# Patient Record
Sex: Male | Born: 1960 | Race: White | Hispanic: No | Marital: Married | Smoking: Former smoker
Health system: Southern US, Community
[De-identification: ages and names within clinical notes are randomized; demographics above are authoritative.]

## PROBLEM LIST (undated history)

## (undated) DIAGNOSIS — G4733 Obstructive sleep apnea (adult) (pediatric): Secondary | ICD-10-CM

## (undated) DIAGNOSIS — Z8679 Personal history of other diseases of the circulatory system: Secondary | ICD-10-CM

## (undated) DIAGNOSIS — I471 Supraventricular tachycardia, unspecified: Secondary | ICD-10-CM

## (undated) DIAGNOSIS — I639 Cerebral infarction, unspecified: Secondary | ICD-10-CM

## (undated) DIAGNOSIS — K219 Gastro-esophageal reflux disease without esophagitis: Secondary | ICD-10-CM

## (undated) DIAGNOSIS — I2510 Atherosclerotic heart disease of native coronary artery without angina pectoris: Secondary | ICD-10-CM

## (undated) DIAGNOSIS — I10 Essential (primary) hypertension: Secondary | ICD-10-CM

## (undated) HISTORY — DX: Obstructive sleep apnea (adult) (pediatric): G47.33

## (undated) HISTORY — PX: UMBILICAL HERNIA REPAIR: SHX196

## (undated) HISTORY — DX: Personal history of other diseases of the circulatory system: Z86.79

## (undated) HISTORY — DX: Supraventricular tachycardia, unspecified: I47.10

## (undated) HISTORY — DX: Atherosclerotic heart disease of native coronary artery without angina pectoris: I25.10

## (undated) HISTORY — DX: Essential (primary) hypertension: I10

## (undated) HISTORY — DX: Supraventricular tachycardia: I47.1

---

## 2010-02-23 NOTE — H&P (Signed)
  NAME:  Dean Wilson, Dean Wilson           ACCOUNT NO.:  [id number]  MEDICAL RECORD NO.:  [id number]        PATIENT TYPE:  PAMB  LOCATION:  DAY                           FACILITY:  [facility]  PHYSICIAN:  Dalia Heading, M.D.  DATE OF BIRTH:  Sep 12, 1960  DATE OF ADMISSION: DATE OF DISCHARGE:  LH                             HISTORY & PHYSICAL   CHIEF COMPLAINT:  Umbilical hernia.  HISTORY OF PRESENT ILLNESS:  The patient is a 50 year old white male who is referred for evaluation and treatment of umbilical hernia.  It has been present for several months, but is increasing in size and is causing him discomfort.  It is made worse with straining.  PAST MEDICAL HISTORY:  Includes hypertension and arthritis.  PAST SURGICAL HISTORY:  Unremarkable.  CURRENT MEDICATIONS: 1. Bystolic 10 mg p.o. daily. 2. Cardizem LA 360 mg p.o. daily. 3. Viagra p.r.n. 4. Relafen p.r.n. 5. Methylprednisolone injections p.r.n.  ALLERGIES:  No known drug allergies.  REVIEW OF SYSTEMS:  The patient denies drinking or smoking.  Denies any other cardiopulmonary difficulties or bleeding disorders.  PHYSICAL EXAMINATION:  GENERAL:  The patient is an overweight white male in no acute distress. HEENT:  Unremarkable. LUNGS:  Clear to auscultation with equal breath sounds bilaterally. HEART:  Reveals regular rate and rhythm without S3, S4, or murmurs. ABDOMEN:  Soft, nontender, nondistended.  A reducible umbilical hernia is seen.  No hepatosplenomegaly or masses are noted.  IMPRESSION:  Umbilical hernia.  PLAN:  The patient is scheduled for an umbilical herniorrhaphy with mesh on March 03, 2010.  The risks and benefits of the procedure including bleeding, infection, the possibility of recurrence of the hernia were fully explained to the patient, gave informed consent.     Dalia Heading, M.D.     MAJ/MEDQ  D:  02/21/2010  T:  02/22/2010  Job:  [id number]  cc:   Short Stay, [facility]  Doreen Beam, MD Fax:  [phone]  Electronically Signed by Franky Macho M.D. on 02/23/2010 12:14:28 PM

## 2010-02-28 LAB — BASIC METABOLIC PANEL
CO2: 24 mEq/L (ref 19–32)
Calcium: 9.1 mg/dL (ref 8.4–10.5)
Chloride: 96 mEq/L (ref 96–112)
Creatinine, Ser: 1.27 mg/dL (ref 0.4–1.5)
GFR calc Af Amer: >60 mL/min (ref >60)
Sodium: 132 mEq/L — ABNORMAL LOW (ref 135–145)

## 2010-02-28 LAB — SURGICAL PCR SCREEN
MRSA, PCR: NEGATIVE
Staphylococcus aureus: NEGATIVE

## 2010-02-28 LAB — CBC
Hemoglobin: 15.1 g/dL (ref 13.0–17.0)
MCH: 28.5 pg (ref 26.0–34.0)
Platelets: 266 10*3/uL (ref 150–400)
RBC: 5.3 MIL/uL (ref 4.22–5.81)
WBC: 8.3 10*3/uL (ref 4.0–10.5)

## 2010-03-03 ENCOUNTER — Ambulatory Visit (HOSPITAL_COMMUNITY)
Admission: RE | Admit: 2010-03-03 | Discharge: 2010-03-03 | Payer: Self-pay | Source: Home / Self Care | Attending: General Surgery | Admitting: General Surgery

## 2010-03-07 NOTE — Op Note (Signed)
  NAME:  Dean Wilson, Dean Wilson           ACCOUNT NO.:  [id number]  MEDICAL RECORD NO.:  [id number]          PATIENT TYPE:  AMB  LOCATION:  DAY                           FACILITY:  [facility]  PHYSICIAN:  Dalia Heading, M.D.  DATE OF BIRTH:  Feb 06, 1961  DATE OF PROCEDURE:  03/03/2010 DATE OF DISCHARGE:                              OPERATIVE REPORT   PREOPERATIVE DIAGNOSIS:  Umbilical hernia.  POSTOPERATIVE DIAGNOSIS:  Umbilical hernia.  PROCEDURE:  Umbilical herniorrhaphy with mesh.  SURGEON:  Dalia Heading, MD  ANESTHESIA:  General endotracheal.  INDICATIONS:  The patient is a 50 year old white male presents with symptomatic umbilical hernia.  The risks and benefits of the procedure including bleeding, infection, and the possibly recurrence of the hernia were fully explained to the patient, gave informed consent.  PROCEDURE NOTE:  The patient was placed in the supine position.  After induction of general endotracheal anesthesia, the abdomen was prepped and draped using the usual sterile technique with ChloraPrep.  Surgical site confirmation was performed.  An infraumbilical incision was made down to the fascia.  The umbilicus was freed away from the underlying hernia.  Omentum was noted within the defect.  A portion of the omentum was excised in order to facilitate reduction.  The remaining omentum was reduced.  A Proceed ventral patch was then inserted and secured to the fascia using 0 Ethibond interrupted sutures.  Some loose overlying fascia was also reapproximated over the repair using 0 Ethibond interrupted sutures.  The base of the umbilicus was secured back to the fascia using a 2-0 Vicryl interrupted suture. The subcutaneous layer was reapproximated using a 3-0 Vicryl interrupted suture.  The skin was closed using staples.  A 0.5% Sensorcaine was instilled in the surrounding wound.  Bacitracin ointment was then applied.  All tape and needle counts were correct at the end  of the procedure. The patient was extubated in the operating room and went back to recovery room awake in stable condition.  Complications none.  SPECIMEN:  None.  BLOOD LOSS:  Minimal.     Dalia Heading, M.D.     MAJ/MEDQ  D:  03/03/2010  T:  03/03/2010  Job:  [id number]  cc:   Dr. Barnett Abu, [facility]  Electronically Signed by Franky Macho M.D. on 03/07/2010 09:16:44 AM

## 2011-01-03 ENCOUNTER — Other Ambulatory Visit (INDEPENDENT_AMBULATORY_CARE_PROVIDER_SITE_OTHER): Payer: Self-pay | Admitting: *Deleted

## 2011-01-03 ENCOUNTER — Encounter (INDEPENDENT_AMBULATORY_CARE_PROVIDER_SITE_OTHER): Payer: Self-pay | Admitting: *Deleted

## 2011-01-03 DIAGNOSIS — Z800 Family history of malignant neoplasm of digestive organs: Secondary | ICD-10-CM

## 2011-01-03 DIAGNOSIS — K219 Gastro-esophageal reflux disease without esophagitis: Secondary | ICD-10-CM

## 2011-01-03 DIAGNOSIS — Z1211 Encounter for screening for malignant neoplasm of colon: Secondary | ICD-10-CM

## 2011-01-08 ENCOUNTER — Telehealth (INDEPENDENT_AMBULATORY_CARE_PROVIDER_SITE_OTHER): Payer: Self-pay | Admitting: *Deleted

## 2011-01-08 NOTE — Telephone Encounter (Signed)
PCP/Requesting MD:  VYAS  Name: CLIMMIE BUELOW  DOB: 12/19/2060  Home Phone: [phone]      Procedure: TCS/EGD/ED  Reason/Indication:  SCREENING, + FH CRC, DYSPHAGIA, GERD  Has patient had this procedure before?  NO  If so, when, by whom and where?    Is there a family history of colon cancer?  YES  Who?  What age when diagnosed?  UNCLE  Is patient diabetic?   NO      Does patient have prosthetic heart valve?  NO  Do you have a pacemaker?  NO  Has patient had joint replacement within last 12 months?  NO  Is patient on Coumadin, Plavix and/or Aspirin? NO  Medications: OMEPRAZOLE 40 MG DAILY, CARDIZEM 360 MG DAILY, BYSTOLIC 10 MG DAILY, VIAGRA 50 MG  Allergies: NKDA  Pharmacy:   Medication Adjustment:   Procedure date & time: 01/26/11 @ 9:30

## 2011-01-08 NOTE — Telephone Encounter (Signed)
Agree 

## 2011-01-15 ENCOUNTER — Encounter (HOSPITAL_COMMUNITY): Payer: Self-pay | Admitting: Pharmacy Technician

## 2011-01-25 MED ORDER — SODIUM CHLORIDE 0.45 % IV SOLN
Freq: Once | INTRAVENOUS | Status: AC
  Administered 2011-01-26: 09:00:00 via INTRAVENOUS

## 2011-01-26 ENCOUNTER — Other Ambulatory Visit (INDEPENDENT_AMBULATORY_CARE_PROVIDER_SITE_OTHER): Payer: Self-pay | Admitting: Internal Medicine

## 2011-01-26 ENCOUNTER — Ambulatory Visit (HOSPITAL_COMMUNITY)
Admission: RE | Admit: 2011-01-26 | Discharge: 2011-01-26 | Disposition: A | Discharge status: Home / Self Care | Payer: BC Managed Care – PPO | Source: Ambulatory Visit | Attending: Internal Medicine | Admitting: Internal Medicine

## 2011-01-26 ENCOUNTER — Encounter (HOSPITAL_COMMUNITY): Payer: Self-pay

## 2011-01-26 ENCOUNTER — Encounter (HOSPITAL_COMMUNITY)
Admission: RE | Disposition: A | Discharge status: Home / Self Care | Payer: Self-pay | Source: Ambulatory Visit | Attending: Internal Medicine

## 2011-01-26 DIAGNOSIS — Z800 Family history of malignant neoplasm of digestive organs: Secondary | ICD-10-CM

## 2011-01-26 DIAGNOSIS — R1310 Dysphagia, unspecified: Secondary | ICD-10-CM | POA: Insufficient documentation

## 2011-01-26 DIAGNOSIS — K222 Esophageal obstruction: Secondary | ICD-10-CM

## 2011-01-26 DIAGNOSIS — D126 Benign neoplasm of colon, unspecified: Secondary | ICD-10-CM | POA: Insufficient documentation

## 2011-01-26 DIAGNOSIS — K2970 Gastritis, unspecified, without bleeding: Secondary | ICD-10-CM

## 2011-01-26 DIAGNOSIS — D131 Benign neoplasm of stomach: Secondary | ICD-10-CM

## 2011-01-26 DIAGNOSIS — K2990 Gastroduodenitis, unspecified, without bleeding: Secondary | ICD-10-CM

## 2011-01-26 DIAGNOSIS — Z1211 Encounter for screening for malignant neoplasm of colon: Secondary | ICD-10-CM

## 2011-01-26 DIAGNOSIS — I10 Essential (primary) hypertension: Secondary | ICD-10-CM | POA: Insufficient documentation

## 2011-01-26 DIAGNOSIS — K2270 Barrett's esophagus without dysplasia: Secondary | ICD-10-CM

## 2011-01-26 DIAGNOSIS — K219 Gastro-esophageal reflux disease without esophagitis: Secondary | ICD-10-CM

## 2011-01-26 DIAGNOSIS — Z79899 Other long term (current) drug therapy: Secondary | ICD-10-CM | POA: Insufficient documentation

## 2011-01-26 DIAGNOSIS — K5730 Diverticulosis of large intestine without perforation or abscess without bleeding: Secondary | ICD-10-CM | POA: Insufficient documentation

## 2011-01-26 HISTORY — DX: Gastro-esophageal reflux disease without esophagitis: K21.9

## 2011-01-26 SURGERY — COLONOSCOPY WITH ESOPHAGOGASTRODUODENOSCOPY (EGD)
Anesthesia: Moderate Sedation

## 2011-01-26 MED ORDER — PANTOPRAZOLE SODIUM 40 MG PO TBEC: 40.0000 mg | DELAYED_RELEASE_TABLET | Freq: Every day | ORAL | Status: AC

## 2011-01-26 MED ORDER — MEPERIDINE HCL 50 MG/ML IJ SOLN
INTRAMUSCULAR | Status: AC
  Filled 2011-01-26: qty 1

## 2011-01-26 MED ORDER — MIDAZOLAM HCL 5 MG/5ML IJ SOLN
INTRAMUSCULAR | Status: DC | PRN
  Administered 2011-01-26 (×4): 2 mg via INTRAVENOUS

## 2011-01-26 MED ORDER — MIDAZOLAM HCL 5 MG/5ML IJ SOLN
INTRAMUSCULAR | Status: AC
  Filled 2011-01-26: qty 10

## 2011-01-26 MED ORDER — BUTAMBEN-TETRACAINE-BENZOCAINE 2-2-14 % EX AERO
INHALATION_SPRAY | CUTANEOUS | Status: DC | PRN
  Administered 2011-01-26: 2 via TOPICAL

## 2011-01-26 MED ORDER — STERILE WATER FOR IRRIGATION IR SOLN
Status: DC | PRN
  Administered 2011-01-26: 10:00:00

## 2011-01-26 MED ORDER — MEPERIDINE HCL 50 MG/ML IJ SOLN
INTRAMUSCULAR | Status: DC | PRN
  Administered 2011-01-26 (×2): 25 mg via INTRAVENOUS

## 2011-01-26 NOTE — Op Note (Addendum)
EGD PROCEDURE REPORT  PATIENT:  Dean Wilson  MR#:  [id number] Birthdate:  02-14-1960, 50 y.o., male Endoscopist:  Dr. Malissa Hippo, MD Referred By:  Dr. Ignatius Specking, MD Procedure Date: 01/26/2011  Procedure:   EGD,ED & Colonoscopy  Indications:  Patient is 50 year old Caucasian male with chronic GERD who presents for solid food dysphagia 7-8 months. He is also undergoing screening colonoscopy. Family history is positive for colon carcinoma in a second-degree relative.            Informed Consent:  Procedures and risks were reviewed with informed consent. Medications:  Demerol 50 mg IV Versed 8 mg IV Cetacaine spray topically for oropharyngeal anesthesia  EGD  Description of procedure:  The endoscope was introduced through the mouth and advanced to the second portion of the duodenum without difficulty or limitations. The mucosal surfaces were surveyed very carefully during advancement of the scope and upon withdrawal.  Findings:  Esophagus:  Mucosa of the proximal segment was normal. Scarring noted 2 mucosa at body. 3 cm long tubular Barrett's mucosa with proximal margin at 37 cm from the incisors. Tubular stricture involving distal 2 cm of the esophagus. GE junction at 40 cm and hiatus at 42. Small polyp noted involving herniated part of the stomach. GEJ:  40 cm Hiatus:  42 cm Stomach:  Stomach was empty and distended very well with insufflation. Folds in the proximal stomach were normal. Examination of mucosa revealed patchy erythema at antrum in edema to prepyloric folds. Angularis fundus and cardia were examined by retroflexing the scope and were normal. Pyloric channel was patent. Duodenum:  Bulbar and post bulbar mucosa was normal.  Therapeutic/Diagnostic Maneuvers Performed:  Distal esophageal stricture was dilated with a balloon from 15-16.5 mm resulting in mucosal disruption.Polyp was biopsied for histology. Biopsy was also taken from Barrett's esophagus.   COLONOSCOPY Description of procedure:  After a digital rectal exam was performed, that colonoscope was advanced from the anus through the rectum and colon to the area of the cecum, ileocecal valve and appendiceal orifice. The cecum was deeply intubated. These structures were well-seen and photographed for the record. From the level of the cecum and ileocecal valve, the scope was slowly and cautiously withdrawn. The mucosal surfaces were carefully surveyed utilizing scope tip to flexion to facilitate fold flattening as needed. The scope was pulled down into the rectum where a thorough exam including retroflexion was performed.  Findings:   Prep excellent. Few small diverticula at sigmoid colon. 3 mm polyp ablated via cold biopsy from proximal sigmoid colon. Unremarkable anorectal junction.  Therapeutic/Diagnostic Maneuvers Performed:  see above  Complications:  None  Cecal Withdrawal Time:  8 minutes  Impression:  3 cm long tubular Barrett's. Biopsy  taken for histology. 2 cm long distal esophageal stricture dilated with a balloon to 16.5 mm. Small polyp involving herniated part of the stomach biopsy taken for histology. Antral gastritis. Few small diverticula at sigmoid colon. 3 mm polyp ablated via cold biopsy from sigmoid colon. Recommendations:  Anti-reflux measures reinforced. Pantoprazole 40 mg by mouth every morning Patient must lose weight. Audi contacting patient with results of biopsy and further recommendations to  REHMAN,NAJEEB U  01/26/2011 10:12 AM  CC: Dr. No primary provider on file. & Dr. No ref. provider found

## 2011-01-26 NOTE — H&P (Signed)
Dean Wilson is an 50 y.o. male.   Chief Complaint: Patient is here for EGD, ED and colonoscopy. HPI: Patient is 50 year old Caucasian male was in experienced dysphagia to solids for the last 7-8 months. He's been on therapy for GERD for about 3 years and  heartburn has been well controlled. He denies melena or rectal bleeding or change in his bowel habits. History is positive for colon carcinoma in paternal father in his 98s he does not have any other family member CRC   Past Medical History  Diagnosis Date  . Dysrhythmia   . Hypertension     Past Surgical History  Procedure Date  . Hernia repair     umbilical hernia repair    Family History  Problem Relation Age of Onset  . Anesthesia problems Neg Hx   . Hypotension Neg Hx   . Malignant hyperthermia Neg Hx   . Pseudochol deficiency Neg Hx    Social History:  reports that he quit smoking about 3 years ago. His smoking use included Cigarettes. He has a 50 pack-year smoking history. He does not have any smokeless tobacco history on file. He reports that he does not drink alcohol or use illicit drugs.  Allergies: No Known Allergies  Medications Prior to Admission  Medication Dose Route Frequency Provider Last Rate Last Dose  . 0.45 % sodium chloride infusion   Intravenous Once Malissa Hippo, MD 20 mL/hr at 01/26/11 0855    . meperidine (DEMEROL) 50 MG/ML injection           . midazolam (VERSED) 5 MG/5ML injection            Medications Prior to Admission  Medication Sig Dispense Refill  . diltiazem (TIAZAC) 360 MG 24 hr capsule Take 360 mg by mouth daily.        . piroxicam (FELDENE) 20 MG capsule Take 20 mg by mouth daily.          No results found for this or any previous visit (from the past 48 hour(s)). No results found.  Review of Systems  Constitutional: Negative for weight loss.  Gastrointestinal: Negative for nausea, vomiting, abdominal pain, diarrhea, constipation, blood in stool and melena.    Blood  pressure 148/94, pulse 64, temperature 98.2 F (36.8 C), temperature source Oral, resp. rate 20, height 6' (1.829 m), weight 240 lb (108.863 kg), SpO2 95.00%. Physical Exam  Constitutional: He appears well-developed and well-nourished.  Eyes: Conjunctivae are normal. No scleral icterus.  Neck: No thyromegaly present.  Cardiovascular: Normal rate, regular rhythm and normal heart sounds.   No murmur heard. Respiratory: Effort normal and breath sounds normal.  GI: Soft. He exhibits no distension and no mass. There is no tenderness.  Musculoskeletal: He exhibits no edema.  Lymphadenopathy:    He has no cervical adenopathy.  Neurological: He is alert.  Skin: Skin is warm and dry.     Assessment/Plan Solid food dysphagia in a patient with history of chronic GERD. EGD, ED and Screening colonoscopy  Jemarion Roycroft U 01/26/2011, 9:30 AM

## 2011-02-08 ENCOUNTER — Telehealth (INDEPENDENT_AMBULATORY_CARE_PROVIDER_SITE_OTHER): Payer: Self-pay | Admitting: *Deleted

## 2011-02-08 NOTE — Telephone Encounter (Signed)
Per Dr. Karilyn Cota the patient should be on a bland diet, no fried foods, use anti reflux measures. Take the Pantoprazole 40 mg twice a day. Dr. Karilyn Cota plans to repeat EGD/ED  in February 2013. The patient was called and made aware.Dr. Karilyn Cota did say that if the patient need Pantoprazole we could call in the following: Pantoprazole 40 mg Take 1 by mouth twice a day #60  - 6 refills.

## 2011-02-08 NOTE — Telephone Encounter (Signed)
Patient called me this morning stating that last evening he had a episode worse than he ever has had.  He ate a whopper and fries with some ketchup last evening.  Went to bed and woke up with terrible reflux and a feeling he like he  could not breath.  Had a terrible taste from reflux in his mouth.  He said he felt his swallowing was somewhat difficult but no like before where he would feel he would need to throw up.  He said he needed relief so he did disolve baking soda in some water and did get some relief and was then able to go back to sleep afterwards.    He did not have any abdominal pain or vomiting.  nkda and uses [facility]

## 2011-02-08 NOTE — Telephone Encounter (Signed)
Per the patient he is  only been taking the PPI once a day. I called a new prescription into [facility]/ [place] as noted in previous telephone encounter.

## 2011-02-08 NOTE — Telephone Encounter (Signed)
Noted, I will address with Dr. Karilyn Cota and then call the patient back.

## 2011-02-09 ENCOUNTER — Encounter (INDEPENDENT_AMBULATORY_CARE_PROVIDER_SITE_OTHER): Payer: Self-pay | Admitting: *Deleted

## 2011-04-03 ENCOUNTER — Encounter (INDEPENDENT_AMBULATORY_CARE_PROVIDER_SITE_OTHER): Payer: Self-pay | Admitting: *Deleted

## 2016-02-06 ENCOUNTER — Encounter (HOSPITAL_COMMUNITY): Payer: Self-pay | Admitting: Emergency Medicine

## 2016-02-06 ENCOUNTER — Emergency Department (HOSPITAL_COMMUNITY): Payer: BLUE CROSS/BLUE SHIELD

## 2016-02-06 ENCOUNTER — Emergency Department (HOSPITAL_COMMUNITY)
Admission: EM | Admit: 2016-02-06 | Discharge: 2016-02-06 | Disposition: A | Discharge status: Home / Self Care | Payer: BLUE CROSS/BLUE SHIELD | Attending: Emergency Medicine | Admitting: Emergency Medicine

## 2016-02-06 DIAGNOSIS — Z79899 Other long term (current) drug therapy: Secondary | ICD-10-CM | POA: Insufficient documentation

## 2016-02-06 DIAGNOSIS — I10 Essential (primary) hypertension: Secondary | ICD-10-CM | POA: Insufficient documentation

## 2016-02-06 DIAGNOSIS — Z87891 Personal history of nicotine dependence: Secondary | ICD-10-CM | POA: Insufficient documentation

## 2016-02-06 DIAGNOSIS — I471 Supraventricular tachycardia: Secondary | ICD-10-CM

## 2016-02-06 DIAGNOSIS — R079 Chest pain, unspecified: Secondary | ICD-10-CM | POA: Diagnosis present

## 2016-02-06 LAB — CBC WITH DIFFERENTIAL/PLATELET
Basophils Absolute: 0.1 10*3/uL (ref 0.0–0.1)
Basophils Relative: 1 %
Eosinophils Absolute: 0.3 10*3/uL (ref 0.0–0.7)
Eosinophils Relative: 2 %
HCT: 49.9 % (ref 39.0–52.0)
Hemoglobin: 16.4 g/dL (ref 13.0–17.0)
Lymphocytes Relative: 33 %
Lymphs Abs: 4 10*3/uL (ref 0.7–4.0)
MCH: 28.4 pg (ref 26.0–34.0)
MCHC: 32.9 g/dL (ref 30.0–36.0)
MCV: 86.5 fL (ref 78.0–100.0)
Monocytes Absolute: 1.1 10*3/uL — ABNORMAL HIGH (ref 0.1–1.0)
Monocytes Relative: 10 %
Neutro Abs: 6.4 10*3/uL (ref 1.7–7.7)
Neutrophils Relative %: 54 %
Platelets: 275 10*3/uL (ref 150–400)
RBC: 5.77 MIL/uL (ref 4.22–5.81)
RDW: 15.3 % (ref 11.5–15.5)
WBC: 11.8 10*3/uL — ABNORMAL HIGH (ref 4.0–10.5)

## 2016-02-06 LAB — BASIC METABOLIC PANEL
Anion gap: 13 (ref 5–15)
BUN: 25 mg/dL — ABNORMAL HIGH (ref 6–20)
CO2: 30 mmol/L (ref 22–32)
Calcium: 9.3 mg/dL (ref 8.9–10.3)
Chloride: 94 mmol/L — ABNORMAL LOW (ref 101–111)
Creatinine, Ser: 1.48 mg/dL — ABNORMAL HIGH (ref 0.61–1.24)
GFR calc Af Amer: 59 mL/min — ABNORMAL LOW (ref >60)
GFR calc non Af Amer: 51 mL/min — ABNORMAL LOW (ref >60)
Glucose, Bld: 153 mg/dL — ABNORMAL HIGH (ref 65–99)
Potassium: 2.8 mmol/L — ABNORMAL LOW (ref 3.5–5.1)
Sodium: 137 mmol/L (ref 135–145)

## 2016-02-06 LAB — TROPONIN I: Troponin I: <0.03 ng/mL (ref <0.03)

## 2016-02-06 IMAGING — CR DG CHEST 1V PORT
1 series · 1 of 1 positions shown · non-contrast
Comparison: [DATE]

CLINICAL DATA: hx of a-fib and HTN,---sudden onset, chest pain
which began around 1 hour ago. Pt states he was about to make
breakfast when he suddenly felt a sharp pain to his chest. Pt also
reports diaphoresis and SOB.

EXAM:
PORTABLE CHEST 1 VIEW

[portable]
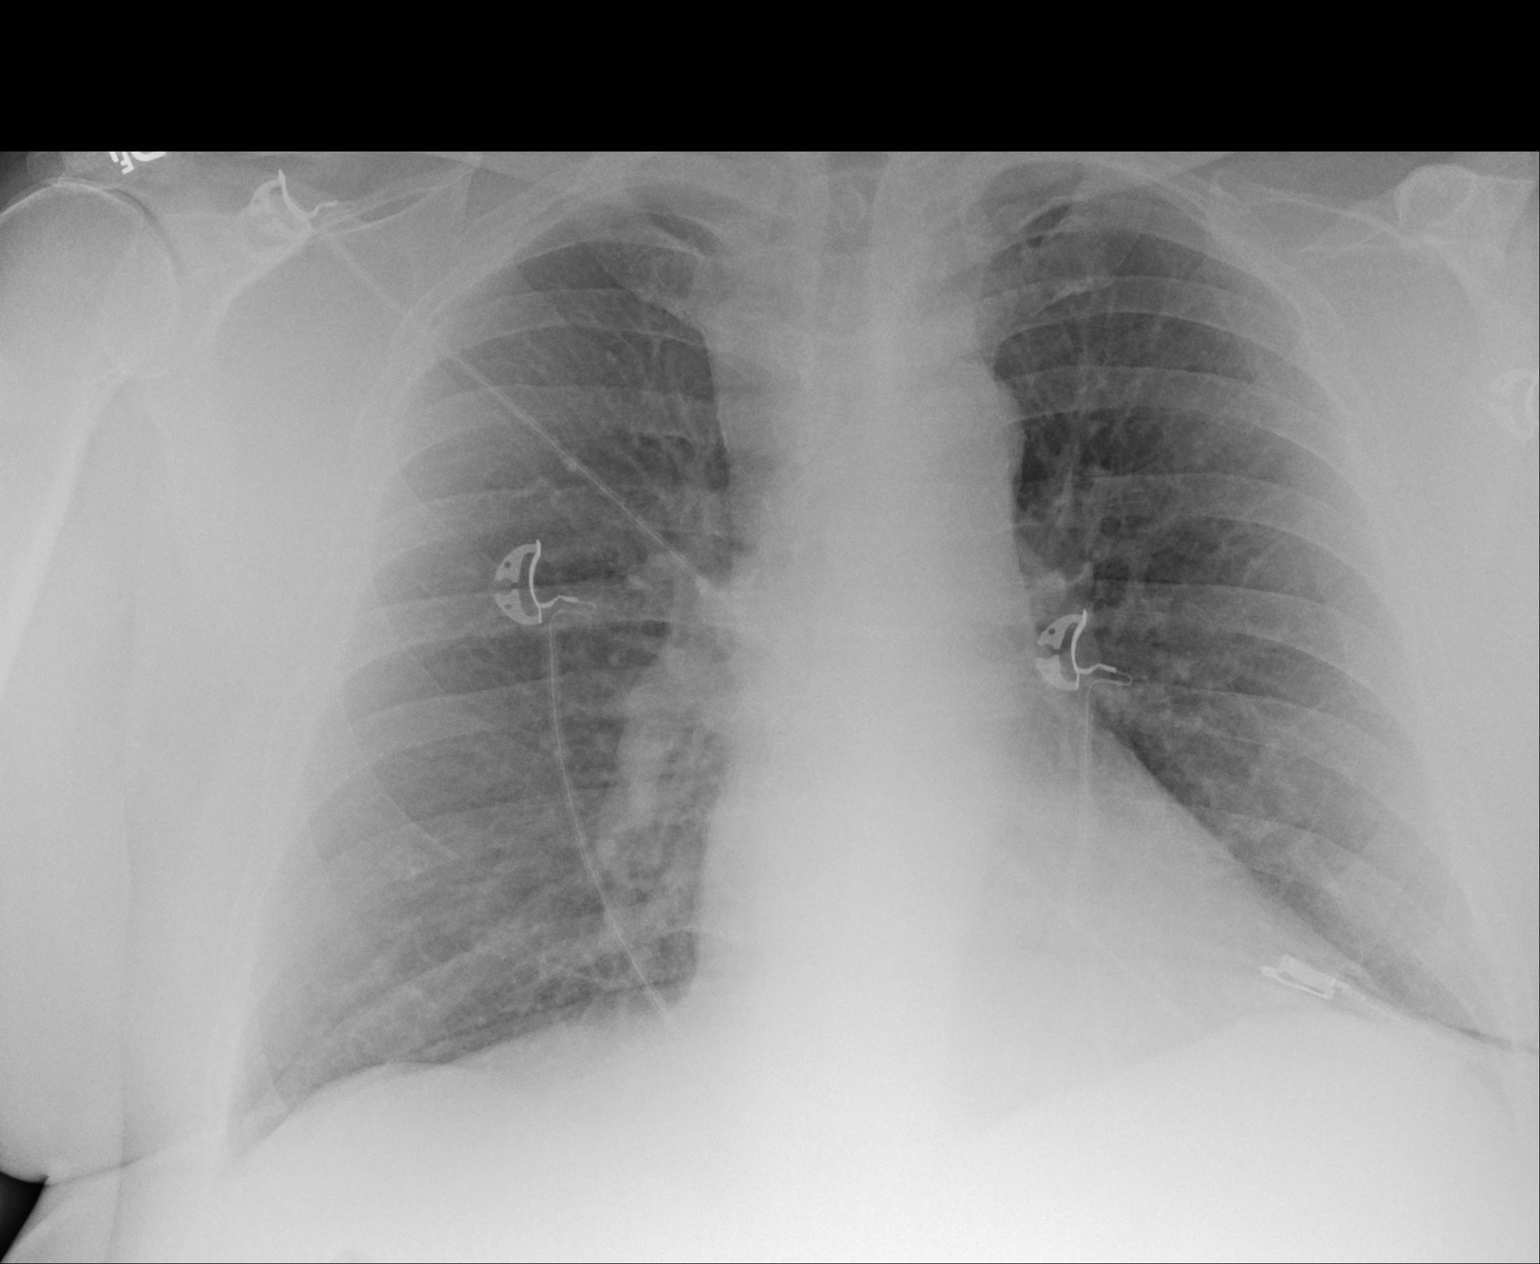

[1 of 1 positions shown; findings below may reference images not displayed]

FINDINGS: Heart size is normal and accentuated by the portable AP technique.
Lungs are free of focal consolidations and pleural effusions. No
pulmonary edema.
IMPRESSION: No active disease.

## 2016-02-06 MED ORDER — POTASSIUM CHLORIDE CRYS ER 20 MEQ PO TBCR
60.0000 meq | EXTENDED_RELEASE_TABLET | Freq: Once | ORAL | Status: AC
  Administered 2016-02-06: 60 meq via ORAL
  Filled 2016-02-06: qty 3

## 2016-02-06 MED ORDER — MAGNESIUM OXIDE 400 (241.3 MG) MG PO TABS
800.0000 mg | ORAL_TABLET | Freq: Once | ORAL | Status: AC
  Administered 2016-02-06: 800 mg via ORAL
  Filled 2016-02-06: qty 2

## 2016-02-06 MED ORDER — ADENOSINE 6 MG/2ML IV SOLN
INTRAVENOUS | Status: AC
  Administered 2016-02-06: 6 mg
  Filled 2016-02-06: qty 6

## 2016-02-06 NOTE — ED Notes (Signed)
Pt denies any sx a tthis time. States he feels a lot better.

## 2016-02-06 NOTE — ED Triage Notes (Signed)
Was fixing breakfast and had sudden sharp pain to L chest radiating down L arm. Has been constant with sob. Sob noted. Pt states he was sweating. nondiaphoretc at this time. A/o

## 2016-02-06 NOTE — ED Provider Notes (Signed)
[facility] DEPT Provider Note   CSN: [id number] Arrival date & time: 02/06/16  0825   By signing my name below, I, Delton Prairie, attest that this documentation has been prepared under the direction and in the presence of Virgel Manifold, MD  Electronically Signed: Delton Prairie, ED Scribe. 02/06/16. 8:56 AM.  History   Chief Complaint Chief Complaint  Patient presents with  . Chest Pain   The history is provided by the patient. No language interpreter was used.    HPI Comments:  Dean Wilson is a 56 y.o. male, with a hx of a-fib and HTN, who presents to the Emergency Department complaining of sudden onset, chest pain which began around 1 hour ago. Pt states he was about to make breakfast when he suddenly felt a sharp pain to his chest. Pt also reports diaphoresis and SOB. He has been to the ED about 2 times with similar pain. Pt states he took his medication in the AM today. No alleviating factors noted. Pt denies a hx of cardiac catheterizations, any other associated symptoms and any other modifying factors at this time.    Past Medical History:  Diagnosis Date  . Dysrhythmia   . GERD (gastroesophageal reflux disease)   . Hypertension     Patient Active Problem List   Diagnosis Date Noted  . Gastritis 01/26/2011    Past Surgical History:  Procedure Laterality Date  . HERNIA REPAIR     umbilical hernia repair     Home Medications    Prior to Admission medications   Medication Sig Start Date End Date Taking? Authorizing Provider  diltiazem (TIAZAC) 360 MG 24 hr capsule Take 360 mg by mouth daily.      Historical Provider, MD  pantoprazole (PROTONIX) 40 MG tablet Take 1 tablet (40 mg total) by mouth daily. 01/26/11 01/26/12  Rogene Houston, MD  piroxicam (FELDENE) 20 MG capsule Take 20 mg by mouth daily.      Historical Provider, MD    Family History Family History  Problem Relation Age of Onset  . Anesthesia problems Neg Hx   . Hypotension Neg Hx   .  Malignant hyperthermia Neg Hx   . Pseudochol deficiency Neg Hx     Social History Social History  Substance Use Topics  . Smoking status: Former Smoker    Packs/day: 2.00    Years: 25.00    Types: Cigarettes    Quit date: 01/26/2008  . Smokeless tobacco: Never Used  . Alcohol use No     Allergies   Patient has no known allergies.   Review of Systems Review of Systems  Constitutional: Positive for diaphoresis.  Respiratory: Positive for shortness of breath.   Cardiovascular: Positive for chest pain.  All other systems reviewed and are negative.  Physical Exam Updated Vital Signs BP 104/85 (BP Location: Right Arm)   Pulse 86   Temp 98 F (36.7 C) (Oral)   Resp 14   Ht 6' (1.829 m)   Wt 270 lb (122.5 kg)   SpO2 100%   BMI 36.62 kg/m   Physical Exam  Constitutional: He is oriented to person, place, and time. He appears well-developed and well-nourished. No distress.  Appears mildly uncomfortable   HENT:  Head: Normocephalic and atraumatic.  Eyes: Conjunctivae are normal.  Cardiovascular: Tachycardia present.   Pulmonary/Chest: Effort normal.  Abdominal: He exhibits no distension.  Neurological: He is alert and oriented to person, place, and time.  Skin: Skin is warm and dry.  Psychiatric: He has a normal mood and affect.  Nursing note and vitals reviewed.   ED Treatments / Results  DIAGNOSTIC STUDIES:  Oxygen Saturation is 99% on RA, normal by my interpretation.    COORDINATION OF CARE:  8:50 AM Discussed treatment plan with pt at bedside and pt agreed to plan.  Labs (all labs ordered are listed, but only abnormal results are displayed) Labs Reviewed  CBC WITH DIFFERENTIAL/PLATELET - Abnormal; Notable for the following:       Result Value   WBC 11.8 (*)    Monocytes Absolute 1.1 (*)    All other components within normal limits  BASIC METABOLIC PANEL - Abnormal; Notable for the following:    Potassium 2.8 (*)    Chloride 94 (*)    Glucose, Bld  153 (*)    BUN 25 (*)    Creatinine, Ser 1.48 (*)    GFR calc non Af Amer 51 (*)    GFR calc Af Amer 59 (*)    All other components within normal limits  TROPONIN I    EKG  EKG Interpretation  Date/Time:  Monday February 06 2016 08:47:07 EST Ventricular Rate:  86 PR Interval:    QRS Duration: 102 QT Interval:  364 QTC Calculation: 436 R Axis:   89 Text Interpretation:  Sinus rhythm Atrial premature complex Sinus pause ST depr, consider ischemia, inferior leads Confirmed by Wilson Singer  MD, Krisa Blattner [id number]) on 02/06/2016 9:00:47 AM       Radiology No results found.  Procedures Procedures (including critical care time)  Medications Ordered in ED Medications  adenosine (ADENOCARD) 6 MG/2ML injection (6 mg  Given 02/06/16 0845)  potassium chloride SA (K-DUR,KLOR-CON) CR tablet 60 mEq (60 mEq Oral Given 02/06/16 0935)  magnesium oxide (MAG-OX) tablet 800 mg (800 mg Oral Given 02/06/16 0955)     Initial Impression / Assessment and Plan / ED Course  I have reviewed the triage vital signs and the nursing notes.  Pertinent labs & imaging results that were available during my care of the patient were reviewed by me and considered in my medical decision making (see chart for details).  Clinical Course     55yM with SVT. Converted with adenosine. Symptoms resolved. Potassium supplemented. Outpt cardiology FU. It has been determined that no acute conditions requiring further emergency intervention are present at this time. The patient has been advised of the diagnosis and plan. I reviewed any labs and imaging including any potential incidental findings. We have discussed signs and symptoms that warrant return to the ED and they are listed in the discharge instructions.    Final Clinical Impressions(s) / ED Diagnoses   Final diagnoses:  SVT (supraventricular tachycardia) (HCC)    New Prescriptions New Prescriptions   No medications on file    I personally preformed the services scribed  in my presence. The recorded information has been reviewed is accurate. Virgel Manifold, MD.     Virgel Manifold, MD 02/14/16 [phone]

## 2016-02-06 NOTE — ED Notes (Signed)
edp in and advised pt to bare down

## 2016-02-06 NOTE — ED Notes (Signed)
Pharmacy aware needing magnesium

## 2016-03-09 ENCOUNTER — Encounter: Payer: Self-pay | Admitting: *Deleted

## 2016-03-12 ENCOUNTER — Encounter: Payer: Self-pay | Admitting: Cardiology

## 2016-03-12 ENCOUNTER — Ambulatory Visit: Payer: Self-pay | Admitting: Cardiology

## 2016-03-12 NOTE — Progress Notes (Deleted)
Cardiology Office Note  Date: 03/12/2016   ID: Dean Wilson, DOB 11/04/1960, MRN [id number]  PCP: Glenda Chroman, MD  Consulting Cardiologist: Rozann Lesches, MD   No chief complaint on file.   History of Present Illness: Dean Wilson is a 56 y.o. male referred for cardiology consultation by Dr. Woody Seller. Records indicate ER evaluation in January secondary to symptomatic SVT that converted to sinus rhythm with adenosine per ER staff. He was incidentally noted to be hypokalemic at the time on a diuretic. Medications have been subsequently adjusted.  Past Medical History:  Diagnosis Date  . Essential hypertension   . GERD (gastroesophageal reflux disease)   . PSVT (paroxysmal supraventricular tachycardia) ([place])     Past Surgical History:  Procedure Laterality Date  . UMBILICAL HERNIA REPAIR      Current Outpatient Prescriptions  Medication Sig Dispense Refill  . chlorthalidone (HYGROTON) 25 MG tablet Take 25 mg by mouth daily.    Marland Kitchen diltiazem (TIAZAC) 360 MG 24 hr capsule Take 360 mg by mouth daily.      Marland Kitchen lisinopril (PRINIVIL,ZESTRIL) 20 MG tablet Take 20 mg by mouth daily.    . Multiple Vitamin (MULTIVITAMIN) tablet Take 1 tablet by mouth daily.    . pantoprazole (PROTONIX) 40 MG tablet Take 1 tablet (40 mg total) by mouth daily. (Patient not taking: Reported on 02/06/2016) 30 tablet 11   No current facility-administered medications for this visit.    Allergies:  Banana; Sunflower oil; and Tomato   Social History: The patient  reports that he quit smoking about 8 years ago. His smoking use included Cigarettes. He has a 50.00 pack-year smoking history. He has never used smokeless tobacco. He reports that he drinks alcohol. He reports that he does not use drugs.   Family History: The patient's family history is not on file.   ROS:  Please see the history of present illness. Otherwise, complete review of systems is positive for {NONE DEFAULTED:18576::"none"}.  All  other systems are reviewed and negative.   Physical Exam: VS:  There were no vitals taken for this visit., BMI There is no height or weight on file to calculate BMI.  Wt Readings from Last 3 Encounters:  02/06/16 270 lb (122.5 kg)  01/26/11 240 lb (108.9 kg)    General: Patient appears comfortable at rest. HEENT: Conjunctiva and lids normal, oropharynx clear with moist mucosa. Neck: Supple, no elevated JVP or carotid bruits, no thyromegaly. Lungs: Clear to auscultation, nonlabored breathing at rest. Cardiac: Regular rate and rhythm, no S3 or significant systolic murmur, no pericardial rub. Abdomen: Soft, nontender, no hepatomegaly, bowel sounds present, no guarding or rebound. Extremities: No pitting edema, distal pulses 2+. Skin: Warm and dry. Musculoskeletal: No kyphosis. Neuropsychiatric: Alert and oriented x3, affect grossly appropriate.  ECG: I personally reviewed the tracing from 02/06/2016 which showed SVT at 158 bpm with diffuse ST segment abnormalities, repolarization changes versus ischemic change.  Recent Labwork: 02/06/2016: BUN 25; Creatinine, Ser 1.48; Hemoglobin 16.4; Platelets 275; Potassium 2.8; Sodium 137   Other Studies Reviewed Today:  Chest x-ray 02/06/2016: FINDINGS: Heart size is normal and accentuated by the portable AP technique. Lungs are free of focal consolidations and pleural effusions. No pulmonary edema.  IMPRESSION: No active disease.  Assessment and Plan:     Current medicines were reviewed with the patient today.  No orders of the defined types were placed in this encounter.   Disposition:  Signed, Satira Sark, MD, [facility] 03/12/2016 8:56 AM  [facility] [place], [place], [place] [address] Phone: [phone]; Fax: [phone]

## 2016-03-13 ENCOUNTER — Encounter: Payer: Self-pay | Admitting: Cardiology

## 2019-10-06 ENCOUNTER — Ambulatory Visit: Payer: BLUE CROSS/BLUE SHIELD | Admitting: Urology

## 2019-10-06 NOTE — Progress Notes (Incomplete)
H&P  Chief Complaint: Renal Cyst (L)  History of Present Illness:  8.31.2021:    Past Medical History:  Diagnosis Date  . Essential hypertension   . GERD (gastroesophageal reflux disease)   . PSVT (paroxysmal supraventricular tachycardia) ([place] [place])     Past Surgical History:  Procedure Laterality Date  . UMBILICAL HERNIA REPAIR      Home Medications:  Allergies as of 10/06/2019      Reactions   Banana Itching   Sunflower Oil Itching   Seeds not oil   Tomato Itching      Medication List       Accurate as of October 06, 2019 10:29 AM. If you have any questions, ask your nurse or doctor.        chlorthalidone 25 MG tablet Commonly known as: HYGROTON Take 25 mg by mouth daily.   diltiazem 360 MG 24 hr capsule Commonly known as: TIAZAC Take 360 mg by mouth daily.   lisinopril 20 MG tablet Commonly known as: ZESTRIL Take 20 mg by mouth daily.   multivitamin tablet Take 1 tablet by mouth daily.   pantoprazole 40 MG tablet Commonly known as: Protonix Take 1 tablet (40 mg total) by mouth daily.       Allergies:  Allergies  Allergen Reactions  . Banana Itching  . Sunflower Oil Itching    Seeds not oil  . Tomato Itching    Family History  Problem Relation Age of Onset  . Anesthesia problems Neg Hx   . Hypotension Neg Hx   . Malignant hyperthermia Neg Hx   . Pseudochol deficiency Neg Hx     Social History:  reports that he quit smoking about 11 years ago. His smoking use included cigarettes. He has a 50.00 pack-year smoking history. He has never used smokeless tobacco. He reports current alcohol use. He reports that he does not use drugs.  ROS: A complete review of systems was performed.  All systems are negative except for pertinent findings as noted.  Physical Exam:  Vital signs in last 24 hours: There were no vitals taken for this visit. Constitutional:  Alert and oriented, No acute distress Cardiovascular: Regular rate  Respiratory: Normal  respiratory effort GI: Abdomen is soft, nontender, nondistended, no abdominal masses. No CVAT.  Genitourinary: Normal male phallus, testes are descended bilaterally and non-tender and without masses, scrotum is normal in appearance without lesions or masses, perineum is normal on inspection. Lymphatic: No lymphadenopathy Neurologic: Grossly intact, no focal deficits Psychiatric: Normal mood and affect  Laboratory Data:  No results for input(s): WBC, HGB, HCT, PLT in the last 72 hours.  No results for input(s): NA, K, CL, GLUCOSE, BUN, CALCIUM, CREATININE in the last 72 hours.  Invalid input(s): CO3   No results found for this or any previous visit (from the past 24 hour(s)). No results found for this or any previous visit (from the past 240 hour(s)).  Renal Function: No results for input(s): CREATININE in the last 168 hours. CrCl cannot be calculated (Patient's most recent lab result is older than the maximum 21 days allowed.).  Radiologic Imaging: No results found.  Impression/Assessment:  ***  Plan:  ***

## 2020-05-04 ENCOUNTER — Encounter (HOSPITAL_COMMUNITY): Payer: Self-pay

## 2020-05-04 ENCOUNTER — Emergency Department (HOSPITAL_COMMUNITY): Payer: BC Managed Care – PPO

## 2020-05-04 ENCOUNTER — Inpatient Hospital Stay (HOSPITAL_COMMUNITY)
Admission: EM | Admit: 2020-05-04 | Discharge: 2020-05-07 | DRG: 291 | Disposition: A | Discharge status: Home / Self Care | Payer: BC Managed Care – PPO | Attending: Internal Medicine | Admitting: Internal Medicine

## 2020-05-04 ENCOUNTER — Other Ambulatory Visit: Payer: Self-pay

## 2020-05-04 DIAGNOSIS — Z7982 Long term (current) use of aspirin: Secondary | ICD-10-CM

## 2020-05-04 DIAGNOSIS — Z6835 Body mass index (BMI) 35.0-35.9, adult: Secondary | ICD-10-CM

## 2020-05-04 DIAGNOSIS — I130 Hypertensive heart and chronic kidney disease with heart failure and stage 1 through stage 4 chronic kidney disease, or unspecified chronic kidney disease: Secondary | ICD-10-CM | POA: Diagnosis not present

## 2020-05-04 DIAGNOSIS — E669 Obesity, unspecified: Secondary | ICD-10-CM | POA: Diagnosis present

## 2020-05-04 DIAGNOSIS — R0902 Hypoxemia: Secondary | ICD-10-CM | POA: Diagnosis present

## 2020-05-04 DIAGNOSIS — E876 Hypokalemia: Secondary | ICD-10-CM | POA: Diagnosis present

## 2020-05-04 DIAGNOSIS — Z79899 Other long term (current) drug therapy: Secondary | ICD-10-CM

## 2020-05-04 DIAGNOSIS — E785 Hyperlipidemia, unspecified: Secondary | ICD-10-CM | POA: Diagnosis present

## 2020-05-04 DIAGNOSIS — Z8249 Family history of ischemic heart disease and other diseases of the circulatory system: Secondary | ICD-10-CM

## 2020-05-04 DIAGNOSIS — I252 Old myocardial infarction: Secondary | ICD-10-CM

## 2020-05-04 DIAGNOSIS — Z20822 Contact with and (suspected) exposure to covid-19: Secondary | ICD-10-CM | POA: Diagnosis present

## 2020-05-04 DIAGNOSIS — E875 Hyperkalemia: Secondary | ICD-10-CM | POA: Diagnosis present

## 2020-05-04 DIAGNOSIS — Z7902 Long term (current) use of antithrombotics/antiplatelets: Secondary | ICD-10-CM

## 2020-05-04 DIAGNOSIS — I161 Hypertensive emergency: Secondary | ICD-10-CM | POA: Diagnosis present

## 2020-05-04 DIAGNOSIS — I5021 Acute systolic (congestive) heart failure: Secondary | ICD-10-CM | POA: Diagnosis present

## 2020-05-04 DIAGNOSIS — Z800 Family history of malignant neoplasm of digestive organs: Secondary | ICD-10-CM

## 2020-05-04 DIAGNOSIS — I69322 Dysarthria following cerebral infarction: Secondary | ICD-10-CM

## 2020-05-04 DIAGNOSIS — I509 Heart failure, unspecified: Secondary | ICD-10-CM | POA: Diagnosis not present

## 2020-05-04 DIAGNOSIS — G4734 Idiopathic sleep related nonobstructive alveolar hypoventilation: Secondary | ICD-10-CM | POA: Diagnosis present

## 2020-05-04 DIAGNOSIS — R0600 Dyspnea, unspecified: Secondary | ICD-10-CM

## 2020-05-04 DIAGNOSIS — Z91018 Allergy to other foods: Secondary | ICD-10-CM

## 2020-05-04 DIAGNOSIS — N182 Chronic kidney disease, stage 2 (mild): Secondary | ICD-10-CM | POA: Diagnosis present

## 2020-05-04 DIAGNOSIS — Z825 Family history of asthma and other chronic lower respiratory diseases: Secondary | ICD-10-CM

## 2020-05-04 DIAGNOSIS — G4733 Obstructive sleep apnea (adult) (pediatric): Secondary | ICD-10-CM | POA: Diagnosis present

## 2020-05-04 DIAGNOSIS — K219 Gastro-esophageal reflux disease without esophagitis: Secondary | ICD-10-CM | POA: Diagnosis present

## 2020-05-04 DIAGNOSIS — I340 Nonrheumatic mitral (valve) insufficiency: Secondary | ICD-10-CM | POA: Diagnosis present

## 2020-05-04 HISTORY — DX: Cerebral infarction, unspecified: I63.9

## 2020-05-04 LAB — CBC WITH DIFFERENTIAL/PLATELET
Abs Immature Granulocytes: 0.03 10*3/uL (ref 0.00–0.07)
Basophils Absolute: 0.1 10*3/uL (ref 0.0–0.1)
Basophils Relative: 1 %
Eosinophils Absolute: 0.4 10*3/uL (ref 0.0–0.5)
Eosinophils Relative: 5 %
HCT: 48.3 % (ref 39.0–52.0)
Hemoglobin: 16.2 g/dL (ref 13.0–17.0)
Immature Granulocytes: 0 %
Lymphocytes Relative: 30 %
Lymphs Abs: 2.3 10*3/uL (ref 0.7–4.0)
MCH: 28.8 pg (ref 26.0–34.0)
MCHC: 33.5 g/dL (ref 30.0–36.0)
MCV: 85.8 fL (ref 80.0–100.0)
Monocytes Absolute: 0.9 10*3/uL (ref 0.1–1.0)
Monocytes Relative: 11 %
Neutro Abs: 4.1 10*3/uL (ref 1.7–7.7)
Neutrophils Relative %: 53 %
Platelets: 238 10*3/uL (ref 150–400)
RBC: 5.63 MIL/uL (ref 4.22–5.81)
RDW: 14.9 % (ref 11.5–15.5)
WBC: 7.7 10*3/uL (ref 4.0–10.5)
nRBC: 0 % (ref 0.0–0.2)

## 2020-05-04 LAB — CBC
HCT: 47.8 % (ref 39.0–52.0)
Hemoglobin: 15.9 g/dL (ref 13.0–17.0)
MCH: 28.2 pg (ref 26.0–34.0)
MCHC: 33.3 g/dL (ref 30.0–36.0)
MCV: 84.9 fL (ref 80.0–100.0)
Platelets: 251 10*3/uL (ref 150–400)
RBC: 5.63 MIL/uL (ref 4.22–5.81)
RDW: 15.1 % (ref 11.5–15.5)
WBC: 8.9 10*3/uL (ref 4.0–10.5)
nRBC: 0 % (ref 0.0–0.2)

## 2020-05-04 LAB — HEPATIC FUNCTION PANEL
ALT: 21 U/L (ref 0–44)
AST: 22 U/L (ref 15–41)
Albumin: 3.8 g/dL (ref 3.5–5.0)
Alkaline Phosphatase: 55 U/L (ref 38–126)
Bilirubin, Direct: 0.1 mg/dL (ref 0.0–0.2)
Indirect Bilirubin: 0.7 mg/dL (ref 0.3–0.9)
Total Bilirubin: 0.8 mg/dL (ref 0.3–1.2)
Total Protein: 6.7 g/dL (ref 6.5–8.1)

## 2020-05-04 LAB — BASIC METABOLIC PANEL
Anion gap: 9 (ref 5–15)
BUN: 24 mg/dL — ABNORMAL HIGH (ref 8–23)
CO2: 31 mmol/L (ref 22–32)
Calcium: 9.3 mg/dL (ref 8.9–10.3)
Chloride: 101 mmol/L (ref 98–111)
Creatinine, Ser: 1.37 mg/dL — ABNORMAL HIGH (ref 0.61–1.24)
GFR, Estimated: 59 mL/min — ABNORMAL LOW (ref >60)
Glucose, Bld: 117 mg/dL — ABNORMAL HIGH (ref 70–99)
Potassium: 3.1 mmol/L — ABNORMAL LOW (ref 3.5–5.1)
Sodium: 141 mmol/L (ref 135–145)

## 2020-05-04 LAB — PHOSPHORUS: Phosphorus: 3.1 mg/dL (ref 2.5–4.6)

## 2020-05-04 LAB — TROPONIN I (HIGH SENSITIVITY)
Troponin I (High Sensitivity): 24 ng/L — ABNORMAL HIGH (ref <18)
Troponin I (High Sensitivity): 21 ng/L — ABNORMAL HIGH (ref <18)

## 2020-05-04 LAB — D-DIMER, QUANTITATIVE: D-Dimer, Quant: 0.46 ug/mL-FEU (ref 0.00–0.50)

## 2020-05-04 LAB — HIV ANTIBODY (ROUTINE TESTING W REFLEX): HIV Screen 4th Generation wRfx: NONREACTIVE

## 2020-05-04 LAB — RESP PANEL BY RT-PCR (FLU A&B, COVID) ARPGX2
Influenza A by PCR: NEGATIVE
Influenza B by PCR: NEGATIVE
SARS Coronavirus 2 by RT PCR: NEGATIVE

## 2020-05-04 LAB — CREATININE, SERUM
Creatinine, Ser: 1.35 mg/dL — ABNORMAL HIGH (ref 0.61–1.24)
GFR, Estimated: 60 mL/min — ABNORMAL LOW (ref >60)

## 2020-05-04 LAB — CREATININE, URINE, RANDOM: Creatinine, Urine: 119.78 mg/dL

## 2020-05-04 LAB — SODIUM, URINE, RANDOM: Sodium, Ur: 131 mmol/L

## 2020-05-04 LAB — MAGNESIUM: Magnesium: 2 mg/dL (ref 1.7–2.4)

## 2020-05-04 LAB — BRAIN NATRIURETIC PEPTIDE: B Natriuretic Peptide: 329.1 pg/mL — ABNORMAL HIGH (ref 0.0–100.0)

## 2020-05-04 IMAGING — DX DG CHEST 2V
2 series · 2 of 2 positions shown · non-contrast
Comparison: [DATE]

CLINICAL DATA: Shortness of breath

EXAM:
CHEST - 2 VIEW

[chest lat]
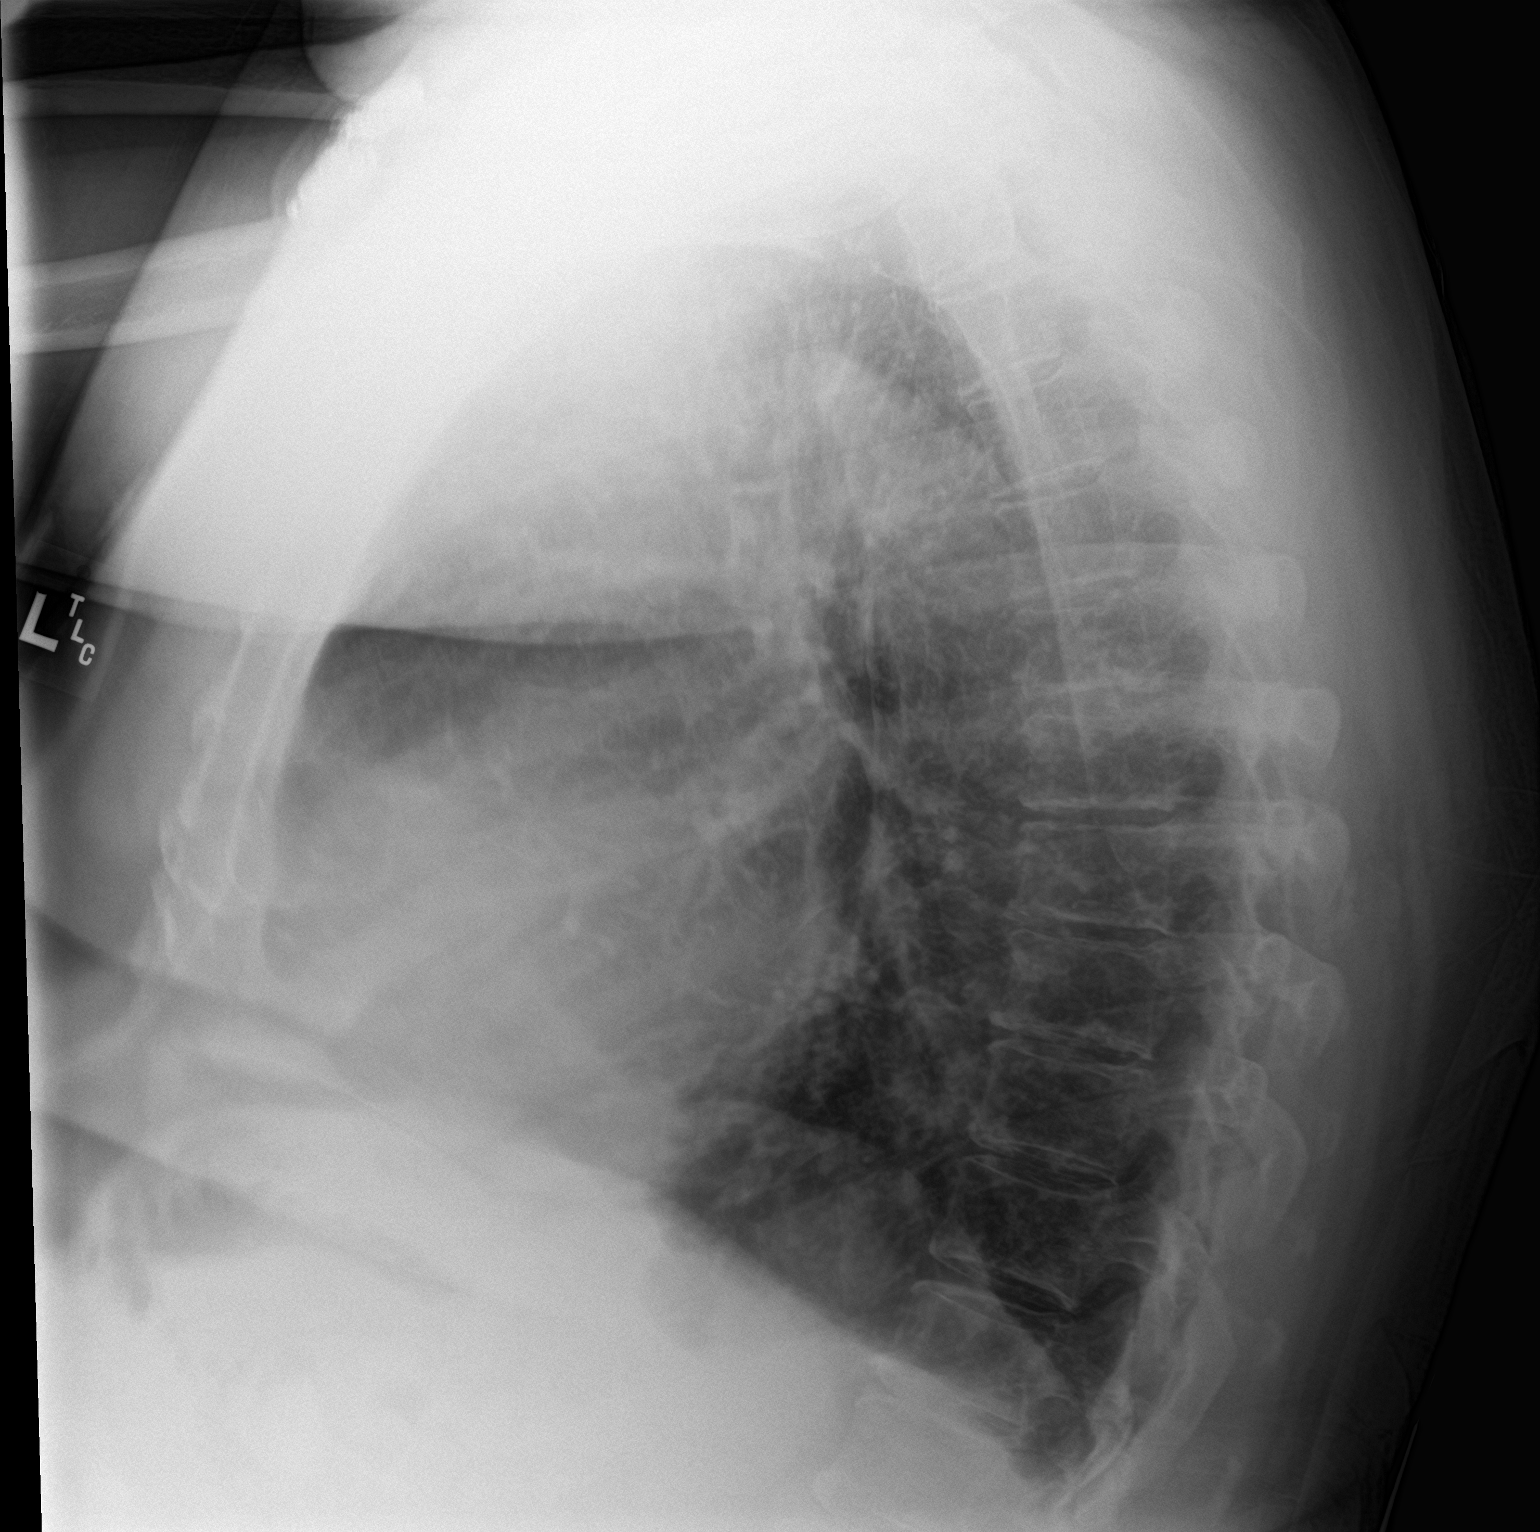

[chest ap]
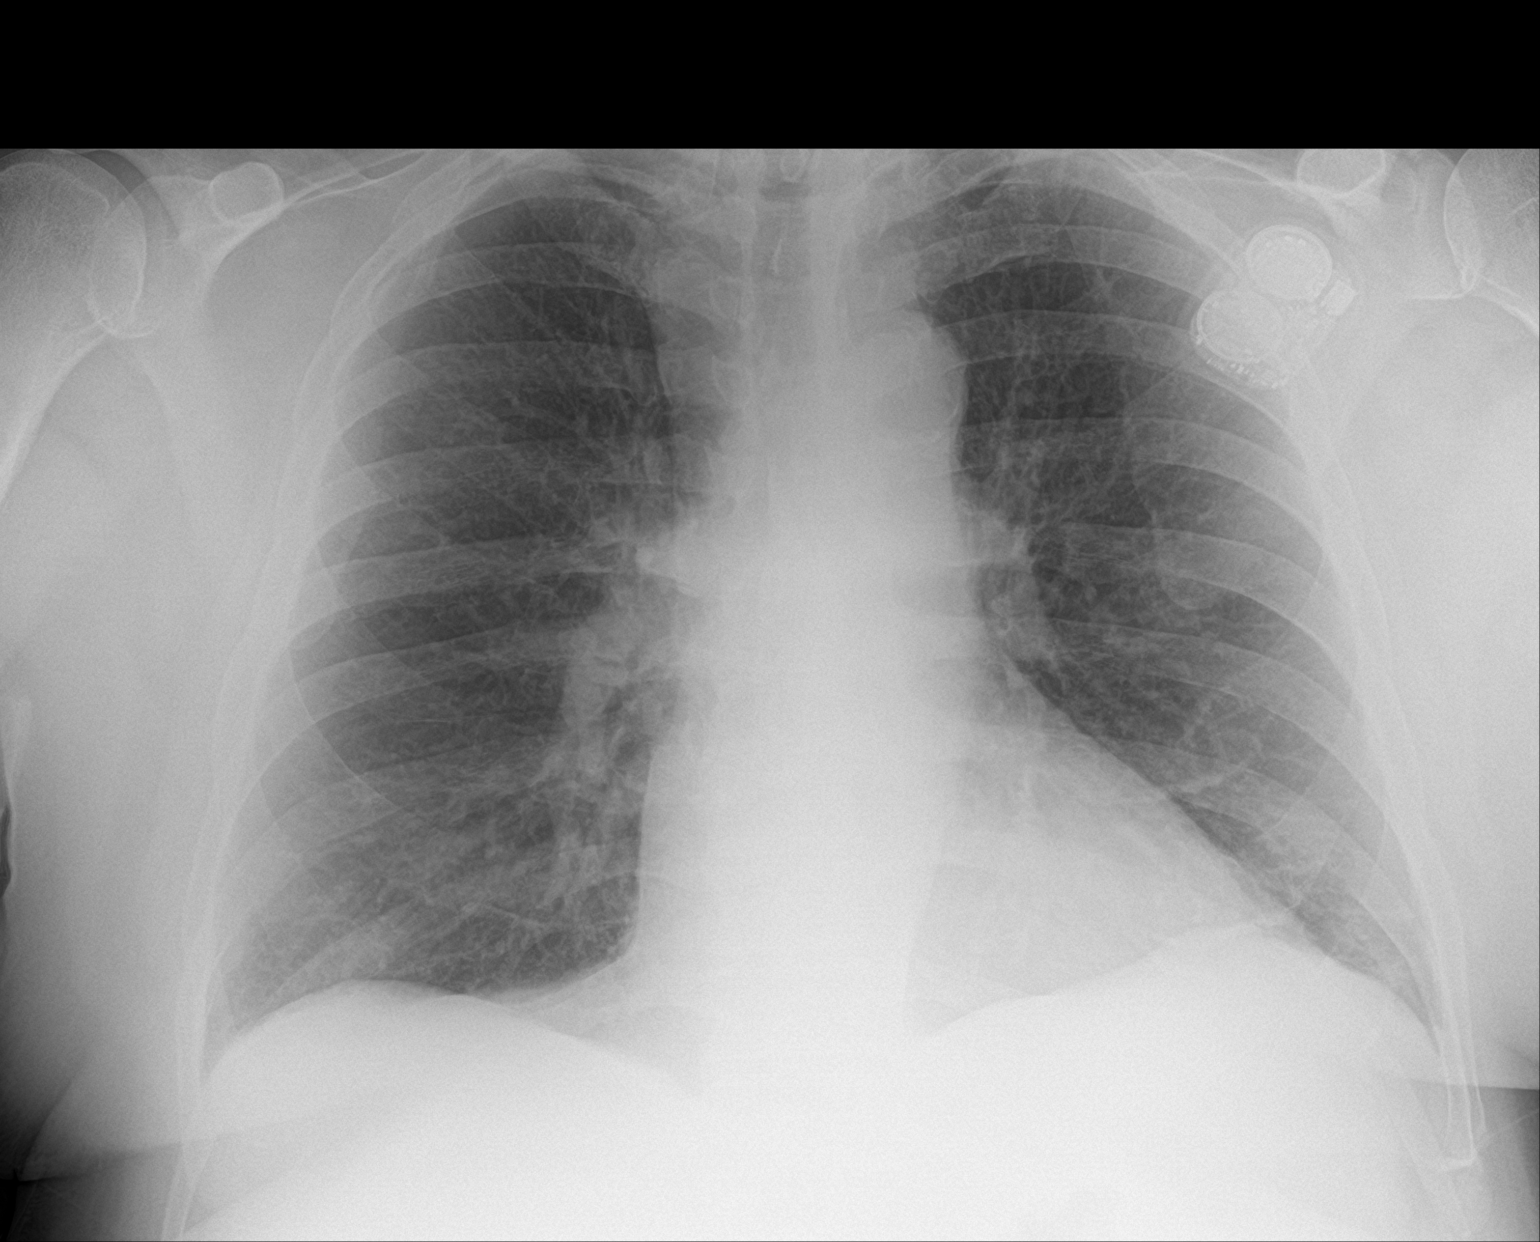

[2 of 2 positions shown; findings below may reference images not displayed]

FINDINGS: The heart size and mediastinal contours are within normal limits.
Both lungs are clear. The visualized skeletal structures are
unremarkable.
IMPRESSION: No active cardiopulmonary disease.

## 2020-05-04 MED ORDER — SODIUM CHLORIDE 0.9% FLUSH
3.0000 mL | Freq: Two times a day (BID) | INTRAVENOUS | Status: DC
  Administered 2020-05-04 – 2020-05-06 (×4): 3 mL via INTRAVENOUS

## 2020-05-04 MED ORDER — ASPIRIN EC 81 MG PO TBEC
81.0000 mg | DELAYED_RELEASE_TABLET | Freq: Every day | ORAL | Status: DC
  Administered 2020-05-05 – 2020-05-07 (×3): 81 mg via ORAL
  Filled 2020-05-04 (×3): qty 1

## 2020-05-04 MED ORDER — HYDRALAZINE HCL 20 MG/ML IJ SOLN
10.0000 mg | Freq: Once | INTRAMUSCULAR | Status: AC
  Administered 2020-05-04: 10 mg via INTRAVENOUS
  Filled 2020-05-04: qty 1

## 2020-05-04 MED ORDER — ASPIRIN 81 MG PO CHEW
324.0000 mg | CHEWABLE_TABLET | Freq: Once | ORAL | Status: AC
  Administered 2020-05-04: 324 mg via ORAL
  Filled 2020-05-04: qty 4

## 2020-05-04 MED ORDER — POTASSIUM CHLORIDE CRYS ER 20 MEQ PO TBCR
40.0000 meq | EXTENDED_RELEASE_TABLET | Freq: Once | ORAL | Status: AC
  Administered 2020-05-04: 40 meq via ORAL
  Filled 2020-05-04: qty 2

## 2020-05-04 MED ORDER — ENOXAPARIN SODIUM 40 MG/0.4ML ~~LOC~~ SOLN
40.0000 mg | SUBCUTANEOUS | Status: DC
  Administered 2020-05-04 – 2020-05-06 (×3): 40 mg via SUBCUTANEOUS
  Filled 2020-05-04 (×3): qty 0.4

## 2020-05-04 MED ORDER — CLOPIDOGREL BISULFATE 75 MG PO TABS
75.0000 mg | ORAL_TABLET | Freq: Every day | ORAL | Status: DC
  Administered 2020-05-05 – 2020-05-07 (×3): 75 mg via ORAL
  Filled 2020-05-04 (×3): qty 1

## 2020-05-04 MED ORDER — ROSUVASTATIN CALCIUM 5 MG PO TABS
10.0000 mg | ORAL_TABLET | Freq: Every day | ORAL | Status: DC
  Administered 2020-05-04 – 2020-05-06 (×3): 10 mg via ORAL
  Filled 2020-05-04 (×3): qty 2

## 2020-05-04 MED ORDER — PANTOPRAZOLE SODIUM 40 MG PO TBEC
40.0000 mg | DELAYED_RELEASE_TABLET | Freq: Every day | ORAL | Status: DC
  Administered 2020-05-05 – 2020-05-07 (×3): 40 mg via ORAL
  Filled 2020-05-04 (×3): qty 1

## 2020-05-04 MED ORDER — LABETALOL HCL 5 MG/ML IV SOLN
5.0000 mg | INTRAVENOUS | Status: DC | PRN
  Administered 2020-05-05 (×2): 5 mg via INTRAVENOUS
  Filled 2020-05-04 (×2): qty 4

## 2020-05-04 MED ORDER — POLYETHYLENE GLYCOL 3350 17 G PO PACK: 17.0000 g | PACK | Freq: Every day | ORAL | Status: DC | PRN

## 2020-05-04 NOTE — H&P (Addendum)
Date: 05/04/2020               Patient Name:  Dean Wilson MRN: [id number]  DOB: 1960-05-07 Age / Sex: 60 y.o., male   PCP: Glenda Chroman, MD         Medical Service: Internal Medicine Teaching Service         Attending Physician: Dr. Aldine Contes, MD    First Contact: Jeralyn Bennett, MD Pager: [place] [phone]  Second Contact: Marianna Payment, DO Pager: [facility] [phone]       After Hours (After 5p/  First Contact Pager: [phone]  weekends / holidays): Second Contact Pager: [phone]   SUBJECTIVE   Chief Complaint: shortness of breath  History of Present Illness: Dean Wilson is a 60 y.o. male with a pertinent PMH of CVA (04/27/20), HTN, HLD?, SVT?, GERD, who presents to [facility] with acute onset shortness of breath.  Patient states that he was recently discharged from the hospital in [place] for CVA. He states he been feeling fine and even went to work Monday through Wednesday.  He has noticed that his blood pressures have been running high in the 758 systolic despite antihypertensive medications.  Otherwise he has been feeling well, he states that he went to work Monday through Wednesday.  When getting to work this morning, he noticed that he was developing shortness of breath with exertion, which is new for him.  He denied any associated chest pain, nausea, emesis, weakness, palpitations.  He denies any shortness of breath at rest or orthopnea, but has noticed lower extremity edema.  Patient states that he does have a history of presumed SVT, but never truly diagnosed with SVT and is currently wearing a Holter monitor status post CVA.  His residual deficits from his stroke include some mild dysarthria but otherwise no weakness or numbness.  He admits to taking all of his medications as prescribed including his aspirin and Plavix.  In the ED, patient was found to have elevated blood pressures in the 832P systolic that have since dropped to the low 100s with hydralazine.   Medications:   Current Meds  Medication Sig  . ASPIRIN LOW DOSE 81 MG EC tablet Take 81 mg by mouth daily.  . carvedilol (COREG) 25 MG tablet Take 25 mg by mouth 2 (two) times daily.  . clopidogrel (PLAVIX) 75 MG tablet Take 75 mg by mouth daily.  . DENTA 5000 PLUS 1.1 % CREA dental cream Take 1 application by mouth at bedtime.  . diclofenac (VOLTAREN) 75 MG EC tablet Take 75 mg by mouth at bedtime.  Marland Kitchen diltiazem (TIAZAC) 360 MG 24 hr capsule Take 360 mg by mouth daily.  . hydrALAZINE (APRESOLINE) 25 MG tablet Take 25 mg by mouth 3 (three) times daily.  Marland Kitchen lisinopril (PRINIVIL,ZESTRIL) 20 MG tablet Take 20 mg by mouth in the morning and at bedtime.  . Multiple Vitamin (MULTIVITAMIN) tablet Take 1 tablet by mouth daily.  . pantoprazole (PROTONIX) 40 MG tablet Take 40 mg by mouth daily.  . potassium chloride SA (KLOR-CON) 20 MEQ tablet Take 20 mEq by mouth daily.  . rosuvastatin (CRESTOR) 10 MG tablet Take 10 mg by mouth daily.    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 Louis Matte Substance use - no illicit drug, chew nicorette (smoked for 40 years and quit 12 years ago) and wife smokes in the  house, No ETOH   Family History: Mom - COPD  Dad - MI, liver cancer, HTN, HLD,  Children - no medical problems  Allergies: Allergies as of 05/04/2020 - Review Complete 05/04/2020  Allergen Reaction Noted  . Banana Itching 02/06/2016  . Sunflower oil Itching 02/06/2016  . Tomato Itching 02/06/2016   Past Medical History:  Diagnosis Date  . Essential hypertension   . GERD (gastroesophageal reflux disease)   . PSVT (paroxysmal supraventricular tachycardia) ([facility])   . Stroke [facility])     Review of Systems: A complete ROS was negative except as per HPI.   OBJECTIVE:   Physical Exam: Blood pressure 108/86, pulse (!) 57, temperature 98 F (36.7 C), resp. rate 15, SpO2 99 %. Physical Exam Constitutional:       Appearance: He is obese. He is diaphoretic.  HENT:     Head: Normocephalic and atraumatic.  Eyes:     Extraocular Movements: Extraocular movements intact.     Pupils: Pupils are equal, round, and reactive to light.  Neck:     Vascular: JVD present.  Cardiovascular:     Rate and Rhythm: Regular rhythm. Bradycardia present.     Pulses: Normal pulses.     Comments: Distant heart sounds Pulmonary:     Effort: No respiratory distress.     Breath sounds: No wheezing or rales.  Abdominal:     General: There is no distension.     Tenderness: There is no abdominal tenderness.  Musculoskeletal:        General: Swelling (2-3+ pitting edema to his knees) present. Normal range of motion.     Cervical back: Normal range of motion.  Skin:    General: Skin is warm.  Neurological:     General: No focal deficit present.     Mental Status: He is alert and oriented to person, place, and time.     Cranial Nerves: Dysarthria present.     Labs: CBC    Component Value Date/Time   WBC 7.7 05/04/2020 1210   RBC 5.63 05/04/2020 1210   HGB 16.2 05/04/2020 1210   HCT 48.3 05/04/2020 1210   PLT 238 05/04/2020 1210   MCV 85.8 05/04/2020 1210   MCH 28.8 05/04/2020 1210   MCHC 33.5 05/04/2020 1210   RDW 14.9 05/04/2020 1210   LYMPHSABS 2.3 05/04/2020 1210   MONOABS 0.9 05/04/2020 1210   EOSABS 0.4 05/04/2020 1210   BASOSABS 0.1 05/04/2020 1210     CMP     Component Value Date/Time   NA 141 05/04/2020 1210   K 3.1 (L) 05/04/2020 1210   CL 101 05/04/2020 1210   CO2 31 05/04/2020 1210   GLUCOSE 117 (H) 05/04/2020 1210   BUN 24 (H) 05/04/2020 1210   CREATININE 1.37 (H) 05/04/2020 1210   CALCIUM 9.3 05/04/2020 1210   PROT 6.7 05/04/2020 1210   ALBUMIN 3.8 05/04/2020 1210   AST 22 05/04/2020 1210   ALT 21 05/04/2020 1210   ALKPHOS 55 05/04/2020 1210   BILITOT 0.8 05/04/2020 1210   GFRNONAA 59 (L) 05/04/2020 1210   GFRAA 59 (L) 02/06/2016 0835    Imaging: DG Chest 2 View  Result  Date: 05/04/2020 CLINICAL DATA:  Shortness of breath EXAM: CHEST - 2 VIEW COMPARISON:  12/15/2019 FINDINGS: The heart size and mediastinal contours are within normal limits. Both lungs are clear. The visualized skeletal structures are unremarkable. IMPRESSION: No active cardiopulmonary disease. Electronically Signed   By: Kathreen Devoid   On: 05/04/2020 12:25  EKG: personally reviewed my interpretation is normal sinus rhythm with nonspecific T wave changes  ASSESSMENT & PLAN:   Active Problems:   Heart failure (HCC)   TUCK DULWORTH is a 60 y.o. with pertinent PMH of CVA (04/27/20), HTN, HLD?, SVT?, GERD, who presented with acute onset shortness of breath and admit for severe hypertension and concern for new onset heart failure on hospital day 0  #Severe Symptomatic HTN #Acute onset heart failure #Troponemia, demand Patient presents with acute onset shortness of breath with severe symptomatic hypotension.  This concerned the patient's blood pressure may have precipitated acute onset heart failure.  Patient presented with systolic blood pressures greater than 200 and currently his pressures are down in the low 267T systolic after being started on hydralazine in the ED.  He does have signs and symptoms concerning for heart failure including lower extremity edema, elevated JVD, and exercise intolerance secondary to shortness of breath.  In the ED he was found to have a elevated BNP of 329.  His D-dimer was negative.  His troponin was mildly elevated at 4 and repeat was flat at 21, likely secondary to demand.   -Goal blood pressure 160/90. -As needed labetalol for pressures greater than 180/100 -Echocardiogram pending -We will check electrolytes and replete as needed -We will give furosemide 20 mg IV -Strict ins and outs, daily weights -Patient last took carvedilol today.  Therefore I will restart his carvedilol tomorrow at 3.125 mg twice daily.  Consider increasing dose as blood pressure  goals allow -Hold diltiazem, hydralazine, and lisinopril.  Will consider restarting these as blood pressure will allow  #Acute on chronic kidney disease: Patient presents with an elevated creatinine of 1.37.  Unknown baseline.  Considering his severe symptomatic hypertension, we will lower his blood pressure slowly.  Therefore we will hold off on antihypertensive medications including ACE/ARB's.  We will trend his kidney function to see if it improves.   -Strict ins and outs monitor urine output daily -Replete electrolytes as needed -Avoid nephrotoxic medications -Hold ACE/ARB for now consider restarting the near future once kidney function stabilizes.  #CVA: #HLD: Presents after recent CVA.  We will keep patient on telemetry and restart aspirin and Plavix. - ASA and Plavix  - Continue Tele - continue rosuvastatin 10 mg daily  #Hx of SVT  #Bradycardia Has a history of SVT.  Therefore we will keep heon telemetry while he is here.  Patient does have signs of sinus bradycardia.  He is on diltiazem 360 mg every 24 hours.  This could be contributing to his bradycardia.  Therefore we will hold this medication particularly in setting of acute heart failure. -Hold diltiazem 360 mg -Check electrolytes and replete as needed -Continue telemetry  Diet: Heart Healthy VTE: Enoxaparin IVF: None,None Code: Full  Prior to Admission Living Arrangement: Home, living wife Anticipated Discharge Location: Home Barriers to Discharge: work up  Dispo: Admit patient to Observation with expected length of stay less than 2 midnights.  Signed: Lawerance Cruel, D.O.  Internal Medicine Resident, PGY-2 [facility]  Pager: [phone] 6:49 PM, 05/04/2020   Please contact the on call pager after 5 pm and on weekends at [phone].

## 2020-05-04 NOTE — ED Notes (Signed)
Have not received pt to room 29, charge nurse notified, was told pt in xray and then to come to 95

## 2020-05-04 NOTE — ED Notes (Signed)
ED Provider at bedside, dr Reather Converse

## 2020-05-04 NOTE — ED Triage Notes (Signed)
Patient complains of exertional SOB sinced early am. Patient just DC from hospital post stroke this past Friday. Patient denies pain. Alert and oriented

## 2020-05-04 NOTE — ED Notes (Signed)
Attempted calling report numerous times but phone keeps getting placed on hold. Agricultural consultant notified.

## 2020-05-04 NOTE — ED Notes (Signed)
Received pt to ED 29, no distress

## 2020-05-04 NOTE — ED Provider Notes (Signed)
[place] EMERGENCY DEPARTMENT Provider Note   CSN: [id number] Arrival date & time: 05/04/20  1127     History No chief complaint on file.   Dean Wilson is a 60 y.o. male.  Patient with history of SVT, reflux, high blood pressure, obesity, recent admission to [facility] for acute stroke presents with exertional shortness of breath.  No history of similar.  No history of lung disease, heart failure or blood clots.  Minimal swelling in both ankles bilateral.  Mild exertional component noted since this morning.  No fevers chills or significant cough.        Past Medical History:  Diagnosis Date  . Essential hypertension   . GERD (gastroesophageal reflux disease)   . PSVT (paroxysmal supraventricular tachycardia) ([place])   . Stroke [facility])     Patient Active Problem List   Diagnosis Date Noted  . Gastritis 01/26/2011    Past Surgical History:  Procedure Laterality Date  . UMBILICAL HERNIA REPAIR         Family History  Problem Relation Age of Onset  . Anesthesia problems Neg Hx   . Hypotension Neg Hx   . Malignant hyperthermia Neg Hx   . Pseudochol deficiency Neg Hx     Social History   Tobacco Use  . Smoking status: Former Smoker    Packs/day: 2.00    Years: 25.00    Pack years: 50.00    Types: Cigarettes    Quit date: 01/26/2008    Years since quitting: 12.2  . Smokeless tobacco: Never Used  Substance Use Topics  . Alcohol use: Yes    Comment: Rare  . Drug use: No    Home Medications Prior to Admission medications   Medication Sig Start Date End Date Taking? Authorizing Provider  ASPIRIN LOW DOSE 81 MG EC tablet Take 81 mg by mouth daily. 04/29/20  Yes [provider]  carvedilol (COREG) 25 MG tablet Take 25 mg by mouth 2 (two) times daily. 04/20/20  Yes [provider]  clopidogrel (PLAVIX) 75 MG tablet Take 75 mg by mouth daily. 04/29/20  Yes [provider]  DENTA 5000 PLUS 1.1 % CREA dental  cream Take 1 application by mouth at bedtime. 04/21/20  Yes [provider]  diclofenac (VOLTAREN) 75 MG EC tablet Take 75 mg by mouth at bedtime. 03/11/20  Yes [provider]  diltiazem (TIAZAC) 360 MG 24 hr capsule Take 360 mg by mouth daily.   Yes [provider]  hydrALAZINE (APRESOLINE) 25 MG tablet Take 25 mg by mouth 3 (three) times daily.   Yes [provider]  lisinopril (PRINIVIL,ZESTRIL) 20 MG tablet Take 20 mg by mouth in the morning and at bedtime.   Yes [provider]  Multiple Vitamin (MULTIVITAMIN) tablet Take 1 tablet by mouth daily.   Yes [provider]  pantoprazole (PROTONIX) 40 MG tablet Take 40 mg by mouth daily.   Yes [provider]  potassium chloride SA (KLOR-CON) 20 MEQ tablet Take 20 mEq by mouth daily.   Yes [provider]  rosuvastatin (CRESTOR) 10 MG tablet Take 10 mg by mouth daily. 04/06/20  Yes [provider]  pantoprazole (PROTONIX) 40 MG tablet Take 1 tablet (40 mg total) by mouth daily. 01/26/11 01/26/12  Rogene Houston, MD    Allergies    Banana, Sunflower oil, and Tomato  Review of Systems   Review of Systems  Constitutional: Negative for chills and fever.  HENT: Negative  for congestion.   Eyes: Negative for visual disturbance.  Respiratory: Positive for shortness of breath.   Cardiovascular: Negative for chest pain.  Gastrointestinal: Negative for abdominal pain and vomiting.  Genitourinary: Negative for dysuria and flank pain.  Musculoskeletal: Negative for back pain, neck pain and neck stiffness.  Skin: Negative for rash.  Neurological: Negative for light-headedness and headaches.    Physical Exam Updated Vital Signs BP (!) 150/76   Pulse (!) 52   Temp 98 F (36.7 C)   Resp 16   SpO2 98%   Physical Exam Vitals and nursing note reviewed.  Constitutional:      Appearance: He is well-developed.  HENT:     Head: Normocephalic and atraumatic.  Eyes:      General:        Right eye: No discharge.        Left eye: No discharge.     Conjunctiva/sclera: Conjunctivae normal.  Neck:     Trachea: No tracheal deviation.  Cardiovascular:     Rate and Rhythm: Normal rate and regular rhythm.     Heart sounds: No murmur heard.   Pulmonary:     Effort: Pulmonary effort is normal.     Breath sounds: Normal breath sounds.  Abdominal:     General: There is no distension.     Palpations: Abdomen is soft.     Tenderness: There is no abdominal tenderness. There is no guarding.  Musculoskeletal:        General: Swelling (minimal ankles bilateral) present.     Cervical back: Normal range of motion and neck supple.  Skin:    General: Skin is warm.     Findings: No rash.  Neurological:     General: No focal deficit present.     Mental Status: He is alert and oriented to person, place, and time.  Psychiatric:        Mood and Affect: Mood normal.     ED Results / Procedures / Treatments   Labs (all labs ordered are listed, but only abnormal results are displayed) Labs Reviewed  BASIC METABOLIC PANEL - Abnormal; Notable for the following components:      Result Value   Potassium 3.1 (*)    Glucose, Bld 117 (*)    BUN 24 (*)    Creatinine, Ser 1.37 (*)    GFR, Estimated 59 (*)    All other components within normal limits  BRAIN NATRIURETIC PEPTIDE - Abnormal; Notable for the following components:   B Natriuretic Peptide 329.1 (*)    All other components within normal limits  TROPONIN I (HIGH SENSITIVITY) - Abnormal; Notable for the following components:   Troponin I (High Sensitivity) 24 (*)    All other components within normal limits  RESP PANEL BY RT-PCR (FLU A&B, COVID) ARPGX2  CBC WITH DIFFERENTIAL/PLATELET  D-DIMER, QUANTITATIVE  HEPATIC FUNCTION PANEL  TROPONIN I (HIGH SENSITIVITY)    EKG EKG Interpretation  Date/Time:  Wednesday May 04 2020 11:39:37 EDT Ventricular Rate:  66 PR Interval:  180 QRS Duration: 98 QT  Interval:  428 QTC Calculation: 448 R Axis:   85 Text Interpretation: Normal sinus rhythm Nonspecific T wave abnormality Abnormal ECG Confirmed by Elnora Morrison [phone]) on 05/04/2020 1:05:44 PM   Radiology DG Chest 2 View  Result Date: 05/04/2020 CLINICAL DATA:  Shortness of breath EXAM: CHEST - 2 VIEW COMPARISON:  12/15/2019 FINDINGS: The heart size and mediastinal contours are within normal limits. Both lungs are clear. The visualized skeletal  structures are unremarkable. IMPRESSION: No active cardiopulmonary disease. Electronically Signed   By: Kathreen Devoid   On: 05/04/2020 12:25    Procedures Procedures   Medications Ordered in ED Medications  aspirin chewable tablet 324 mg (324 mg Oral Given 05/04/20 1432)    ED Course  I have reviewed the triage vital signs and the nursing notes.  Pertinent labs & imaging results that were available during my care of the patient were reviewed by me and considered in my medical decision making (see chart for details).    MDM Rules/Calculators/A&P                           Patient presents with exertional shortness of breath since this morning.  Lungs are clear on exam, no fever, no respiratory distress.  Differential diagnosis includes cardiac/heart failure, Covid, anemia, blood clots with recent admission although patient is overall low risk, undiagnosed lung disease, other.  Plan for general blood work, chest x-ray and D-dimer.  EKG reviewed no acute findings.  Patient having no chest pain. Reviewed medical records and patient had 45% ejection fraction, recent stroke work-up. On reassessment patient well-appearing as long as he is at rest and not exerting himself.  D-dimer negative, 3 potassium 3.1 will oral supplement, creatinine 1.3.  BNP 300s, troponin XX 4.  Cardiology consulted for concern for CHF/ACS.  Patient care signed out to follow-up and reassess.  Final Clinical Impression(s) / ED Diagnoses Final diagnoses:  Acute dyspnea   Troponin elevated  Rx / DC Orders ED Discharge Orders    None       Elnora Morrison, MD 05/04/20 [phone]

## 2020-05-04 NOTE — ED Provider Notes (Signed)
With a past medical history significant for recent stroke, hypertension, GERD, and PSVT who presents with concerns for worsening exertional shortness of breath, bilateral leg edema, and fatigue.  Patient was seen by previous team and started work-up that was concerning for possible new heart failure with edema and elevated BNP.  Troponin also slightly elevated but remained stable on reassessment.  Patient signed out while waiting for cardiology recommendations.  Cardiology called and felt that this was likely more hypertensive emergency and until patient has an echo completed, they felt that he was more appropriate as a medicine admission and to call them if the echo shows concern for abnormality or heart failure.  Will give the patient some IV blood pressure medicine as his blood pressure still remains around 190 on my reassessment.  He also has a heart rate in the 50s so will avoid beta-blockers initially.  We will give hydralazine and call medicine for admission.  Clinical Impression: 1. Acute dyspnea     Disposition: Admit  This note was prepared with assistance of Dragon voice recognition software. Occasional wrong-word or sound-a-like substitutions may have occurred due to the inherent limitations of voice recognition software.       Jakaylah Schlafer, Gwenyth Allegra, MD 05/05/20 [phone]

## 2020-05-04 NOTE — ED Notes (Signed)
ekg note below was completed at 1139

## 2020-05-05 ENCOUNTER — Observation Stay (HOSPITAL_COMMUNITY): Payer: BC Managed Care – PPO

## 2020-05-05 DIAGNOSIS — Z800 Family history of malignant neoplasm of digestive organs: Secondary | ICD-10-CM | POA: Diagnosis not present

## 2020-05-05 DIAGNOSIS — Z20822 Contact with and (suspected) exposure to covid-19: Secondary | ICD-10-CM | POA: Diagnosis present

## 2020-05-05 DIAGNOSIS — E875 Hyperkalemia: Secondary | ICD-10-CM | POA: Diagnosis present

## 2020-05-05 DIAGNOSIS — N182 Chronic kidney disease, stage 2 (mild): Secondary | ICD-10-CM | POA: Diagnosis present

## 2020-05-05 DIAGNOSIS — E785 Hyperlipidemia, unspecified: Secondary | ICD-10-CM | POA: Diagnosis present

## 2020-05-05 DIAGNOSIS — R0602 Shortness of breath: Secondary | ICD-10-CM

## 2020-05-05 DIAGNOSIS — K219 Gastro-esophageal reflux disease without esophagitis: Secondary | ICD-10-CM | POA: Diagnosis present

## 2020-05-05 DIAGNOSIS — Z7982 Long term (current) use of aspirin: Secondary | ICD-10-CM | POA: Diagnosis not present

## 2020-05-05 DIAGNOSIS — I340 Nonrheumatic mitral (valve) insufficiency: Secondary | ICD-10-CM | POA: Diagnosis present

## 2020-05-05 DIAGNOSIS — Z825 Family history of asthma and other chronic lower respiratory diseases: Secondary | ICD-10-CM | POA: Diagnosis not present

## 2020-05-05 DIAGNOSIS — E669 Obesity, unspecified: Secondary | ICD-10-CM | POA: Diagnosis present

## 2020-05-05 DIAGNOSIS — I69322 Dysarthria following cerebral infarction: Secondary | ICD-10-CM | POA: Diagnosis not present

## 2020-05-05 DIAGNOSIS — Z79899 Other long term (current) drug therapy: Secondary | ICD-10-CM | POA: Diagnosis not present

## 2020-05-05 DIAGNOSIS — Z8249 Family history of ischemic heart disease and other diseases of the circulatory system: Secondary | ICD-10-CM | POA: Diagnosis not present

## 2020-05-05 DIAGNOSIS — G4736 Sleep related hypoventilation in conditions classified elsewhere: Secondary | ICD-10-CM | POA: Diagnosis not present

## 2020-05-05 DIAGNOSIS — I130 Hypertensive heart and chronic kidney disease with heart failure and stage 1 through stage 4 chronic kidney disease, or unspecified chronic kidney disease: Secondary | ICD-10-CM | POA: Diagnosis present

## 2020-05-05 DIAGNOSIS — R0600 Dyspnea, unspecified: Secondary | ICD-10-CM

## 2020-05-05 DIAGNOSIS — I160 Hypertensive urgency: Secondary | ICD-10-CM | POA: Diagnosis not present

## 2020-05-05 DIAGNOSIS — I5021 Acute systolic (congestive) heart failure: Secondary | ICD-10-CM | POA: Diagnosis present

## 2020-05-05 DIAGNOSIS — I161 Hypertensive emergency: Secondary | ICD-10-CM

## 2020-05-05 DIAGNOSIS — R0902 Hypoxemia: Secondary | ICD-10-CM | POA: Diagnosis present

## 2020-05-05 DIAGNOSIS — I509 Heart failure, unspecified: Secondary | ICD-10-CM

## 2020-05-05 DIAGNOSIS — Z6835 Body mass index (BMI) 35.0-35.9, adult: Secondary | ICD-10-CM | POA: Diagnosis not present

## 2020-05-05 DIAGNOSIS — E876 Hypokalemia: Secondary | ICD-10-CM | POA: Diagnosis present

## 2020-05-05 DIAGNOSIS — Z7902 Long term (current) use of antithrombotics/antiplatelets: Secondary | ICD-10-CM | POA: Diagnosis not present

## 2020-05-05 DIAGNOSIS — G4733 Obstructive sleep apnea (adult) (pediatric): Secondary | ICD-10-CM | POA: Diagnosis present

## 2020-05-05 DIAGNOSIS — I252 Old myocardial infarction: Secondary | ICD-10-CM | POA: Diagnosis not present

## 2020-05-05 DIAGNOSIS — Z91018 Allergy to other foods: Secondary | ICD-10-CM | POA: Diagnosis not present

## 2020-05-05 LAB — COMPREHENSIVE METABOLIC PANEL
ALT: 21 U/L (ref 0–44)
AST: 18 U/L (ref 15–41)
Albumin: 3.5 g/dL (ref 3.5–5.0)
Alkaline Phosphatase: 50 U/L (ref 38–126)
Anion gap: 7 (ref 5–15)
BUN: 20 mg/dL (ref 8–23)
CO2: 30 mmol/L (ref 22–32)
Calcium: 8.9 mg/dL (ref 8.9–10.3)
Chloride: 102 mmol/L (ref 98–111)
Creatinine, Ser: 1.26 mg/dL — ABNORMAL HIGH (ref 0.61–1.24)
GFR, Estimated: >60 mL/min (ref >60)
Glucose, Bld: 99 mg/dL (ref 70–99)
Potassium: 2.7 mmol/L — CL (ref 3.5–5.1)
Sodium: 139 mmol/L (ref 135–145)
Total Bilirubin: 1 mg/dL (ref 0.3–1.2)
Total Protein: 6.2 g/dL — ABNORMAL LOW (ref 6.5–8.1)

## 2020-05-05 LAB — CBC
HCT: 44.9 % (ref 39.0–52.0)
Hemoglobin: 14.9 g/dL (ref 13.0–17.0)
MCH: 28.2 pg (ref 26.0–34.0)
MCHC: 33.2 g/dL (ref 30.0–36.0)
MCV: 85 fL (ref 80.0–100.0)
Platelets: 224 10*3/uL (ref 150–400)
RBC: 5.28 MIL/uL (ref 4.22–5.81)
RDW: 15.1 % (ref 11.5–15.5)
WBC: 7.3 10*3/uL (ref 4.0–10.5)
nRBC: 0 % (ref 0.0–0.2)

## 2020-05-05 LAB — RENAL FUNCTION PANEL
Albumin: 3.5 g/dL (ref 3.5–5.0)
Anion gap: 7 (ref 5–15)
BUN: 20 mg/dL (ref 8–23)
CO2: 30 mmol/L (ref 22–32)
Calcium: 9 mg/dL (ref 8.9–10.3)
Chloride: 101 mmol/L (ref 98–111)
Creatinine, Ser: 1.35 mg/dL — ABNORMAL HIGH (ref 0.61–1.24)
GFR, Estimated: 60 mL/min — ABNORMAL LOW (ref >60)
Glucose, Bld: 113 mg/dL — ABNORMAL HIGH (ref 70–99)
Phosphorus: 2.6 mg/dL (ref 2.5–4.6)
Potassium: 3.3 mmol/L — ABNORMAL LOW (ref 3.5–5.1)
Sodium: 138 mmol/L (ref 135–145)

## 2020-05-05 LAB — ECHOCARDIOGRAM COMPLETE
AR max vel: 5.81 cm2
AV Area VTI: 5.76 cm2
AV Area mean vel: 5.21 cm2
AV Mean grad: 5 mmHg
AV Peak grad: 8 mmHg
Ao pk vel: 1.41 m/s
Area-P 1/2: 2.55 cm2
Calc EF: 33.1 %
Height: 73 in
MV M vel: 5.49 m/s
MV Peak grad: 120.6 mmHg
MV VTI: 4.47 cm2
Radius: 0.3 cm
S' Lateral: 4 cm
Single Plane A2C EF: 19.8 %
Single Plane A4C EF: 41.8 %
Weight: 4455.06 oz

## 2020-05-05 LAB — MAGNESIUM: Magnesium: 2.1 mg/dL (ref 1.7–2.4)

## 2020-05-05 LAB — TSH: TSH: 4.69 u[IU]/mL — ABNORMAL HIGH (ref 0.350–4.500)

## 2020-05-05 LAB — PHOSPHORUS: Phosphorus: 3.6 mg/dL (ref 2.5–4.6)

## 2020-05-05 MED ORDER — POTASSIUM CHLORIDE CRYS ER 20 MEQ PO TBCR
40.0000 meq | EXTENDED_RELEASE_TABLET | Freq: Two times a day (BID) | ORAL | Status: AC
  Administered 2020-05-05 (×2): 40 meq via ORAL
  Filled 2020-05-05 (×2): qty 2

## 2020-05-05 MED ORDER — DILTIAZEM HCL ER BEADS 240 MG PO CP24: 360.0000 mg | ORAL_CAPSULE | Freq: Every day | ORAL | Status: DC

## 2020-05-05 MED ORDER — LISINOPRIL 20 MG PO TABS
20.0000 mg | ORAL_TABLET | Freq: Every day | ORAL | Status: DC
  Administered 2020-05-05: 20 mg via ORAL
  Filled 2020-05-05: qty 1

## 2020-05-05 MED ORDER — POTASSIUM CHLORIDE 10 MEQ/100ML IV SOLN
10.0000 meq | INTRAVENOUS | Status: AC
  Administered 2020-05-05 (×4): 10 meq via INTRAVENOUS
  Filled 2020-05-05 (×4): qty 100

## 2020-05-05 MED ORDER — CARVEDILOL 25 MG PO TABS
25.0000 mg | ORAL_TABLET | Freq: Two times a day (BID) | ORAL | Status: DC
  Administered 2020-05-05 – 2020-05-07 (×4): 25 mg via ORAL
  Filled 2020-05-05 (×4): qty 1

## 2020-05-05 MED ORDER — LISINOPRIL 20 MG PO TABS
20.0000 mg | ORAL_TABLET | Freq: Every day | ORAL | Status: DC
  Administered 2020-05-05 – 2020-05-06 (×2): 20 mg via ORAL
  Filled 2020-05-05 (×2): qty 1

## 2020-05-05 MED ORDER — POTASSIUM CHLORIDE CRYS ER 20 MEQ PO TBCR: 40.0000 meq | EXTENDED_RELEASE_TABLET | Freq: Two times a day (BID) | ORAL | Status: DC

## 2020-05-05 MED ORDER — PERFLUTREN LIPID MICROSPHERE
1.0000 mL | INTRAVENOUS | Status: AC | PRN
  Administered 2020-05-05: 2 mL via INTRAVENOUS
  Filled 2020-05-05: qty 10

## 2020-05-05 MED ORDER — CARVEDILOL 3.125 MG PO TABS
3.1250 mg | ORAL_TABLET | Freq: Two times a day (BID) | ORAL | Status: DC
  Filled 2020-05-05: qty 1

## 2020-05-05 MED ORDER — FUROSEMIDE 10 MG/ML IJ SOLN
20.0000 mg | Freq: Once | INTRAMUSCULAR | Status: AC
  Administered 2020-05-05: 20 mg via INTRAVENOUS
  Filled 2020-05-05: qty 2

## 2020-05-05 MED ORDER — HYDRALAZINE HCL 25 MG PO TABS
25.0000 mg | ORAL_TABLET | Freq: Three times a day (TID) | ORAL | Status: DC
  Administered 2020-05-05 – 2020-05-07 (×7): 25 mg via ORAL
  Filled 2020-05-05 (×7): qty 1

## 2020-05-05 NOTE — Discharge Summary (Signed)
Name: Dean Wilson MRN: [id number] DOB: 1960/12/19 60 y.o. PCP: Dean Chroman, MD  Date of Admission: 05/04/2020 11:31 AM Date of Discharge: 05/07/2020 Attending Physician: Dr. Angelia Wilson  Discharge Diagnosis: Principal Problem:   Acute systolic congestive heart failure [facility]) Active Problems:   Hypertensive emergency   Nocturnal hypoxia   Mild mitral regurgitation   Hypokalemia    Discharge Medications: Allergies as of 05/07/2020      Reactions   Banana Itching   Sunflower Oil Itching   Seeds not oil   Tomato Itching      Medication List    STOP taking these medications   diltiazem 360 MG 24 hr capsule Commonly known as: TIAZAC   lisinopril 20 MG tablet Commonly known as: ZESTRIL     TAKE these medications   Aspirin Low Dose 81 MG EC tablet Generic drug: aspirin Take 81 mg by mouth daily.   carvedilol 25 MG tablet Commonly known as: COREG Take 25 mg by mouth 2 (two) times daily.   clopidogrel 75 MG tablet Commonly known as: PLAVIX Take 75 mg by mouth daily.   Denta 5000 Plus 1.1 % Crea dental cream Generic drug: sodium fluoride Take 1 application by mouth at bedtime.   diclofenac 75 MG EC tablet Commonly known as: VOLTAREN Take 75 mg by mouth at bedtime.   hydrALAZINE 25 MG tablet Commonly known as: APRESOLINE Take 25 mg by mouth 3 (three) times daily.   losartan 50 MG tablet Commonly known as: COZAAR Take 2 tablets (100 mg total) by mouth daily. Start taking on: May 08, 2020   multivitamin tablet Take 1 tablet by mouth daily.   pantoprazole 40 MG tablet Commonly known as: Protonix Take 1 tablet (40 mg total) by mouth daily. What changed: Another medication with the same name was removed. Continue taking this medication, and follow the directions you see here.   potassium chloride SA 20 MEQ tablet Commonly known as: KLOR-CON Take 2 tablets (40 mEq total) by mouth 2 (two) times daily for 14 days. What changed:   how much to  take  when to take this   rosuvastatin 20 MG tablet Commonly known as: CRESTOR Take 1 tablet (20 mg total) by mouth daily. Start taking on: May 08, 2020 What changed:   medication strength  how much to take   spironolactone 25 MG tablet Commonly known as: ALDACTONE Take 1 tablet (25 mg total) by mouth daily. Start taking on: May 08, 2020   torsemide 20 MG tablet Commonly known as: Demadex Take 1 tablet (20 mg total) by mouth daily.       Disposition and follow-up:   Mr.Dean Wilson was discharged from [facility] in Stable condition.  At the hospital follow up visit please address:  1.  Follow-up:  A. New onset HFrEF: Current regimen includes Coreg, Losartan, Spironolactone, Torsemide. Can optimize his regimen with addition of Entresto and a SGLT2 inhibitor. Follow up with Cardiology for repeat Echo and possible catheterization. His creatine was elevated from base line after addition of Torsemide and Losartan. Repeat BMP     B. CVA: on ASA, Plavix and Crestor. Follow up with Neurology.    C. Hypokalemia: on Potassium 40 mEq daily. Recheck BMP   D. Suspected OSA: need sleep study   2.  Labs / imaging needed at time of follow-up: BMP, sleep study  3.  Pending labs/ test needing follow-up: Aldosterone/renin ratio   4.  Medication Changes  Started:  Losartan, Spironolactone, Torsemide   Stopped: Lisinopril, Cardizem  Changed: Potassium 20 mEq to 40 mEq daily   Abx -   End Date:  Follow-up Appointments:  Follow-up Information    Dean Wilson. Go on 05/12/2020.   Specialty: Cardiology Why: AT 9AM. [facility].  FREE valet parking at [employer], off [address] all medications and daily weight log with you. You will see a HF doctor, pharmacist, and nurse-this appt ~1 hour long. Contact information: [address] [phone] mc [place] [facility] Course by problem list:  Severe symptomatic hypertension Patient presented with elevated blood pressure for 200/100.  States that he will longstanding history of uncontrolled blood pressure despite him being adherent with many medication regimens.  Initial presentation he was given hydralazine by the ED providers, this dropped his blood pressure over map goal and first 24 hours.  He was then just given labetalol as needed.  Pressures improved over the 24 hours and he was restarted back on his home lisinopril and carvedilol.  I do suspect this is the cause of his heart failure as well as acute on chronic kidney disease.  Because of his longstanding hypertension and hypo-kalemia without medications a renin Aldo level were done to assess for primary hyperaldosteronism.  Spironolactone was added for hypertension and hypokalemia.  Lisinopril was changed to losartan and will be transition to [facility].  Continue blood pressure control outpatient.  New onset HFrEF Patient with new onset heart failure EF of 40 to 45%.  Patient was increased shortness of breath upon exertion.  With improvement of blood pressure patient's shortness of breath resolved.  Uncertain if this shortness of breath is secondary to deconditioning from his recent hospitalization from a stroke or if this is a acute exacerbation of heart failure secondary to his hypertension.  Echo was performed with an EF of 40 to 45%.  Also some concern for apical thrombus that was difficult to assess by echo, cardiac MRI was then performed, which came back negative for LV thrombus.  However showed possible prior MI in the basal to apical lateral wall and mid inferior wall.  His heart failure regimen was adjusted to Coreg, losartan, spironolactone, Torsemide, which can be optimized with addition of Entresto and a SGLT2 inhibitor.   CVA Patient recently diagnosed with stroke 3 weeks ago.  He states he  was adherent to his Plavix, rosuvastatin, and aspirin.  He was continued on this during his hospitalization  Suspected OSA First night during hospitalization patient became apneic, this improved with CPAP use.  Suspect that this may also be contributing to his longstanding hypertension.  Patient will need sleep study on outpatient basis.  He is sleep apnea during his hospitalization and progressed well.  Discharge Subjective: Patient is sitting on recliner chair during examination.  He appears comfortable and in no acute respiratory distress.  Patient states a few much better.  He has been walking around the room without short of breath or chest pain.  States that he is back to his baseline.  Discharge Exam:   BP (!) 162/93 (BP Location: Left Arm)   Pulse (!) 59   Temp 97.7 F (36.5 C) (Oral)   Resp 18   Ht 6\' 1"  (1.854 m)   Wt 123.3 kg   SpO2 92%   BMI 35.86 kg/m  Constitutional: well-appearing, in no acute distress HENT: normocephalic atraumatic Eyes: conjunctiva non-erythematous Neck: supple Cardiovascular: regular rate and rhythm.  Trace edema bilateral lower extremity Pulmonary/Chest: normal work of breathing on room air, lungs clear to auscultation bilaterally MSK: normal bulk and tone Neurological: alert & oriented Skin: warm and dry  Pertinent Labs, Studies, and Procedures:  CBC Latest Ref Rng & Units 05/07/2020 05/06/2020 05/05/2020  WBC 4.0 - 10.5 K/uL 8.3 8.0 7.3  Hemoglobin 13.0 - 17.0 g/dL 15.4 15.9 14.9  Hematocrit 39.0 - 52.0 % 46.2 47.2 44.9  Platelets 150 - 400 K/uL 223 218 224    CMP Latest Ref Rng & Units 05/07/2020 05/07/2020 05/06/2020  Glucose 70 - 99 mg/dL 97 108(H) 101(H)  BUN 8 - 23 mg/dL 24(H) 24(H) 20  Creatinine 0.61 - 1.24 mg/dL 1.69(H) 1.64(H) 1.35(H)  Sodium 135 - 145 mmol/L 140 139 139  Potassium 3.5 - 5.1 mmol/L 3.4(L) 3.1(L) 2.6(LL)  Chloride 98 - 111 mmol/L 101 100 100  CO2 22 - 32 mmol/L 27 30 30   Calcium 8.9 - 10.3 mg/dL 9.2 9.0 8.9  Total  Protein 6.5 - 8.1 g/dL - - -  Total Bilirubin 0.3 - 1.2 mg/dL - - -  Alkaline Phos 38 - 126 U/L - - -  AST 15 - 41 U/L - - -  ALT 0 - 44 U/L - - -    DG Chest 2 View  Result Date: 05/04/2020 CLINICAL DATA:  Shortness of breath EXAM: CHEST - 2 VIEW COMPARISON:  12/15/2019 FINDINGS: The heart size and mediastinal contours are within normal limits. Both lungs are clear. The visualized skeletal structures are unremarkable. IMPRESSION: No active cardiopulmonary disease. Electronically Signed   By: Kathreen Devoid   On: 05/04/2020 12:25   ECHOCARDIOGRAM COMPLETE  Result Date: 05/05/2020    ECHOCARDIOGRAM REPORT   Patient Name:   LEEMAN JOHNSEY Ringley Date of Exam: 05/05/2020 Medical Rec #:  [id number]           Height:       73.0 in Accession #:    [id number]          Weight:       278.4 lb Date of Birth:  Jun 08, 1960           BSA:          2.477 m Patient Age:    41 years            BP:           165/87 mmHg Patient Gender: M                   HR:           58 bpm. Exam Location:  Inpatient Procedure: 2D Echo, Cardiac Doppler and Color Doppler                REPORT CONTAINS CRITICAL RESULT                   Reported to: Dr. Dareen Piano Reduced EF and abnormal apical findings reported through Epic. Indications:    CHF  History:        Patient has prior history of Echocardiogram examinations, most                 recent 05/17/2014. Stroke, Arrythmias:PSVT; Risk                 Factors:Hypertension.  Sonographer:    [place] Referring Phys: [id number] [place]  J TEGELER  Sonographer Comments: Suboptimal parasternal window. IMPRESSIONS  1. Apical images concerning for LV thrombus. GIven drop in EF and recent CVA, echo contrast performed. On echo images, there is no definite thrombus. There appears to be trabeculation/musculature at the apex, but there is also slow moving contrast at the true apex. Given drop in EF and abnormal LV features, would consider cardiac MRI.Marland Kitchen Left ventricular ejection fraction, by  estimation, is 40 to 45%. The left ventricle has mildly decreased function. The left ventricle demonstrates global hypokinesis. The left ventricular internal cavity size was mildly dilated. There is moderate concentric left ventricular hypertrophy. Left ventricular diastolic parameters are indeterminate. Elevated left ventricular end-diastolic pressure.  2. Right ventricular systolic function is normal. The right ventricular size is normal.  3. A small pericardial effusion is present.  4. The mitral valve is grossly normal. Mild mitral valve regurgitation.  5. The aortic valve is grossly normal. Aortic valve regurgitation is not visualized.  6. The inferior vena cava is normal in size with greater than 50% respiratory variability, suggesting right atrial pressure of 3 mmHg. Comparison(s): Prior images unable to be directly viewed, comparison made by report only. FINDINGS  Left Ventricle: Apical images concerning for LV thrombus. GIven drop in EF and recent CVA, echo contrast performed. On echo images, there is no definite thrombus. There appears to be trabeculation/musculature at the apex, but there is also slow moving contrast at the true apex. Given drop in EF and abnormal LV features, would consider cardiac MRI. Left ventricular ejection fraction, by estimation, is 40 to 45%. The left ventricle has mildly decreased function. The left ventricle demonstrates global hypokinesis. The left ventricular internal cavity size was mildly dilated. There is moderate concentric left ventricular hypertrophy. Left ventricular diastolic parameters are indeterminate. Elevated left ventricular end-diastolic pressure. Right Ventricle: The right ventricular size is normal. No increase in right ventricular wall thickness. Right ventricular systolic function is normal. Left Atrium: Left atrial size was normal in size. Right Atrium: Right atrial size was normal in size. Pericardium: A small pericardial effusion is present. Presence of  pericardial fat pad. Mitral Valve: The mitral valve is grossly normal. There is mild calcification of the mitral valve leaflet(s). Mild to moderate mitral annular calcification. Mild mitral valve regurgitation. MV peak gradient, 5.4 mmHg. The mean mitral valve gradient is 2.0 mmHg. Tricuspid Valve: The tricuspid valve is grossly normal. Tricuspid valve regurgitation is trivial. Aortic Valve: The aortic valve is grossly normal. Aortic valve regurgitation is not visualized. Aortic valve mean gradient measures 5.0 mmHg. Aortic valve peak gradient measures 8.0 mmHg. Aortic valve area, by VTI measures 5.76 cm. Pulmonic Valve: The pulmonic valve was not well visualized. Pulmonic valve regurgitation is trivial. Aorta: The aortic root is normal in size and structure. Venous: The inferior vena cava is normal in size with greater than 50% respiratory variability, suggesting right atrial pressure of 3 mmHg. IAS/Shunts: The interatrial septum was not well visualized.  LEFT VENTRICLE PLAX 2D LVIDd:         5.40 cm      Diastology LVIDs:         4.00 cm      LV e' medial:    3.83 cm/s LV PW:         1.60 cm      LV E/e' medial:  25.2 LV IVS:        1.60 cm      LV e' lateral:   5.12 cm/s LVOT diam:  2.90 cm      LV E/e' lateral: 18.8 LV SV:         184 LV SV Index:   74 LVOT Area:     6.61 cm  LV Volumes (MOD) LV vol d, MOD A2C: 106.0 ml LV vol d, MOD A4C: 147.0 ml LV vol s, MOD A2C: 85.0 ml LV vol s, MOD A4C: 85.6 ml LV SV MOD A2C:     21.0 ml LV SV MOD A4C:     147.0 ml LV SV MOD BP:      43.4 ml RIGHT VENTRICLE RV S prime:     14.30 cm/s  PULMONARY VEINS TAPSE (M-mode): 2.7 cm      A Reversal Duration: 100.00 msec                             A Reversal Velocity: 28.20 cm/s                             Diastolic Velocity:  07.37 cm/s                             S/D Velocity:        0.30                             Systolic Velocity:   10.62 cm/s LEFT ATRIUM             Index       RIGHT ATRIUM           Index LA diam:         4.00 cm 1.62 cm/m  RA Area:     13.40 cm LA Vol (A2C):   71.5 ml 28.87 ml/m RA Volume:   31.90 ml  12.88 ml/m LA Vol (A4C):   32.4 ml 13.08 ml/m LA Biplane Vol: 49.1 ml 19.83 ml/m  AORTIC VALVE                    PULMONIC VALVE AV Area (Vmax):    5.81 cm     PV Vmax:       0.84 m/s AV Area (Vmean):   5.21 cm     PV Vmean:      58.200 cm/s AV Area (VTI):     5.76 cm     PV VTI:        0.201 m AV Vmax:           141.00 cm/s  PV Peak grad:  2.9 mmHg AV Vmean:          106.000 cm/s PV Mean grad:  2.0 mmHg AV VTI:            0.319 m AV Peak Grad:      8.0 mmHg AV Mean Grad:      5.0 mmHg LVOT Vmax:         124.00 cm/s LVOT Vmean:        83.600 cm/s LVOT VTI:          0.278 m LVOT/AV VTI ratio: 0.87  AORTA Ao Asc diam: 2.60 cm MITRAL VALVE MV Area (PHT): 2.55 cm      SHUNTS MV Area VTI:   4.47 cm      Systemic VTI:  0.28 m MV Peak  grad:  5.4 mmHg      Systemic Diam: 2.90 cm MV Mean grad:  2.0 mmHg MV Vmax:       1.16 m/s MV Vmean:      72.5 cm/s MV Decel Time: 298 msec MR Peak grad:    120.6 mmHg MR Mean grad:    81.0 mmHg MR Vmax:         549.00 cm/s MR Vmean:        423.0 cm/s MR PISA:         0.57 cm MR PISA Eff ROA: 2 mm MR PISA Radius:  0.30 cm MV E velocity: 96.50 cm/s MV A velocity: 94.10 cm/s MV E/A ratio:  1.03 Buford Dresser MD Electronically signed by Buford Dresser MD Signature Date/Time: 05/05/2020/12:43:18 PM    Final      Discharge Instructions: Discharge Instructions    Call MD for:  difficulty breathing, headache or visual disturbances   Complete by: As directed    Call MD for:  persistant dizziness or light-headedness   Complete by: As directed    Diet - low sodium heart healthy   Complete by: As directed    Discharge instructions   Complete by: As directed    Mr. Guggenheim,   It is a pleasure taking care of you during this admission.  You were hospitalized for severe hypertension and new diagnosis of heart failure.  Please start taking Torsemide daily for the  next few days. This medication helps remove fluid off body.  Please continue this medication if your leg swelling persists.    If you notice weight gain of more than two or three pounds in a 24-hour period or more than five pounds in a week, it is a sign of worsening heart failure.  Please contact the heart clinic or go to the ED if that happens.  Please taking other medication as instructed in the discharge summary.  Please follow-up with the heart clinic and your primary care doctor as soon as possible.  Take care  Dr. Alfonse Spruce   Increase activity slowly   Complete by: As directed       Signed: Gaylan Gerold, DO 05/07/2020, 3:27 PM   Pager: [phone]

## 2020-05-05 NOTE — Progress Notes (Signed)
   05/04/20 2123  Vitals  Temp 98.4 F (36.9 C)  Temp Source Oral  BP (!) 161/68  MAP (mmHg) 95  BP Location Right Arm  BP Method Automatic  Patient Position (if appropriate) Lying  Pulse Rate (!) 56  ECG Heart Rate (!) 56  Resp 15  Level of Consciousness  Level of Consciousness Alert  MEWS COLOR  MEWS Score Color Green  Oxygen Therapy  SpO2 97 %  O2 Device Room Air  Height and Weight  Height 6\' 1"  (1.854 m)  Weight 125.1 kg  BSA (Calculated - sq m) 2.54 sq meters  BMI (Calculated) 36.41  Weight in (lb) to have BMI = 25 189.1  MEWS Score  MEWS Temp 0  MEWS Systolic 0  MEWS Pulse 0  MEWS RR 0  MEWS LOC 0  MEWS Score 0  Admitted pt to rm 3E19 from ED, pt is alert and oriented x 4, denies pain at this time, oriented to room, call bell placed within reach. Placed on cardiac monitor, CCMD made aware.

## 2020-05-05 NOTE — Progress Notes (Signed)
HD#0 Subjective:  Overnight Events: Admitted  Dean Wilson states he feels better overall, his breathing is improved significantly.  States he was short of breath when walking certain distances since his CVA.  We discussed with him that we think that his new heart failure is due to his severe blood pressure.  He notes he had this is all his life and has been difficult to control medications.  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  We discussed that we will continue to give him diuretics and to continue to manage his blood pressure.  Objective:  Vital signs in last 24 hours: Vitals:   05/05/20 1000 05/05/20 1100 05/05/20 1146 05/05/20 1200  BP:   (!) 191/105 (!) 191/120  Pulse: 67 66 65   Resp: 18 13  14   Temp:   98.4 F (36.9 C)   TempSrc:   Oral   SpO2: 98% 100% 98%   Weight:      Height:       Supplemental O2: Room Air SpO2: 98 %   Physical Exam:  Constitutional: Well-appearing sitting upright in recliner in no acute distress HENT: normocephalic atraumatic Eyes: conjunctiva non-erythematous Neck: supple Cardiovascular: regular rate and rhythm, distant heart sounds, difficult to assess. Pulmonary/Chest: normal work of breathing on room air, lungs clear to auscultation bilaterally Abdominal: soft, non-tender MSK: normal bulk and tone.  1-2+ lower extremity edema Neurological: alert & oriented x 3 Skin: warm and dry Psych: Normal mood  Filed Weights   05/04/20 1815 05/04/20 2123 05/05/20 0411  Weight: 108.9 kg 125.1 kg 126.3 kg     Intake/Output Summary (Last 24 hours) at 05/05/2020 1348 Last data filed at 05/05/2020 1257 Gross per 24 hour  Intake 714.99 ml  Output 1920 ml  Net -1205.01 ml   Net IO Since Admission: -1,205.01 mL [05/05/20 1348]  Pertinent Labs: CBC Latest Ref Rng & Units 05/05/2020 05/04/2020 05/04/2020  WBC 4.0 - 10.5 K/uL 7.3 8.9 7.7  Hemoglobin 13.0 - 17.0 g/dL 14.9 15.9 16.2  Hematocrit  39.0 - 52.0 % 44.9 47.8 48.3  Platelets 150 - 400 K/uL 224 251 238    CMP Latest Ref Rng & Units 05/05/2020 05/04/2020 05/04/2020  Glucose 70 - 99 mg/dL 99 - 117(H)  BUN 8 - 23 mg/dL 20 - 24(H)  Creatinine 0.61 - 1.24 mg/dL 1.26(H) 1.35(H) 1.37(H)  Sodium 135 - 145 mmol/L 139 - 141  Potassium 3.5 - 5.1 mmol/L 2.7(LL) - 3.1(L)  Chloride 98 - 111 mmol/L 102 - 101  CO2 22 - 32 mmol/L 30 - 31  Calcium 8.9 - 10.3 mg/dL 8.9 - 9.3  Total Protein 6.5 - 8.1 g/dL 6.2(L) - 6.7  Total Bilirubin 0.3 - 1.2 mg/dL 1.0 - 0.8  Alkaline Phos 38 - 126 U/L 50 - 55  AST 15 - 41 U/L 18 - 22  ALT 0 - 44 U/L 21 - 21    Imaging: ECHOCARDIOGRAM COMPLETE  Result Date: 05/05/2020    ECHOCARDIOGRAM REPORT   Patient Name:   Dean Wilson Schappell Date of Exam: 05/05/2020 Medical Rec #:  [id number]           Height:       73.0 in Accession #:    [id number]          Weight:       278.4 lb Date of Birth:  Apr 10, 1960  BSA:          2.477 m Patient Age:    60 years            BP:           165/87 mmHg Patient Gender: M                   HR:           58 bpm. Exam Location:  Inpatient Procedure: 2D Echo, Cardiac Doppler and Color Doppler                REPORT CONTAINS CRITICAL RESULT                   Reported to: Dr. Dareen Piano Reduced EF and abnormal apical findings reported through Epic. Indications:    CHF  History:        Patient has prior history of Echocardiogram examinations, most                 recent 05/17/2014. Stroke, Arrythmias:PSVT; Risk                 Factors:Hypertension.  Sonographer:    Luisa Hart RDCS Referring Phys: [id number] Courtney Paris  Sonographer Comments: Suboptimal parasternal window. IMPRESSIONS  1. Apical images concerning for LV thrombus. GIven drop in EF and recent CVA, echo contrast performed. On echo images, there is no definite thrombus. There appears to be trabeculation/musculature at the apex, but there is also slow moving contrast at the true apex. Given drop in EF and abnormal LV  features, would consider cardiac MRI.Marland Kitchen Left ventricular ejection fraction, by estimation, is 40 to 45%. The left ventricle has mildly decreased function. The left ventricle demonstrates global hypokinesis. The left ventricular internal cavity size was mildly dilated. There is moderate concentric left ventricular hypertrophy. Left ventricular diastolic parameters are indeterminate. Elevated left ventricular end-diastolic pressure.  2. Right ventricular systolic function is normal. The right ventricular size is normal.  3. A small pericardial effusion is present.  4. The mitral valve is grossly normal. Mild mitral valve regurgitation.  5. The aortic valve is grossly normal. Aortic valve regurgitation is not visualized.  6. The inferior vena cava is normal in size with greater than 50% respiratory variability, suggesting right atrial pressure of 3 mmHg. Comparison(s): Prior images unable to be directly viewed, comparison made by report only. FINDINGS  Left Ventricle: Apical images concerning for LV thrombus. GIven drop in EF and recent CVA, echo contrast performed. On echo images, there is no definite thrombus. There appears to be trabeculation/musculature at the apex, but there is also slow moving contrast at the true apex. Given drop in EF and abnormal LV features, would consider cardiac MRI. Left ventricular ejection fraction, by estimation, is 40 to 45%. The left ventricle has mildly decreased function. The left ventricle demonstrates global hypokinesis. The left ventricular internal cavity size was mildly dilated. There is moderate concentric left ventricular hypertrophy. Left ventricular diastolic parameters are indeterminate. Elevated left ventricular end-diastolic pressure. Right Ventricle: The right ventricular size is normal. No increase in right ventricular wall thickness. Right ventricular systolic function is normal. Left Atrium: Left atrial size was normal in size. Right Atrium: Right atrial size was  normal in size. Pericardium: A small pericardial effusion is present. Presence of pericardial fat pad. Mitral Valve: The mitral valve is grossly normal. There is mild calcification of the mitral valve leaflet(s). Mild to moderate mitral annular calcification. Mild mitral valve regurgitation.  MV peak gradient, 5.4 mmHg. The mean mitral valve gradient is 2.0 mmHg. Tricuspid Valve: The tricuspid valve is grossly normal. Tricuspid valve regurgitation is trivial. Aortic Valve: The aortic valve is grossly normal. Aortic valve regurgitation is not visualized. Aortic valve mean gradient measures 5.0 mmHg. Aortic valve peak gradient measures 8.0 mmHg. Aortic valve area, by VTI measures 5.76 cm. Pulmonic Valve: The pulmonic valve was not well visualized. Pulmonic valve regurgitation is trivial. Aorta: The aortic root is normal in size and structure. Venous: The inferior vena cava is normal in size with greater than 50% respiratory variability, suggesting right atrial pressure of 3 mmHg. IAS/Shunts: The interatrial septum was not well visualized.  LEFT VENTRICLE PLAX 2D LVIDd:         5.40 cm      Diastology LVIDs:         4.00 cm      LV e' medial:    3.83 cm/s LV PW:         1.60 cm      LV E/e' medial:  25.2 LV IVS:        1.60 cm      LV e' lateral:   5.12 cm/s LVOT diam:     2.90 cm      LV E/e' lateral: 18.8 LV SV:         184 LV SV Index:   74 LVOT Area:     6.61 cm  LV Volumes (MOD) LV vol d, MOD A2C: 106.0 ml LV vol d, MOD A4C: 147.0 ml LV vol s, MOD A2C: 85.0 ml LV vol s, MOD A4C: 85.6 ml LV SV MOD A2C:     21.0 ml LV SV MOD A4C:     147.0 ml LV SV MOD BP:      43.4 ml RIGHT VENTRICLE RV S prime:     14.30 cm/s  PULMONARY VEINS TAPSE (M-mode): 2.7 cm      A Reversal Duration: 100.00 msec                             A Reversal Velocity: 28.20 cm/s                             Diastolic Velocity:  38.75 cm/s                             S/D Velocity:        0.30                             Systolic Velocity:   64.33  cm/s LEFT ATRIUM             Index       RIGHT ATRIUM           Index LA diam:        4.00 cm 1.62 cm/m  RA Area:     13.40 cm LA Vol (A2C):   71.5 ml 28.87 ml/m RA Volume:   31.90 ml  12.88 ml/m LA Vol (A4C):   32.4 ml 13.08 ml/m LA Biplane Vol: 49.1 ml 19.83 ml/m  AORTIC VALVE                    PULMONIC VALVE AV Area (Vmax):    5.81  cm     PV Vmax:       0.84 m/s AV Area (Vmean):   5.21 cm     PV Vmean:      58.200 cm/s AV Area (VTI):     5.76 cm     PV VTI:        0.201 m AV Vmax:           141.00 cm/s  PV Peak grad:  2.9 mmHg AV Vmean:          106.000 cm/s PV Mean grad:  2.0 mmHg AV VTI:            0.319 m AV Peak Grad:      8.0 mmHg AV Mean Grad:      5.0 mmHg LVOT Vmax:         124.00 cm/s LVOT Vmean:        83.600 cm/s LVOT VTI:          0.278 m LVOT/AV VTI ratio: 0.87  AORTA Ao Asc diam: 2.60 cm MITRAL VALVE MV Area (PHT): 2.55 cm      SHUNTS MV Area VTI:   4.47 cm      Systemic VTI:  0.28 m MV Peak grad:  5.4 mmHg      Systemic Diam: 2.90 cm MV Mean grad:  2.0 mmHg MV Vmax:       1.16 m/s MV Vmean:      72.5 cm/s MV Decel Time: 298 msec MR Peak grad:    120.6 mmHg MR Mean grad:    81.0 mmHg MR Vmax:         549.00 cm/s MR Vmean:        423.0 cm/s MR PISA:         0.57 cm MR PISA Eff ROA: 2 mm MR PISA Radius:  0.30 cm MV E velocity: 96.50 cm/s MV A velocity: 94.10 cm/s MV E/A ratio:  1.03 Buford Dresser MD Electronically signed by Buford Dresser MD Signature Date/Time: 05/05/2020/12:43:18 PM    Final     Assessment/Plan:   Active Problems:   Heart failure [facility])   Hypertensive emergency   Patient Summary: Dean Wilson is a 60 y.o. with pertinent PMH of CVA (04/27/20), HTN, HLD?, SVT?, GERD, who presented with acute onset shortness of breath and admit for severe hypertension and concern for new onset heart failure on hospital day 0  Severe Symptomatic HTN Troponemia, demand We will continue patient on IV labetalol and restart lisinopril 20 mg.  Plan to add  blood pressure medications 1 at a time.  Patient has had persistent hypertension hypokalemia in the past, will perform Aldo renin to assess for primary hyper aldosteronism.  Also suspect patient has OSA that is contributing, please see plan as per below. -Goal blood pressure 160/90. -As needed labetalol for pressures greater than 180/100 -Aldo/renin pending -We will restart patient's carvedilol, lisinopril this afternoon. -Continue to hold hydralazine.  Would recommend continue to hold hydralazine as does have variable effects  HFrEF - EF-40 to 45% Patient with new onset heart failure, EF of 40 to 45%.  No prior for comparison.  Patient does have global hypokinesis of left ventricle.  Suspect that this is secondary to his severe symptomatic hypertension, he will need repeat echo to ensure that this resolves.  Patient does appear to have apical images concern for LV thrombus per Dr. Harrell Gave, cardiologist who read patient's echo.  As such she recommended cardiac MRI.  We will order  this while he is admitted. - We will give furosemide 20 mg IV - Cardiac MRI pending - Strict ins and outs, daily weights  Acute on chronic kidney disease Baseline creatinine of 1.11 -1.48 Suspect patient has CKD secondary to his persistent hypertension.  See work-up as per above.  Creatinine at baseline.  -Avoid nephrotoxic medications -We will restart his lisinopril  CVA: HLD: Continue home medications, if patient does have LV thrombus would be etiology for his recent CVA. - ASA and Plavix  - Continue Tele - continue rosuvastatin 10 mg daily  Hx of SVT  Bradycardia Patient with history of SVT, patient bradycardic on admission and with recent new diagnosis of heart failure well-controlled diltiazem.  Patient does become tachycardic would restart immediately. -Hold diltiazem 360 mg -Check electrolytes and replete as needed -Continue telemetry  Suspected OSA Patient will need outpatient follow-up for  sleep study. -Continue CPAP while here at night  Diet: Heart Healthy IVF: None,None VTE: Enoxaparin Code: Full   Dispo: Anticipated discharge to Home in 3 days pending further workup and evaluation of severe symptomatic hypertension.   Sanjuana Letters DO Internal Medicine Resident PGY-1 Pager [phone] Please contact the on call pager after 5 pm and on weekends at [phone].

## 2020-05-05 NOTE — Progress Notes (Addendum)
Pt BP-180-190's-90-100's, HR 71, MAP-129; Round MD stated OK to give PRN Labetalol and continue to monitor.   1250: Follow up on pt BP; Remains 190/100's; MD notified; No new orders placed.

## 2020-05-05 NOTE — Progress Notes (Signed)
RT set up CPAP with 4L O2 bled into circuit. Patient tolerating settings well at this time. RT will monitor as needed.

## 2020-05-05 NOTE — Progress Notes (Addendum)
  Date: 05/05/2020  Patient name: Dean Wilson  Medical record number: [id number]  Date of birth: Jun 29, 1960   I have seen and evaluated Theressa Stamps and discussed their care with the Residency Team.  In brief, patient is a 60 year old male with a past medical history of recent CVA (April 27, 2020), hypertension, GERD who presented to the ED with worsening shortness of breath x1 day  Patient was recent discharge from the hospital needs and for a CVA.  Patient states that since the discharge he has been feeling well and went to work earlier this week from Monday to Wednesday.  He did note that his blood pressures have been running high with systolics in the 076K despite taking his antihypertensive medications.  Yesterday morning, patient noted that he had new onset shortness of breath which was worse with exertion as well as some bilateral lower extremity edema.  No chest pain, no palpitations, no diaphoresis, no lightheadedness, no syncope, no focal weakness, no tingling or numbness, no headache, no blurry vision, no nausea or vomiting, no diarrhea, no abdominal pain.  He came to the ED for further evaluation.  Today, patient states that his shortness of breath has improved significantly.  He still notes that he has elevated blood pressures with systolics in the 088P today.  PMHx, Fam Hx, and/or Soc Hx : As per resident admit note  Vitals:   05/05/20 1146 05/05/20 1200  BP: (!) 191/105 (!) 191/120  Pulse: 65   Resp:  14  Temp: 98.4 F (36.9 C)   SpO2: 98%    General: Awake, alert, oriented x3, NAD CVS: Regular rhythm, normal heart sounds Lungs: CTA bilaterally Abdomen: Soft, nontender, nondistended, normoactive bowel sounds Extremities: 1+ bilateral lower extremity pitting edema noted, nontender to palpation HEENT: Normocephalic, atraumatic Psych: Normal mood and affect Skin: Warm and dry   Assessment and Plan: I have seen and evaluated the patient as outlined above. I  agree with the formulated Assessment and Plan as detailed in the residents' note, with the following changes:   1.  Hypertensive urgency: -Patient presented to the ED with elevated systolic blood pressures in the 190s to 200s as well as progressively worsening shortness of breath x1 day.  Patient states that his blood pressures have been chronically uncontrolled and that he has been working with his PCP on this problem.  He states that he is adherent to his medications.  Patient was noted to have bilateral lower extremity edema on admission and this combined with his uncontrolled hypertension and mild troponinemia is consistent with hypertensive emergency. -Chest x-ray showed no pulmonary edema -2D echo done today showed reduced EF of 40 to 45% and possible LV thrombus.  Will obtain cardiac MRI for further evaluation.  I suspect that the reduced EF is secondary to hypertensive cardiomyopathy. -Continue with Lasix 20 mg IV daily -Monitor strict I's and O's and daily weights -We will restart patient's carvedilol and lisinopril today and continue labetalol as needed for SBP greater than 180. -Goal SBP for today was approximately 160 -Given patient's chronically uncontrolled hypertension and hypokalemia on presentation I am concerned for possible hyperaldosteronism.  Will check renin/Aldo ratio -Patient's creatinine appears to be at baseline.  We will continue to monitor BMP closely -No further work-up at this time  Aldine Contes, MD 3/31/20222:37 PM

## 2020-05-05 NOTE — Progress Notes (Signed)
Pt's oxygen sat dropped to 70's while asleep, will go back up to the 90's when awaken. pt placed on 2L of oxygen via [place], pt was also observed having apneic episode when asleep. IMTS on call made aware.

## 2020-05-05 NOTE — Progress Notes (Signed)
Pt placed on CPAP by RT per MD order,  tolerating well.

## 2020-05-05 NOTE — Progress Notes (Signed)
   05/05/20 0516  Provider Notification  Provider Name/Title Dr. Iona Beard  Date Provider Notified 05/05/20  Time Provider Notified 0500  Notification Type Page  Notification Reason Critical result  Test performed and critical result potassium 2.7  Date Critical Result Received 05/05/20  Time Critical Result Received 0455  Provider response See new orders  Date of Provider Response 05/05/20  Time of Provider Response [phone]

## 2020-05-06 ENCOUNTER — Encounter (HOSPITAL_COMMUNITY): Payer: Self-pay | Admitting: Internal Medicine

## 2020-05-06 ENCOUNTER — Inpatient Hospital Stay (HOSPITAL_COMMUNITY): Payer: BC Managed Care – PPO

## 2020-05-06 ENCOUNTER — Telehealth (HOSPITAL_COMMUNITY): Payer: Self-pay

## 2020-05-06 DIAGNOSIS — G4734 Idiopathic sleep related nonobstructive alveolar hypoventilation: Secondary | ICD-10-CM | POA: Diagnosis present

## 2020-05-06 DIAGNOSIS — E876 Hypokalemia: Secondary | ICD-10-CM | POA: Diagnosis present

## 2020-05-06 DIAGNOSIS — I5021 Acute systolic (congestive) heart failure: Secondary | ICD-10-CM | POA: Diagnosis present

## 2020-05-06 DIAGNOSIS — I340 Nonrheumatic mitral (valve) insufficiency: Secondary | ICD-10-CM | POA: Diagnosis present

## 2020-05-06 LAB — CBC
HCT: 47.2 % (ref 39.0–52.0)
Hemoglobin: 15.9 g/dL (ref 13.0–17.0)
MCH: 28.8 pg (ref 26.0–34.0)
MCHC: 33.7 g/dL (ref 30.0–36.0)
MCV: 85.4 fL (ref 80.0–100.0)
Platelets: 218 10*3/uL (ref 150–400)
RBC: 5.53 MIL/uL (ref 4.22–5.81)
RDW: 14.9 % (ref 11.5–15.5)
WBC: 8 10*3/uL (ref 4.0–10.5)
nRBC: 0 % (ref 0.0–0.2)

## 2020-05-06 LAB — BASIC METABOLIC PANEL
Anion gap: 9 (ref 5–15)
BUN: 20 mg/dL (ref 8–23)
CO2: 30 mmol/L (ref 22–32)
Calcium: 8.9 mg/dL (ref 8.9–10.3)
Chloride: 100 mmol/L (ref 98–111)
Creatinine, Ser: 1.35 mg/dL — ABNORMAL HIGH (ref 0.61–1.24)
GFR, Estimated: 60 mL/min — ABNORMAL LOW (ref >60)
Glucose, Bld: 101 mg/dL — ABNORMAL HIGH (ref 70–99)
Potassium: 2.6 mmol/L — CL (ref 3.5–5.1)
Sodium: 139 mmol/L (ref 135–145)

## 2020-05-06 LAB — PHOSPHORUS: Phosphorus: 3.8 mg/dL (ref 2.5–4.6)

## 2020-05-06 LAB — MAGNESIUM: Magnesium: 2.1 mg/dL (ref 1.7–2.4)

## 2020-05-06 IMAGING — MR MR CARD MORPHOLOGY WO/W CM
45 of 48 series · 45 of 48 positions shown · IV contrast (Gadavist)
Comparison: none

CLINICAL DATA: 60M with new heart failure. Possible apical thrombus
on echo

EXAM:
CARDIAC MRI
TECHNIQUE: The patient was scanned on a 1.5 Tesla Siemens magnet. A dedicated
cardiac coil was used. Functional imaging was done using Fiesta
sequences. [DATE], and 4 chamber views were done to assess for RWMA's.
Modified PALLADINO rule using a short axis stack was used to
calculate an ejection fraction on a dedicated work station using
Circle software. The patient received 15 cc of Gadavist. After 10
minutes inversion recovery sequences were used to assess for
infiltration and scar tissue.
CONTRAST:  15 cc  of Gadavist

[Series 4: [id number] · axial · 8.0mm · 1.56mm/px · 1 of 16 slices shown]
[im 1/16]
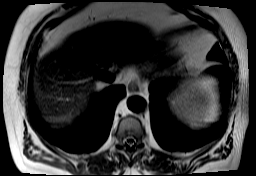

[Series 8: bSSFP · oblique · 8.0mm · 1.83mm/px · 1 of 25 slices shown (1 of 27)]
[im 1/25]
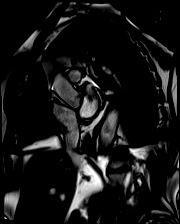

[Series 9: bSSFP · oblique · 8.0mm · 1.83mm/px · 1 of 25 slices shown (2 of 27)]
[im 1/25]
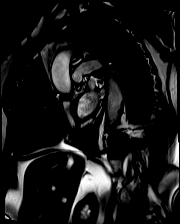

[Series 10: bSSFP · oblique · 8.0mm · 1.83mm/px · 1 of 25 slices shown (3 of 27)]
[im 1/25]
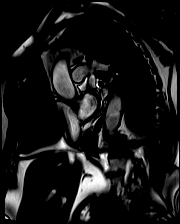

[Series 11: bSSFP · oblique · 8.0mm · 1.83mm/px · 1 of 25 slices shown (4 of 27)]
[im 1/25]
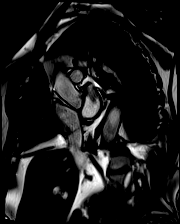

[Series 12: bSSFP · oblique · 8.0mm · 1.83mm/px · 1 of 25 slices shown (5 of 27)]
[im 1/25]
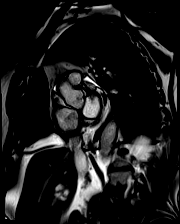

[Series 13: bSSFP · oblique · 8.0mm · 1.83mm/px · 1 of 25 slices shown (6 of 27)]
[im 1/25]
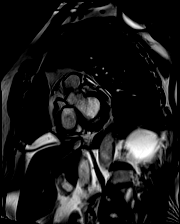

[Series 14: bSSFP · oblique · 8.0mm · 1.83mm/px · 1 of 25 slices shown (7 of 27)]
[im 1/25]
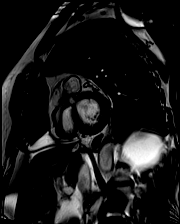

[Series 15: bSSFP · oblique · 8.0mm · 1.83mm/px · 1 of 25 slices shown (8 of 27)]
[im 1/25]
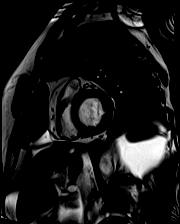

[Series 16: bSSFP · oblique · 8.0mm · 1.83mm/px · 1 of 25 slices shown (9 of 27)]
[im 1/25]
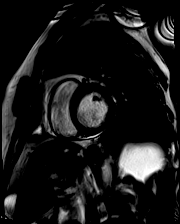

[Series 17: bSSFP · oblique · 8.0mm · 1.83mm/px · 1 of 25 slices shown (10 of 27)]
[im 1/25]
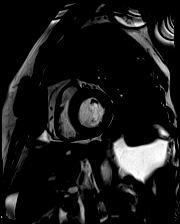

[Series 18: bSSFP · oblique · 8.0mm · 1.83mm/px · 1 of 25 slices shown (11 of 27)]
[im 1/25]
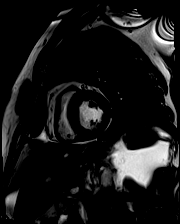

[Series 19: bSSFP · oblique · 8.0mm · 1.83mm/px · 1 of 25 slices shown (12 of 27)]
[im 1/25]
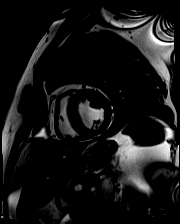

[Series 20: bSSFP · oblique · 8.0mm · 1.83mm/px · 1 of 25 slices shown (13 of 27)]
[im 1/25]
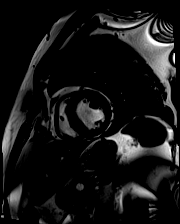

[Series 21: bSSFP · oblique · 8.0mm · 1.83mm/px · 1 of 25 slices shown (14 of 27)]
[im 1/25]
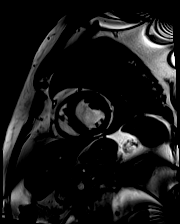

[Series 22: bSSFP · oblique · 8.0mm · 1.83mm/px · 1 of 25 slices shown (15 of 27)]
[im 1/25]
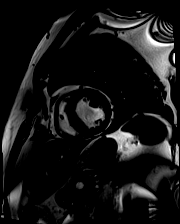

[Series 23: bSSFP · oblique · 8.0mm · 1.83mm/px · 1 of 25 slices shown (16 of 27)]
[im 1/25]
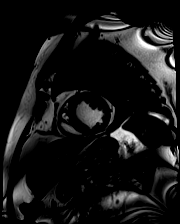

[Series 24: bSSFP · oblique · 8.0mm · 1.83mm/px · 1 of 25 slices shown (17 of 27)]
[im 1/25]
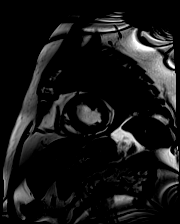

[Series 25: bSSFP · oblique · 8.0mm · 1.83mm/px · 1 of 25 slices shown (18 of 27)]
[im 1/25]
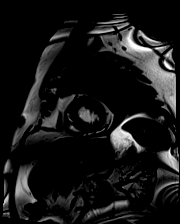

[Series 26: bSSFP · oblique · 8.0mm · 1.83mm/px · 1 of 25 slices shown (19 of 27)]
[im 1/25]
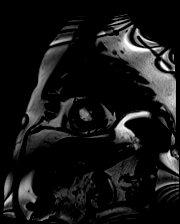

[Series 27: bSSFP · oblique · 8.0mm · 1.83mm/px · 1 of 25 slices shown (20 of 27)]
[im 1/25]
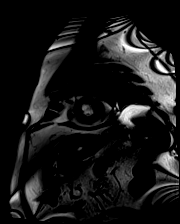

[Series 28: bSSFP · oblique · 8.0mm · 1.83mm/px · 1 of 25 slices shown (21 of 27)]
[im 1/25]
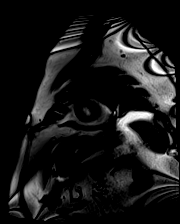

[Series 29: bSSFP · oblique · 8.0mm · 1.83mm/px · 1 of 25 slices shown (22 of 27)]
[im 1/25]
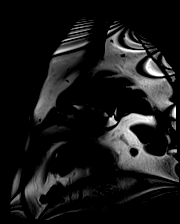

[Series 30: bSSFP · oblique · 8.0mm · 1.83mm/px · 1 of 25 slices shown (23 of 27)]
[im 1/25]
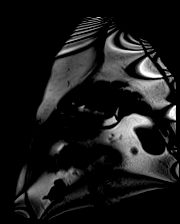

[Series 31: (id)_long_t1 · oblique · 8.0mm · 2.08mm/px · 1 of 24 slices shown]
[im 1/24]
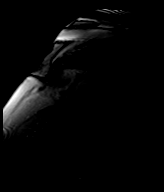

[Series 32: (id)_long_t1_moco · oblique · 8.0mm · 2.08mm/px · 1 of 24 slices shown]
[im 1/24]
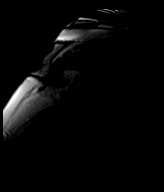

[Series 35: (id)_trufi · oblique · 8.0mm · 2.08mm/px · 1 of 9 slices shown]
[im 1/9]
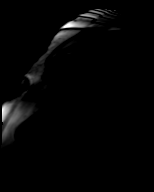

[Series 36: (id)_trufi_moco · oblique · 8.0mm · 2.08mm/px · 1 of 9 slices shown]
[im 1/9]
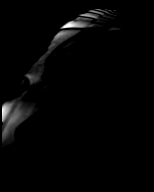

[Series 37: (id)_trufi_moco_t2 · oblique · 8.0mm · 2.08mm/px · 1 of 3 slices shown]
[im 1/3]
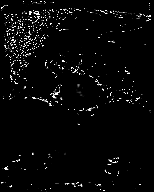

[Series 39: bSSFP · oblique · 6.0mm · 1.41mm/px · 1 of 25 slices shown (24 of 27)]
[im 1/25]
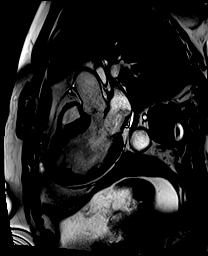

[Series 40: bSSFP · oblique · 6.0mm · 1.41mm/px · 1 of 25 slices shown (25 of 27)]
[im 1/25]
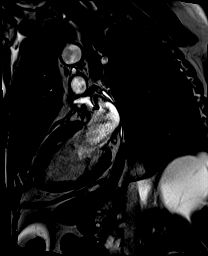

[Series 41: bSSFP · axial · 6.0mm · 1.56mm/px · 1 of 25 slices shown (26 of 27)]
[im 1/25]
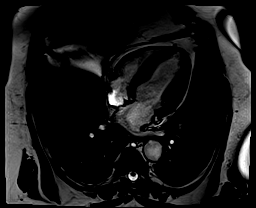

[Series 49: pre short axis · oblique · non-contrast · 8.0mm · 2.38mm/px · 1 of 10 slices shown (1 of 6)]
[im 1/10]
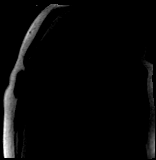

[Series 50: pre short axis · oblique · non-contrast · 8.0mm · 2.38mm/px · 1 of 10 slices shown (2 of 6)]
[im 1/10]
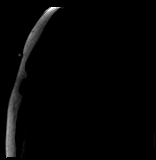

[Series 51: pre short axis · oblique · non-contrast · 8.0mm · 2.38mm/px · 1 of 10 slices shown (3 of 6)]
[im 1/10]
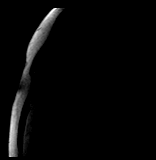

[Series 52: pre short axis · oblique · non-contrast · 8.0mm · 2.38mm/px · 1 of 10 slices shown (4 of 6)]
[im 1/10]
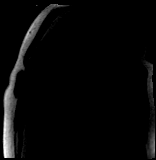

[Series 53: pre short axis · oblique · non-contrast · 8.0mm · 2.38mm/px · 1 of 10 slices shown (5 of 6)]
[im 1/10]
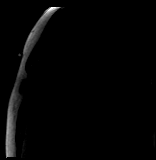

[Series 54: pre short axis · oblique · non-contrast · 8.0mm · 2.38mm/px · 1 of 10 slices shown (6 of 6)]
[im 1/10]
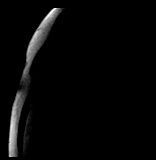

[Series 55: rest short axis · oblique · 8.0mm · 2.38mm/px · 1 of 60 slices shown (1 of 6)]
[im 1/60]
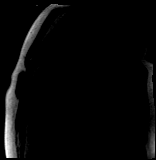

[Series 56: rest short axis · oblique · 8.0mm · 2.38mm/px · 1 of 60 slices shown (2 of 6)]
[im 1/60]
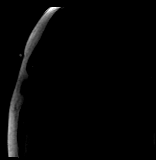

[Series 57: rest short axis · oblique · 8.0mm · 2.38mm/px · 1 of 60 slices shown (3 of 6)]
[im 1/60]
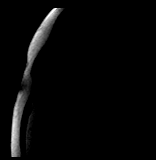

[Series 58: rest short axis · oblique · 8.0mm · 2.38mm/px · 1 of 60 slices shown (4 of 6)]
[im 1/60]
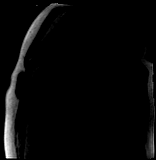

[Series 59: rest short axis · oblique · 8.0mm · 2.38mm/px · 1 of 60 slices shown (5 of 6)]
[im 1/60]
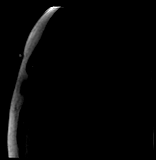

[Series 60: rest short axis · oblique · 8.0mm · 2.38mm/px · 1 of 60 slices shown (6 of 6)]
[im 1/60]
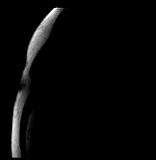

[Series 61: bSSFP · oblique · 6.0mm · 1.92mm/px · 1 of 19 slices shown (27 of 27)]
[im 1/19]
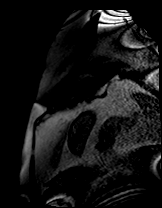

[45 of 48 positions shown; findings below may reference images not displayed]

FINDINGS: Left ventricle:

-Moderate asymmetric hypertrophy measuring up to 14mm in basal
septum (8mm in posterior wall), not meeting criteria for HCM (<15mm)

-Normal size

-Moderate systolic dysfunction

-Normal ECV (24%)

-Subendocardial LGE in basal to apical lateral wall and mid inferior
wall. LGE <50% transmural

-RV insertion site LGE

LV EF: 32% (Normal 56-78%)

Absolute volumes:

LV EDV: 180mL (Normal 77-195 mL)

LV ESV: 123mL (Normal 19-72 mL)

LV SV: 57mL (Normal 51-133 mL)

CO: 3.5L/min (Normal 2.8-8.8 L/min)

Indexed volumes:

LV EDV: 71mL/sq-m (Normal 47-92 mL/sq-m)

LV ESV: 49mL/sq-m (Normal 13-30 mL/sq-m)

LV SV: 23mL/sq-m (Normal 32-62 mL/sq-m)

CI: 1.4L/min/sq-m (Normal 1.7-4.2 L/min/sq-m)

Right ventricle: Small size with normal systolic function

RV EF:  59% (Normal 47-74%)

Absolute volumes:

RV EDV: 94mL (Normal 88-227 mL)

RV ESV: 39mL (Normal 23-103 mL)

RV SV: 56mL (Normal 52-138 mL)

CO: 3.4L/min (Normal 2.8-8.8 L/min)

Indexed volumes:

RV EDV: 37mL/sq-m (Normal 55-105 mL/sq-m)

RV ESV: 15mL/sq-m (Normal 15-43 mL/sq-m)

RV SV: 22mL/sq-m (Normal 32-64 mL/sq-m)

CI: 1.4L/min/sq-m (Normal 1.7-4.2 L/min/sq-m)

Left atrium: Mild enlargement

Right atrium: Normal size

Mitral valve: Trivial regurgitation

Aortic valve: No regurgitation

Tricuspid valve: No regurgitation

Pulmonic valve: No regurgitation

Aorta: Normal proximal ascending aorta

Pericardium: Normal
IMPRESSION: 1. Normal LV size, moderate hypertrophy, and moderate systolic
dysfunction (EF 32%)

2. Subendocardial late gadolinium enhancement consistent with prior
myocardial infarction in the basal to apical lateral wall and mid
inferior wall. LGE is <50% transmural in these regions suggesting
myocardium is viable.

3. RV insertion site LGE, which is a nonspecific finding often seen
in setting of elevated pulmonary pressures

4.  Small RV size with normal systolic function (EF 59%)

5.  No apical thrombus seen

## 2020-05-06 MED ORDER — ROSUVASTATIN CALCIUM 20 MG PO TABS
20.0000 mg | ORAL_TABLET | Freq: Every day | ORAL | Status: DC
  Administered 2020-05-07: 20 mg via ORAL
  Filled 2020-05-06: qty 1

## 2020-05-06 MED ORDER — SPIRONOLACTONE 25 MG PO TABS
25.0000 mg | ORAL_TABLET | Freq: Every day | ORAL | Status: DC
  Administered 2020-05-06 – 2020-05-07 (×2): 25 mg via ORAL
  Filled 2020-05-06 (×2): qty 1

## 2020-05-06 MED ORDER — LOSARTAN POTASSIUM 50 MG PO TABS
100.0000 mg | ORAL_TABLET | Freq: Every day | ORAL | Status: DC
Start: 2020-05-07 — End: 2020-05-07
  Administered 2020-05-07: 100 mg via ORAL
  Filled 2020-05-06: qty 2

## 2020-05-06 MED ORDER — POTASSIUM CHLORIDE CRYS ER 20 MEQ PO TBCR
40.0000 meq | EXTENDED_RELEASE_TABLET | Freq: Two times a day (BID) | ORAL | Status: DC
  Administered 2020-05-06 – 2020-05-07 (×3): 40 meq via ORAL
  Filled 2020-05-06 (×3): qty 2

## 2020-05-06 MED ORDER — GADOBUTROL 1 MMOL/ML IV SOLN
15.0000 mL | Freq: Once | INTRAVENOUS | Status: AC | PRN
  Administered 2020-05-06: 15 mL via INTRAVENOUS

## 2020-05-06 MED ORDER — TORSEMIDE 20 MG PO TABS
20.0000 mg | ORAL_TABLET | Freq: Every day | ORAL | Status: DC
  Administered 2020-05-06: 20 mg via ORAL
  Filled 2020-05-06: qty 1

## 2020-05-06 NOTE — Progress Notes (Signed)
HD#1 Subjective:  Overnight Events: none  Patient sitting on recliner chair during examination.  His wife is also at bedside.  He appears comfortable and in no acute respiratory distress.  States that he is feeling better after the diuresis.  Patient states that he developed the first CVA symptoms as his PCPs office.  Currently have a Zio patch to evaluate for any arrhythmia causing CVA.  Patient states that his blood pressure has been high and low which he had to be on many medications in the past.  States that he was diagnosed with arrhythmia and was started on Coreg, which helped control his blood pressure.  States that he does have plenty of salt in his diet.  Objective:  Vital signs in last 24 hours: Vitals:   05/05/20 2000 05/05/20 2305 05/06/20 0400 05/06/20 0500  BP: (!) 180/104  133/78   Pulse: 71 90 (!) 59   Resp: 12  16   Temp: 97.9 F (36.6 C)  97.7 F (36.5 C)   TempSrc: Oral  Oral   SpO2: 98% 98% 94%   Weight:    124 kg  Height:       Supplemental O2: Room Air SpO2: 94 %   Physical Exam:  Physical Exam Constitutional:      General: He is not in acute distress. HENT:     Head: Normocephalic.  Eyes:     General:        Right eye: No discharge.        Left eye: No discharge.  Cardiovascular:     Rate and Rhythm: Normal rate and regular rhythm.  Pulmonary:     Effort: Pulmonary effort is normal. No respiratory distress.     Breath sounds: Normal breath sounds. No wheezing.  Musculoskeletal:     Right lower leg: No edema.     Left lower leg: No edema.  Skin:    General: Skin is warm.  Neurological:     Mental Status: He is alert.  Psychiatric:        Mood and Affect: Mood normal.     Filed Weights   05/04/20 2123 05/05/20 0411 05/06/20 0500  Weight: 125.1 kg 126.3 kg 124 kg     Intake/Output Summary (Last 24 hours) at 05/06/2020 0605 Last data filed at 05/05/2020 1832 Gross per 24 hour  Intake 710.99 ml  Output 2200 ml  Net -1489.01 ml    Net IO Since Admission: -2,669.01 mL [05/06/20 0605]  Pertinent Labs: CBC Latest Ref Rng & Units 05/06/2020 05/05/2020 05/04/2020  WBC 4.0 - 10.5 K/uL 8.0 7.3 8.9  Hemoglobin 13.0 - 17.0 g/dL 15.9 14.9 15.9  Hematocrit 39.0 - 52.0 % 47.2 44.9 47.8  Platelets 150 - 400 K/uL 218 224 251    CMP Latest Ref Rng & Units 05/06/2020 05/05/2020 05/05/2020  Glucose 70 - 99 mg/dL 101(H) 113(H) 99  BUN 8 - 23 mg/dL 20 20 20   Creatinine 0.61 - 1.24 mg/dL 1.35(H) 1.35(H) 1.26(H)  Sodium 135 - 145 mmol/L 139 138 139  Potassium 3.5 - 5.1 mmol/L 2.6(LL) 3.3(L) 2.7(LL)  Chloride 98 - 111 mmol/L 100 101 102  CO2 22 - 32 mmol/L 30 30 30   Calcium 8.9 - 10.3 mg/dL 8.9 9.0 8.9  Total Protein 6.5 - 8.1 g/dL - - 6.2(L)  Total Bilirubin 0.3 - 1.2 mg/dL - - 1.0  Alkaline Phos 38 - 126 U/L - - 50  AST 15 - 41 U/L - - 18  ALT 0 -  44 U/L - - 21    Imaging: ECHOCARDIOGRAM COMPLETE  Result Date: 05/05/2020    ECHOCARDIOGRAM REPORT   Patient Name:   HARREL FERRONE Geiler Date of Exam: 05/05/2020 Medical Rec #:  [id number]           Height:       73.0 in Accession #:    [id number]          Weight:       278.4 lb Date of Birth:  12-01-1960           BSA:          2.477 m Patient Age:    60 years            BP:           165/87 mmHg Patient Gender: M                   HR:           58 bpm. Exam Location:  Inpatient Procedure: 2D Echo, Cardiac Doppler and Color Doppler                REPORT CONTAINS CRITICAL RESULT                   Reported to: Dr. Dareen Piano Reduced EF and abnormal apical findings reported through Epic. Indications:    CHF  History:        Patient has prior history of Echocardiogram examinations, most                 recent 05/17/2014. Stroke, Arrythmias:PSVT; Risk                 Factors:Hypertension.  Sonographer:    Luisa Hart RDCS Referring Phys: [id number] Courtney Paris  Sonographer Comments: Suboptimal parasternal window. IMPRESSIONS  1. Apical images concerning for LV thrombus. GIven drop in EF and  recent CVA, echo contrast performed. On echo images, there is no definite thrombus. There appears to be trabeculation/musculature at the apex, but there is also slow moving contrast at the true apex. Given drop in EF and abnormal LV features, would consider cardiac MRI.Marland Kitchen Left ventricular ejection fraction, by estimation, is 40 to 45%. The left ventricle has mildly decreased function. The left ventricle demonstrates global hypokinesis. The left ventricular internal cavity size was mildly dilated. There is moderate concentric left ventricular hypertrophy. Left ventricular diastolic parameters are indeterminate. Elevated left ventricular end-diastolic pressure.  2. Right ventricular systolic function is normal. The right ventricular size is normal.  3. A small pericardial effusion is present.  4. The mitral valve is grossly normal. Mild mitral valve regurgitation.  5. The aortic valve is grossly normal. Aortic valve regurgitation is not visualized.  6. The inferior vena cava is normal in size with greater than 50% respiratory variability, suggesting right atrial pressure of 3 mmHg. Comparison(s): Prior images unable to be directly viewed, comparison made by report only. FINDINGS  Left Ventricle: Apical images concerning for LV thrombus. GIven drop in EF and recent CVA, echo contrast performed. On echo images, there is no definite thrombus. There appears to be trabeculation/musculature at the apex, but there is also slow moving contrast at the true apex. Given drop in EF and abnormal LV features, would consider cardiac MRI. Left ventricular ejection fraction, by estimation, is 40 to 45%. The left ventricle has mildly decreased function. The left ventricle demonstrates global hypokinesis. The left ventricular internal cavity size was mildly dilated. There  is moderate concentric left ventricular hypertrophy. Left ventricular diastolic parameters are indeterminate. Elevated left ventricular end-diastolic pressure. Right  Ventricle: The right ventricular size is normal. No increase in right ventricular wall thickness. Right ventricular systolic function is normal. Left Atrium: Left atrial size was normal in size. Right Atrium: Right atrial size was normal in size. Pericardium: A small pericardial effusion is present. Presence of pericardial fat pad. Mitral Valve: The mitral valve is grossly normal. There is mild calcification of the mitral valve leaflet(s). Mild to moderate mitral annular calcification. Mild mitral valve regurgitation. MV peak gradient, 5.4 mmHg. The mean mitral valve gradient is 2.0 mmHg. Tricuspid Valve: The tricuspid valve is grossly normal. Tricuspid valve regurgitation is trivial. Aortic Valve: The aortic valve is grossly normal. Aortic valve regurgitation is not visualized. Aortic valve mean gradient measures 5.0 mmHg. Aortic valve peak gradient measures 8.0 mmHg. Aortic valve area, by VTI measures 5.76 cm. Pulmonic Valve: The pulmonic valve was not well visualized. Pulmonic valve regurgitation is trivial. Aorta: The aortic root is normal in size and structure. Venous: The inferior vena cava is normal in size with greater than 50% respiratory variability, suggesting right atrial pressure of 3 mmHg. IAS/Shunts: The interatrial septum was not well visualized.  LEFT VENTRICLE PLAX 2D LVIDd:         5.40 cm      Diastology LVIDs:         4.00 cm      LV e' medial:    3.83 cm/s LV PW:         1.60 cm      LV E/e' medial:  25.2 LV IVS:        1.60 cm      LV e' lateral:   5.12 cm/s LVOT diam:     2.90 cm      LV E/e' lateral: 18.8 LV SV:         184 LV SV Index:   74 LVOT Area:     6.61 cm  LV Volumes (MOD) LV vol d, MOD A2C: 106.0 ml LV vol d, MOD A4C: 147.0 ml LV vol s, MOD A2C: 85.0 ml LV vol s, MOD A4C: 85.6 ml LV SV MOD A2C:     21.0 ml LV SV MOD A4C:     147.0 ml LV SV MOD BP:      43.4 ml RIGHT VENTRICLE RV S prime:     14.30 cm/s  PULMONARY VEINS TAPSE (M-mode): 2.7 cm      A Reversal Duration: 100.00 msec                              A Reversal Velocity: 28.20 cm/s                             Diastolic Velocity:  99.83 cm/s                             S/D Velocity:        0.30                             Systolic Velocity:   38.25 cm/s LEFT ATRIUM             Index       RIGHT ATRIUM  Index LA diam:        4.00 cm 1.62 cm/m  RA Area:     13.40 cm LA Vol (A2C):   71.5 ml 28.87 ml/m RA Volume:   31.90 ml  12.88 ml/m LA Vol (A4C):   32.4 ml 13.08 ml/m LA Biplane Vol: 49.1 ml 19.83 ml/m  AORTIC VALVE                    PULMONIC VALVE AV Area (Vmax):    5.81 cm     PV Vmax:       0.84 m/s AV Area (Vmean):   5.21 cm     PV Vmean:      58.200 cm/s AV Area (VTI):     5.76 cm     PV VTI:        0.201 m AV Vmax:           141.00 cm/s  PV Peak grad:  2.9 mmHg AV Vmean:          106.000 cm/s PV Mean grad:  2.0 mmHg AV VTI:            0.319 m AV Peak Grad:      8.0 mmHg AV Mean Grad:      5.0 mmHg LVOT Vmax:         124.00 cm/s LVOT Vmean:        83.600 cm/s LVOT VTI:          0.278 m LVOT/AV VTI ratio: 0.87  AORTA Ao Asc diam: 2.60 cm MITRAL VALVE MV Area (PHT): 2.55 cm      SHUNTS MV Area VTI:   4.47 cm      Systemic VTI:  0.28 m MV Peak grad:  5.4 mmHg      Systemic Diam: 2.90 cm MV Mean grad:  2.0 mmHg MV Vmax:       1.16 m/s MV Vmean:      72.5 cm/s MV Decel Time: 298 msec MR Peak grad:    120.6 mmHg MR Mean grad:    81.0 mmHg MR Vmax:         549.00 cm/s MR Vmean:        423.0 cm/s MR PISA:         0.57 cm MR PISA Eff ROA: 2 mm MR PISA Radius:  0.30 cm MV E velocity: 96.50 cm/s MV A velocity: 94.10 cm/s MV E/A ratio:  1.03 Buford Dresser MD Electronically signed by Buford Dresser MD Signature Date/Time: 05/05/2020/12:43:18 PM    Final     Assessment/Plan:   Active Problems:   Heart failure [facility])   Hypertensive emergency   Patient Summary: Dean Abernethy Robertsonis a 60 y.o.with pertinent PMH of CVA (04/27/20), HTN, HLD?, SVT?, GERD, who presented withacute onset shortness  ofbreathand admitted for severe hypertension and new onset heart failure.   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 No prior for comparison.  Patient denies prior history of heart failure. Patient appears euvolemic on exam today.  Will start oral torsemide 20 mg to optimize his volume status.  We will also add spironolactone for his hypokalemia and heart failure.  We will transition lisinopril to losartan with anticipation of starting Entresto.  Patient will also benefit from an SGLT2 inhibitor. -Pending cardiac MRI official read -Torsemide 20 mg daily -Carvedilol 25 mg twice daily -Losartan 100 mg daily -Spironolactone 25 mg daily -  Strict ins and outs, daily weights -BMP daily   Severe Symptomatic HTN His blood pressure improves today.  Patient received 2 doses of 5 mg labetalol yesterday.  Given his persistent hypokalemia and severe hypertension, concerned for hyperaldosteronism.  Checking aldo/renin ratio.  Will start spironolactone 25 mg for his chronic hyperkalemia and new onset heart failure. -Aldo/renin pending -Losartan, Coreg, spironolactone, torsemide -Replete potassium Klor 40 mEq BID x 4   Chronic kidney disease-stable Suspect patient has CKD secondary to his persistent hypertension.  Creatinine at baseline. -Avoid nephrotoxic medications   CVA Continue home medications - ASA and Plavix  - Continue Tele -continue rosuvastatin 20 mg daily   Suspected OSA Patient will need outpatient follow-up for sleep study. -Continue CPAP while here at night   Diet: Heart Healthy IVF: None,None VTE: Enoxaparin Code: Full   Dispo: Anticipated discharge to Home in 3 days pending further workup and evaluation of severe symptomatic hypertension.   Gaylan Gerold, DO 05/06/2020, 6:05 AM Pager: [phone]  Please contact the on call pager after 5 pm and on weekends at [phone].

## 2020-05-06 NOTE — Progress Notes (Signed)
Heart Failure Stewardship Pharmacist Progress Note   PCP: Dean Chroman, MD PCP-Cardiologist: No primary care provider on file.    HPI:  60 yo M with PMH of recent CVA, HTN, and GERD. He presented to the ED on 05/04/20 with shortness of breath. He was then admitted for HTN urgency. An ECHO was done on 05/05/20 and LVEF was 40-45%. ECHO images also concerning for LV thrombus. Pending cMRI.  Current HF Medications:  Carvedilol 25 mg BID Lisinopril 20 mg daily Hydralazine 25 mg TID  Prior to admission HF Medications: Carvedilol 25 mg BID Lisinopril 20 mg daily Hydralazine 25 mg TID  Pertinent Lab Values: . Serum creatinine 1.35, BUN 20, Potassium 2.6, Sodium 139, BNP 329.1, Magnesium 2.1   Vital Signs: . Weight: 273 lbs (admission weight: 275 lbs) . Blood pressure: 140-160/80s  . Heart rate: 70s  Medication Assistance / Insurance Benefits Check: Does the patient have prescription insurance?  Yes Type of insurance plan: [employer]  Outpatient Pharmacy:  Prior to admission outpatient pharmacy: [facility] Is the patient willing to use [facility] pharmacy at discharge? Yes Is the patient willing to transition their outpatient pharmacy to utilize a [facility] outpatient pharmacy?  No - insurance requires mail order or higher copays at retail pharmacies    Assessment: 1. Acute on chronic systolic CHF (EF 26-20%), due to presumed NICM - no ischemic evaluation completed. Pending cMRI for LV thrombus workup. NYHA class II symptoms. - Continue carvedilol 25 mg BID - On lisinopril 20 mg daily - given reduced LVEF, would be a great candidate for Entresto. Can stop lisinopril and start losartan to continue therapy during ACE-i washout period. - Consider starting spironolactone 12.5 mg daily - Consider starting Farxiga 10 mg daily prior to discharge - Continue hydralazine 25 mg TID   Plan: 1) Medication changes recommended at this time: - Stop lisinopril and  start losartan 100 mg daily with intention to optimize to Entresto  2) Patient assistance: Delene Loll copay $50 per month (monthly copay card will lower to $10 per 30-90 day supply) - Farxiga copay $15 per month (monthly copay card will lower to $0 per month)   3)  Education  - To be completed prior to discharge  [name], PharmD, BCPS Heart Failure Stewardship Pharmacist Phone [phone]

## 2020-05-06 NOTE — Progress Notes (Addendum)
Heart Failure Nurse Navigator Progress Note  PCP: Glenda Chroman, MD PCP-Cardiologist: none on file.  Admission Diagnosis: HTN urgency Admitted from: home with spouse in [place], [place]  Presentation:   Dean Wilson presented with SOB and BLE edema/weakness. Interview completed via phone. Pt and spouse interactive, pt states he is feeling better. Pt recently admitted to [facility] for CVA 3/23-per pt no residual from stroke. Pt stated he was unable to get into his truck without significant SOB. During interview pt stated that he felt his BP is now well controlled, and stated his medical team was concerned about his AF in regards to his HF, unsure if he was speaking about inpatient team or his outpatient physician.   ECHO/ LVEF: 40-45%. Completed cMRI 4/1 results pending.  Clinical Course:  Past Medical History:  Diagnosis Date  . Essential hypertension   . GERD (gastroesophageal reflux disease)   . PSVT (paroxysmal supraventricular tachycardia) ([place])   . Stroke [facility])     Social History   Socioeconomic History  . Marital status: Married    Spouse name: Luciano Cutter  . Number of children: 2  . Years of education: Not on file  . Highest education level: High school graduate  Occupational History  . Occupation: Primary school teacher    Employer: [employer]  Tobacco Use  . Smoking status: Former Smoker    Packs/day: 2.00    Years: 25.00    Pack years: 50.00    Types: Cigarettes    Quit date: 01/26/2008    Years since quitting: 12.2  . Smokeless tobacco: Never Used  Vaping Use  . Vaping Use: Never used  Substance and Sexual Activity  . Alcohol use: Never  . Drug use: No  . Sexual activity: Yes    Birth control/protection: None  Other Topics Concern  . Not on file  Social History Narrative  . Not on file   Social Determinants of Health   Financial Resource Strain: Low Risk   . Difficulty of Paying Living Expenses: Not very hard  Food Insecurity: No Food Insecurity   . Worried About Charity fundraiser in the Last Year: Never true  . Ran Out of Food in the Last Year: Never true  Transportation Needs: No Transportation Needs  . Lack of Transportation (Medical): No  . Lack of Transportation (Non-Medical): No  Physical Activity: Not on file  Stress: Not on file  Social Connections: Not on file    High Risk Criteria for Readmission and/or Poor Patient Outcomes:  Heart failure hospital admissions (last 6 months): 1   No Show rate: 67%  Difficult social situation: no  Demonstrates medication adherence: yes  Primary Language: English  Literacy level: able to read/write and comprehend.  Barriers of Care:   -new HF dx -dietary/fluid modifications  Considerations/Referrals:   Referral made to Heart Failure Pharmacist Stewardship: yes, appreciated Referral made to Heart & Vascular TOC clinic: yes, 4/7 @ 9AM. Confirmed transportation.   Items for Follow-up on DC/TOC: -medication optimization -medication cost -HF education  -no cardiologist listed  Pricilla Holm, RN, BSN Heart Failure Nurse Navigator [phone]

## 2020-05-06 NOTE — Telephone Encounter (Signed)
Entry error

## 2020-05-07 DIAGNOSIS — G4736 Sleep related hypoventilation in conditions classified elsewhere: Secondary | ICD-10-CM

## 2020-05-07 DIAGNOSIS — E876 Hypokalemia: Secondary | ICD-10-CM

## 2020-05-07 DIAGNOSIS — I340 Nonrheumatic mitral (valve) insufficiency: Secondary | ICD-10-CM

## 2020-05-07 DIAGNOSIS — I5021 Acute systolic (congestive) heart failure: Secondary | ICD-10-CM

## 2020-05-07 DIAGNOSIS — I160 Hypertensive urgency: Secondary | ICD-10-CM

## 2020-05-07 LAB — CBC
HCT: 46.2 % (ref 39.0–52.0)
Hemoglobin: 15.4 g/dL (ref 13.0–17.0)
MCH: 27.9 pg (ref 26.0–34.0)
MCHC: 33.3 g/dL (ref 30.0–36.0)
MCV: 83.8 fL (ref 80.0–100.0)
Platelets: 223 10*3/uL (ref 150–400)
RBC: 5.51 MIL/uL (ref 4.22–5.81)
RDW: 14.9 % (ref 11.5–15.5)
WBC: 8.3 10*3/uL (ref 4.0–10.5)
nRBC: 0 % (ref 0.0–0.2)

## 2020-05-07 LAB — BASIC METABOLIC PANEL
Anion gap: 9 (ref 5–15)
Anion gap: 12 (ref 5–15)
BUN: 24 mg/dL — ABNORMAL HIGH (ref 8–23)
BUN: 24 mg/dL — ABNORMAL HIGH (ref 8–23)
CO2: 30 mmol/L (ref 22–32)
CO2: 27 mmol/L (ref 22–32)
Calcium: 9 mg/dL (ref 8.9–10.3)
Calcium: 9.2 mg/dL (ref 8.9–10.3)
Chloride: 100 mmol/L (ref 98–111)
Chloride: 101 mmol/L (ref 98–111)
Creatinine, Ser: 1.64 mg/dL — ABNORMAL HIGH (ref 0.61–1.24)
Creatinine, Ser: 1.69 mg/dL — ABNORMAL HIGH (ref 0.61–1.24)
GFR, Estimated: 47 mL/min — ABNORMAL LOW (ref >60)
GFR, Estimated: 46 mL/min — ABNORMAL LOW (ref >60)
Glucose, Bld: 108 mg/dL — ABNORMAL HIGH (ref 70–99)
Glucose, Bld: 97 mg/dL (ref 70–99)
Potassium: 3.1 mmol/L — ABNORMAL LOW (ref 3.5–5.1)
Potassium: 3.4 mmol/L — ABNORMAL LOW (ref 3.5–5.1)
Sodium: 139 mmol/L (ref 135–145)
Sodium: 140 mmol/L (ref 135–145)

## 2020-05-07 MED ORDER — ROSUVASTATIN CALCIUM 20 MG PO TABS: 20.0000 mg | ORAL_TABLET | Freq: Every day | ORAL | 0 refills | Status: DC

## 2020-05-07 MED ORDER — POTASSIUM CHLORIDE CRYS ER 20 MEQ PO TBCR: 40.0000 meq | EXTENDED_RELEASE_TABLET | Freq: Two times a day (BID) | ORAL | 0 refills | Status: DC

## 2020-05-07 MED ORDER — POTASSIUM CHLORIDE CRYS ER 20 MEQ PO TBCR
40.0000 meq | EXTENDED_RELEASE_TABLET | Freq: Once | ORAL | Status: AC
  Administered 2020-05-07: 40 meq via ORAL
  Filled 2020-05-07: qty 2

## 2020-05-07 MED ORDER — SPIRONOLACTONE 25 MG PO TABS: 25.0000 mg | ORAL_TABLET | Freq: Every day | ORAL | 0 refills | Status: DC

## 2020-05-07 MED ORDER — LOSARTAN POTASSIUM 50 MG PO TABS: 100.0000 mg | ORAL_TABLET | Freq: Every day | ORAL | 0 refills | Status: DC

## 2020-05-07 MED ORDER — TORSEMIDE 20 MG PO TABS: 20.0000 mg | ORAL_TABLET | Freq: Every day | ORAL | 0 refills | Status: DC

## 2020-05-12 ENCOUNTER — Encounter (HOSPITAL_COMMUNITY): Payer: BC Managed Care – PPO

## 2020-05-17 NOTE — H&P (View-Only) (Signed)
[facility] [place]  PCP: Jerene Bears Primary Cardiologist: None  HPI:  Dean Wilson is a 60 y.o.  male  with a PMH significant for newly diagnosed HFrEF, Poorly controlled HTN, recent CVA seen in [facility] consult from Dr. Jonah Blue for recent onset HF.   Admitted at outside hospital  04/2020 with acute CVA symptoms found to have multiple small acute cortical/subcortical infarcts within posterior right frontal lobe and right parietooccipital lobes. Also had evidence of small chronic right cerebellar infarct.  During his workup he had an echo with EF 45%, normal RV, normal bubble study, G1DD, normal valves, collapsible IVC.  Carotid duplex without significant stenosis  Started developing shortness of breath shortly after discharge and was admitted for acute systolic CHF at [place].  Started on IV diuresis with good response,  Had a repeat ECHO which demonstrated EF 40-45% and possible LV thrombus with follow up cardiac MRI recommended.  Follow up MRI suggested ischemic heart disease with <50% LGE of the apical and basal lateral and inferior walls. He was started on GDMT and discharged with close follow up.    Really had trouble breathing after his stroke couldn't walk to the car without getting short of breath.  Never had much trouble breathing before then.    Strong afib history mother and sister who is 34 years old.  Says he has been having palpitations and tachycardia for 15 years I see mention of SVT in 2008.     Now walking about 2-3 blocks before having to stop due to shortness of breath, does have some associated chest tightness.  Weight has been going down he's 269 at home today says he was 275 by home scale when he was discharged.    Review of Systems: [y] = yes, [ ]  = no    General: Weight gain [] ; Weight loss [ ] ; Anorexia [ ] ; Fatigue [y]; Fever [ ] ; Chills [ ] ; Weakness Blue.Reese ]   Cardiac: Chest pain/pressure [ ] ; Resting SOB [ ] ;  Exertional SOB [y]; Orthopnea [ ] ; Pedal Edema [] ; Palpitations [ ] ; Syncope [ ] ; Presyncope [ ] ; Paroxysmal nocturnal dyspnea[ ]    Pulmonary: Cough [ ] ; Wheezing[ ] ; Hemoptysis[ ] ; Sputum [ ] ; Snoring Blue.Reese ]   GI: Vomiting[ ] ; Dysphagia[ ] ; Melena[ ] ; Hematochezia [ ] ; Heartburn[y ]; Abdominal pain [ ] ; Constipation [ ] ; Diarrhea [ ] ; BRBPR [ ]    GU: Hematuria[ ] ; Dysuria [ ] ; Nocturia[ ]   Vascular: Pain in legs with walking [ ] ; Pain in feet with lying flat [ ] ; Non-healing sores [ ] ; Stroke Blue.Reese ]; TIA [ ] ; Slurred speech [ ] ;   Neuro: Headaches[ ] ; Vertigo[ ] ; Seizures[ ] ; Paresthesias[ ] ;Blurred vision [ ] ; Diplopia [ ] ; Vision changes [ ]    Ortho/Skin: Arthritis [y]; Joint pain [y]; Muscle pain [ ] ; Joint swelling [ ] ; Back Pain [ ] ; Rash [ ]    Psych: Depression[ ] ; Anxiety[ ]    Heme: Bleeding problems [ ] ; Clotting disorders [ ] ; Anemia [ ]    Endocrine: Diabetes [ ] ; Thyroid dysfunction[ ]    SH:  Social History   Socioeconomic History  . Marital status: Married    Spouse name: Luciano Cutter  . Number of children: 2  . Years of education: Not on file  . Highest education level: High school graduate  Occupational History  . Occupation: Primary school teacher    Employer: [employer]  Tobacco Use  . Smoking status: Former  Smoker    Packs/day: 2.00    Years: 25.00    Pack years: 50.00    Types: Cigarettes    Quit date: 01/26/2008    Years since quitting: 12.3  . Smokeless tobacco: Never Used  Vaping Use  . Vaping Use: Never used  Substance and Sexual Activity  . Alcohol use: Never  . Drug use: No  . Sexual activity: Yes    Birth control/protection: None  Other Topics Concern  . Not on file  Social History Narrative  . Not on file   Social Determinants of Health   Financial Resource Strain: Low Risk   . Difficulty of Paying Living Expenses: Not very hard  Food Insecurity: No Food Insecurity  . Worried About Charity fundraiser in the Last Year: Never true   . Ran Out of Food in the Last Year: Never true  Transportation Needs: No Transportation Needs  . Lack of Transportation (Medical): No  . Lack of Transportation (Non-Medical): No  Physical Activity: Not on file  Stress: Not on file  Social Connections: Not on file  Intimate Partner Violence: Not on file    FH:  Family History  Problem Relation Age of Onset  . Anesthesia problems Neg Hx   . Hypotension Neg Hx   . Malignant hyperthermia Neg Hx   . Pseudochol deficiency Neg Hx     Past Medical History:  Diagnosis Date  . Essential hypertension   . GERD (gastroesophageal reflux disease)   . PSVT (paroxysmal supraventricular tachycardia) ([place])   . Stroke [facility])     Current Outpatient Medications  Medication Sig Dispense Refill  . ASPIRIN LOW DOSE 81 MG EC tablet Take 81 mg by mouth daily.    . carvedilol (COREG) 25 MG tablet Take 25 mg by mouth 2 (two) times daily.    . clopidogrel (PLAVIX) 75 MG tablet Take 75 mg by mouth daily.    . DENTA 5000 PLUS 1.1 % CREA dental cream Take 1 application by mouth at bedtime.    . diclofenac (VOLTAREN) 75 MG EC tablet Take 75 mg by mouth at bedtime.    . hydrALAZINE (APRESOLINE) 25 MG tablet Take 25 mg by mouth 3 (three) times daily.    Marland Kitchen losartan (COZAAR) 50 MG tablet Take 2 tablets (100 mg total) by mouth daily. 30 tablet 0  . Multiple Vitamin (MULTIVITAMIN) tablet Take 1 tablet by mouth daily.    . pantoprazole (PROTONIX) 40 MG tablet Take 1 tablet (40 mg total) by mouth daily. 30 tablet 11  . potassium chloride SA (KLOR-CON) 20 MEQ tablet Take 20 mEq by mouth 2 (two) times daily.    . rosuvastatin (CRESTOR) 20 MG tablet Take 1 tablet (20 mg total) by mouth daily. 30 tablet 0  . spironolactone (ALDACTONE) 25 MG tablet Take 1 tablet (25 mg total) by mouth daily. 30 tablet 0  . torsemide (DEMADEX) 20 MG tablet Take 1 tablet (20 mg total) by mouth daily. 30 tablet 0  . amLODipine (NORVASC) 5 MG tablet Take 1 tablet by mouth daily.     No  current facility-administered medications for this encounter.    Vitals:   05/18/20 0904  BP: (!) 152/100  Pulse: 65  SpO2: 97%  Weight: 125.2 kg (276 lb)    PHYSICAL EXAM: Cardiac: JVD flat, normal rate and rhythm, clear s1 and s2, no murmurs, rubs or gallops, Trace LE edema Pulmonary: CTAB, not in distress Abdominal: non distended abdomen, soft and nontender Psych: Alert,  conversant, in good spirits   ECG; NSR 66 non-specific T wave abnormality Personally reviewed  ASSESSMENT & PLAN: Chronic Systolic CHF: -new diagnosis, does have hx of uncontrolled HTN but also evidence of ischemic heart disease on MRI -ECHO 05/2020 with EF 45%, G1DD, no valvular disease, normal RV -Cardiac MRI 05/2020 with <50% subendocardial LGE of the apical and basal lateral and inferior walls suggestive of prior infarction and viable myocardium -NYHA Class II-III symptoms, mild extra volume on exam today but his weight is going in the right direction and he is monitoring it closely. -PCP already ordered sleep study which I agree with,  -will need weight loss -Arranged for L/RHC to be done in 2 days -Continue spiro 25mg , carvedilol 25mg  BID, asa/plavix, torsemide 20mg  daily -Stop losartan and switch to entresto 49/51, I think this will help get his bp under better control and get that bit of fluid off, I will leave him on amlodipine for now but we can d/c as we titrate up his entresto -will not add sglt2i until after cath but this will also be important for him going forward -place zio monitor today -repeat bmp   Hx of CVA: -Recent diagnosis 04/2020 cortical/subcortical infarcts within posterior right frontal lobe and right parietooccipital lobes -Has the appearance of an embolic stroke and given Cardiac MRI results very suspicious that this came from the LV after an asymptomatic MI the patient had.   -Place Zio to rule out arrhythmia as a cause -Given the current evidence we have I think it is reasonable  to switch him to anticoagulation after cath in lieu of antiplatelet therapy   Follow up 2 weeks post cath in AHF clinic can alter this if needed based on cath findings  Guadlupe Spanish, MD   Patient seen and examined with the above-signed Advanced Practice Provider and/or Housestaff. I personally reviewed laboratory data, imaging studies and relevant notes. I independently examined the patient and formulated the important aspects of the plan. I have edited the note to reflect any of my changes or salient points. I have personally discussed the plan with the patient and/or family.  60 y/o male with recent CVA (suspect cardio-embolic) and subsequently found to have LV dysfunction with EF 45% and apical WMA. 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 Denies anginal symptoms.    General:  Well appearing. No resp difficulty HEENT: normal Neck: supple. JVP 7-8. Carotids 2+ bilat; no bruits. No lymphadenopathy or thryomegaly appreciated. Cor: PMI nondisplaced. Regular rate & rhythm. No rubs, gallops or murmurs. Lungs: clear Abdomen: soft, nontender, nondistended. No hepatosplenomegaly. No bruits or masses. Good bowel sounds. Extremities: no cyanosis, clubbing, rash, 1+ edema Neuro: alert & orientedx3, cranial nerves grossly intact. moves all 4 extremities w/o difficulty. 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 No clear LV thrombus on echo or MRI.   Agree with med changes as above and placing Zio. Discussed need for R/L cath to further evaluate for CAD. Will arrange for later this week.   Post cath would consider at least 3 months of [facility] for embolic CVA with apical infarct.   Glori Bickers, MD  12:56 AM

## 2020-05-17 NOTE — Progress Notes (Addendum)
[facility] [place]  PCP: Jerene Bears Primary Cardiologist: None  HPI:  Dean Wilson is a 60 y.o.  male  with a PMH significant for newly diagnosed HFrEF, Poorly controlled HTN, recent CVA seen in [facility] consult from Dr. Jonah Blue for recent onset HF.   Admitted at outside hospital  04/2020 with acute CVA symptoms found to have multiple small acute cortical/subcortical infarcts within posterior right frontal lobe and right parietooccipital lobes. Also had evidence of small chronic right cerebellar infarct.  During his workup he had an echo with EF 45%, normal RV, normal bubble study, G1DD, normal valves, collapsible IVC.  Carotid duplex without significant stenosis  Started developing shortness of breath shortly after discharge and was admitted for acute systolic CHF at [place].  Started on IV diuresis with good response,  Had a repeat ECHO which demonstrated EF 40-45% and possible LV thrombus with follow up cardiac MRI recommended.  Follow up MRI suggested ischemic heart disease with <50% LGE of the apical and basal lateral and inferior walls. He was started on GDMT and discharged with close follow up.    Really had trouble breathing after his stroke couldn't walk to the car without getting short of breath.  Never had much trouble breathing before then.    Strong afib history mother and sister who is 32 years old.  Says he has been having palpitations and tachycardia for 15 years I see mention of SVT in 2008.     Now walking about 2-3 blocks before having to stop due to shortness of breath, does have some associated chest tightness.  Weight has been going down he's 269 at home today says he was 275 by home scale when he was discharged.    Review of Systems: [y] = yes, [ ]  = no    General: Weight gain [] ; Weight loss [ ] ; Anorexia [ ] ; Fatigue [y]; Fever [ ] ; Chills [ ] ; Weakness Blue.Reese ]   Cardiac: Chest pain/pressure [ ] ; Resting SOB [ ] ;  Exertional SOB [y]; Orthopnea [ ] ; Pedal Edema [] ; Palpitations [ ] ; Syncope [ ] ; Presyncope [ ] ; Paroxysmal nocturnal dyspnea[ ]    Pulmonary: Cough [ ] ; Wheezing[ ] ; Hemoptysis[ ] ; Sputum [ ] ; Snoring Blue.Reese ]   GI: Vomiting[ ] ; Dysphagia[ ] ; Melena[ ] ; Hematochezia [ ] ; Heartburn[y ]; Abdominal pain [ ] ; Constipation [ ] ; Diarrhea [ ] ; BRBPR [ ]    GU: Hematuria[ ] ; Dysuria [ ] ; Nocturia[ ]   Vascular: Pain in legs with walking [ ] ; Pain in feet with lying flat [ ] ; Non-healing sores [ ] ; Stroke Blue.Reese ]; TIA [ ] ; Slurred speech [ ] ;   Neuro: Headaches[ ] ; Vertigo[ ] ; Seizures[ ] ; Paresthesias[ ] ;Blurred vision [ ] ; Diplopia [ ] ; Vision changes [ ]    Ortho/Skin: Arthritis [y]; Joint pain [y]; Muscle pain [ ] ; Joint swelling [ ] ; Back Pain [ ] ; Rash [ ]    Psych: Depression[ ] ; Anxiety[ ]    Heme: Bleeding problems [ ] ; Clotting disorders [ ] ; Anemia [ ]    Endocrine: Diabetes [ ] ; Thyroid dysfunction[ ]    SH:  Social History   Socioeconomic History  . Marital status: Married    Spouse name: Luciano Cutter  . Number of children: 2  . Years of education: Not on file  . Highest education level: High school graduate  Occupational History  . Occupation: Primary school teacher    Employer: [employer]  Tobacco Use  . Smoking status: Former  Smoker    Packs/day: 2.00    Years: 25.00    Pack years: 50.00    Types: Cigarettes    Quit date: 01/26/2008    Years since quitting: 12.3  . Smokeless tobacco: Never Used  Vaping Use  . Vaping Use: Never used  Substance and Sexual Activity  . Alcohol use: Never  . Drug use: No  . Sexual activity: Yes    Birth control/protection: None  Other Topics Concern  . Not on file  Social History Narrative  . Not on file   Social Determinants of Health   Financial Resource Strain: Low Risk   . Difficulty of Paying Living Expenses: Not very hard  Food Insecurity: No Food Insecurity  . Worried About Charity fundraiser in the Last Year: Never true   . Ran Out of Food in the Last Year: Never true  Transportation Needs: No Transportation Needs  . Lack of Transportation (Medical): No  . Lack of Transportation (Non-Medical): No  Physical Activity: Not on file  Stress: Not on file  Social Connections: Not on file  Intimate Partner Violence: Not on file    FH:  Family History  Problem Relation Age of Onset  . Anesthesia problems Neg Hx   . Hypotension Neg Hx   . Malignant hyperthermia Neg Hx   . Pseudochol deficiency Neg Hx     Past Medical History:  Diagnosis Date  . Essential hypertension   . GERD (gastroesophageal reflux disease)   . PSVT (paroxysmal supraventricular tachycardia) ([place])   . Stroke [facility])     Current Outpatient Medications  Medication Sig Dispense Refill  . ASPIRIN LOW DOSE 81 MG EC tablet Take 81 mg by mouth daily.    . carvedilol (COREG) 25 MG tablet Take 25 mg by mouth 2 (two) times daily.    . clopidogrel (PLAVIX) 75 MG tablet Take 75 mg by mouth daily.    . DENTA 5000 PLUS 1.1 % CREA dental cream Take 1 application by mouth at bedtime.    . diclofenac (VOLTAREN) 75 MG EC tablet Take 75 mg by mouth at bedtime.    . hydrALAZINE (APRESOLINE) 25 MG tablet Take 25 mg by mouth 3 (three) times daily.    Marland Kitchen losartan (COZAAR) 50 MG tablet Take 2 tablets (100 mg total) by mouth daily. 30 tablet 0  . Multiple Vitamin (MULTIVITAMIN) tablet Take 1 tablet by mouth daily.    . pantoprazole (PROTONIX) 40 MG tablet Take 1 tablet (40 mg total) by mouth daily. 30 tablet 11  . potassium chloride SA (KLOR-CON) 20 MEQ tablet Take 20 mEq by mouth 2 (two) times daily.    . rosuvastatin (CRESTOR) 20 MG tablet Take 1 tablet (20 mg total) by mouth daily. 30 tablet 0  . spironolactone (ALDACTONE) 25 MG tablet Take 1 tablet (25 mg total) by mouth daily. 30 tablet 0  . torsemide (DEMADEX) 20 MG tablet Take 1 tablet (20 mg total) by mouth daily. 30 tablet 0  . amLODipine (NORVASC) 5 MG tablet Take 1 tablet by mouth daily.     No  current facility-administered medications for this encounter.    Vitals:   05/18/20 0904  BP: (!) 152/100  Pulse: 65  SpO2: 97%  Weight: 125.2 kg (276 lb)    PHYSICAL EXAM: Cardiac: JVD flat, normal rate and rhythm, clear s1 and s2, no murmurs, rubs or gallops, Trace LE edema Pulmonary: CTAB, not in distress Abdominal: non distended abdomen, soft and nontender Psych: Alert,  conversant, in good spirits   ECG; NSR 66 non-specific T wave abnormality Personally reviewed  ASSESSMENT & PLAN: Chronic Systolic CHF: -new diagnosis, does have hx of uncontrolled HTN but also evidence of ischemic heart disease on MRI -ECHO 05/2020 with EF 45%, G1DD, no valvular disease, normal RV -Cardiac MRI 05/2020 with <50% subendocardial LGE of the apical and basal lateral and inferior walls suggestive of prior infarction and viable myocardium -NYHA Class II-III symptoms, mild extra volume on exam today but his weight is going in the right direction and he is monitoring it closely. -PCP already ordered sleep study which I agree with,  -will need weight loss -Arranged for L/RHC to be done in 2 days -Continue spiro 25mg , carvedilol 25mg  BID, asa/plavix, torsemide 20mg  daily -Stop losartan and switch to entresto 49/51, I think this will help get his bp under better control and get that bit of fluid off, I will leave him on amlodipine for now but we can d/c as we titrate up his entresto -will not add sglt2i until after cath but this will also be important for him going forward -place zio monitor today -repeat bmp   Hx of CVA: -Recent diagnosis 04/2020 cortical/subcortical infarcts within posterior right frontal lobe and right parietooccipital lobes -Has the appearance of an embolic stroke and given Cardiac MRI results very suspicious that this came from the LV after an asymptomatic MI the patient had.   -Place Zio to rule out arrhythmia as a cause -Given the current evidence we have I think it is reasonable  to switch him to anticoagulation after cath in lieu of antiplatelet therapy   Follow up 2 weeks post cath in AHF clinic can alter this if needed based on cath findings  Guadlupe Spanish, MD   Patient seen and examined with the above-signed Advanced Practice Provider and/or Housestaff. I personally reviewed laboratory data, imaging studies and relevant notes. I independently examined the patient and formulated the important aspects of the plan. I have edited the note to reflect any of my changes or salient points. I have personally discussed the plan with the patient and/or family.  60 y/o male with recent CVA (suspect cardio-embolic) and subsequently found to have LV dysfunction with EF 45% and apical WMA. 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 Denies anginal symptoms.    General:  Well appearing. No resp difficulty HEENT: normal Neck: supple. JVP 7-8. Carotids 2+ bilat; no bruits. No lymphadenopathy or thryomegaly appreciated. Cor: PMI nondisplaced. Regular rate & rhythm. No rubs, gallops or murmurs. Lungs: clear Abdomen: soft, nontender, nondistended. No hepatosplenomegaly. No bruits or masses. Good bowel sounds. Extremities: no cyanosis, clubbing, rash, 1+ edema Neuro: alert & orientedx3, cranial nerves grossly intact. moves all 4 extremities w/o difficulty. 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 No clear LV thrombus on echo or MRI.   Agree with med changes as above and placing Zio. Discussed need for R/L cath to further evaluate for CAD. Will arrange for later this week.   Post cath would consider at least 3 months of [facility] for embolic CVA with apical infarct.   Glori Bickers, MD  12:56 AM

## 2020-05-18 ENCOUNTER — Other Ambulatory Visit: Payer: Self-pay

## 2020-05-18 ENCOUNTER — Encounter (HOSPITAL_COMMUNITY): Payer: Self-pay

## 2020-05-18 ENCOUNTER — Ambulatory Visit (HOSPITAL_COMMUNITY)
Admission: RE | Admit: 2020-05-18 | Discharge: 2020-05-18 | Disposition: A | Discharge status: Home / Self Care | Payer: BC Managed Care – PPO | Source: Ambulatory Visit | Attending: Internal Medicine | Admitting: Internal Medicine

## 2020-05-18 ENCOUNTER — Other Ambulatory Visit (HOSPITAL_COMMUNITY)
Admission: RE | Admit: 2020-05-18 | Discharge: 2020-05-18 | Disposition: A | Discharge status: Home / Self Care | Payer: BC Managed Care – PPO | Source: Ambulatory Visit | Attending: Internal Medicine | Admitting: Internal Medicine

## 2020-05-18 VITALS — BP 152/100 | HR 65 | Wt 276.0 lb

## 2020-05-18 DIAGNOSIS — Z87891 Personal history of nicotine dependence: Secondary | ICD-10-CM | POA: Insufficient documentation

## 2020-05-18 DIAGNOSIS — I252 Old myocardial infarction: Secondary | ICD-10-CM | POA: Diagnosis not present

## 2020-05-18 DIAGNOSIS — R0789 Other chest pain: Secondary | ICD-10-CM | POA: Diagnosis present

## 2020-05-18 DIAGNOSIS — Z01812 Encounter for preprocedural laboratory examination: Secondary | ICD-10-CM | POA: Insufficient documentation

## 2020-05-18 DIAGNOSIS — Z7901 Long term (current) use of anticoagulants: Secondary | ICD-10-CM | POA: Diagnosis not present

## 2020-05-18 DIAGNOSIS — Z791 Long term (current) use of non-steroidal anti-inflammatories (NSAID): Secondary | ICD-10-CM | POA: Diagnosis not present

## 2020-05-18 DIAGNOSIS — I5022 Chronic systolic (congestive) heart failure: Secondary | ICD-10-CM | POA: Insufficient documentation

## 2020-05-18 DIAGNOSIS — G4734 Idiopathic sleep related nonobstructive alveolar hypoventilation: Secondary | ICD-10-CM

## 2020-05-18 DIAGNOSIS — Z7902 Long term (current) use of antithrombotics/antiplatelets: Secondary | ICD-10-CM | POA: Diagnosis not present

## 2020-05-18 DIAGNOSIS — I5023 Acute on chronic systolic (congestive) heart failure: Secondary | ICD-10-CM | POA: Diagnosis not present

## 2020-05-18 DIAGNOSIS — I4891 Unspecified atrial fibrillation: Secondary | ICD-10-CM | POA: Diagnosis not present

## 2020-05-18 DIAGNOSIS — Z20822 Contact with and (suspected) exposure to covid-19: Secondary | ICD-10-CM | POA: Insufficient documentation

## 2020-05-18 DIAGNOSIS — Z8673 Personal history of transient ischemic attack (TIA), and cerebral infarction without residual deficits: Secondary | ICD-10-CM | POA: Insufficient documentation

## 2020-05-18 DIAGNOSIS — Z79899 Other long term (current) drug therapy: Secondary | ICD-10-CM | POA: Insufficient documentation

## 2020-05-18 DIAGNOSIS — I110 Hypertensive heart disease with heart failure: Secondary | ICD-10-CM | POA: Insufficient documentation

## 2020-05-18 LAB — BASIC METABOLIC PANEL
Anion gap: 8 (ref 5–15)
BUN: 18 mg/dL (ref 8–23)
CO2: 28 mmol/L (ref 22–32)
Calcium: 9.4 mg/dL (ref 8.9–10.3)
Chloride: 103 mmol/L (ref 98–111)
Creatinine, Ser: 1.32 mg/dL — ABNORMAL HIGH (ref 0.61–1.24)
GFR, Estimated: >60 mL/min (ref >60)
Glucose, Bld: 105 mg/dL — ABNORMAL HIGH (ref 70–99)
Potassium: 4.4 mmol/L (ref 3.5–5.1)
Sodium: 139 mmol/L (ref 135–145)

## 2020-05-18 LAB — SARS CORONAVIRUS 2 (TAT 6-24 HRS): SARS Coronavirus 2: NEGATIVE

## 2020-05-18 MED ORDER — ENTRESTO 49-51 MG PO TABS: 1.0000 | ORAL_TABLET | Freq: Two times a day (BID) | ORAL | 6 refills | Status: DC

## 2020-05-18 NOTE — Patient Instructions (Signed)
STOP Losartan STOP Diclofenac START Entresto 49/51 mg, one tab twice a day   Labs today We will only contact you if something comes back abnormal or we need to make some changes. Otherwise no news is good news!  Your physician has recommended that you have a sleep study. This test records several body functions during sleep, including: brain activity, eye movement, oxygen and carbon dioxide blood levels, heart rate and rhythm, breathing rate and rhythm, the flow of air through your mouth and nose, snoring, body muscle movements, and chest and belly movement.  Your provider has recommended that  you wear a Zio Patch for 7 days.  This monitor will record your heart rhythm for our review.  IF you have any symptoms while wearing the monitor please press the button.  If you have any issues with the patch or you notice a red or orange light on it please call the company at [phone].  Once you remove the patch please mail it back to the company as soon as possible so we can get the results.   Your physician recommends that you schedule a follow-up appointment in: 2-3 weeks after procedure           You are scheduled for a Cardiac Catheterization on Friday, April 15 with Dr. Glori Bickers.  1. Please arrive at the [facility] (Main Entrance A) at [facility]: [address] [place], [place] [address] at 10:00 AM (This time is two hours before your procedure to ensure your preparation). Free valet parking service is available.   Special note: Every effort is made to have your procedure done on time. Please understand that emergencies sometimes delay scheduled procedures.  2. Diet: Do not eat solid foods after midnight.  The patient may have clear liquids until 5am upon the day of the procedure.  3. Labs:Pre procedure blood work done 05/18/2020   You will need a pre procedure COVID test    WHEN: 05/18/20  anytime between 9am-3pm WHERE: COVID Test Site [address] [place], [place] [address]  This is a drive thru testing site, you will remain in your car. Be sure to get in the line FOR PROCEDURES Once you have been swabbed you will need to remain home in quarantine until you return for your procedure.   4. Medication instructions in preparation for your procedure:   Contrast Allergy: No   Stop taking, Torsemide (Demadex) Friday, April 15,   On the morning of your procedure, take your Plavix/Clopidogrel and any morning medicines NOT listed above.  You may use sips of water.  5. Plan for one night stay--bring personal belongings. 6. Bring a current list of your medications and current insurance cards. 7. You MUST have a responsible person to drive you home. 8. Someone MUST be with you the first 24 hours after you arrive home or your discharge will be delayed. 9. Please wear clothes that are easy to get on and off and wear slip-on shoes.  Thank you for allowing [place] to care for you!   -- [facility]

## 2020-05-19 ENCOUNTER — Other Ambulatory Visit (HOSPITAL_COMMUNITY): Payer: Self-pay | Admitting: *Deleted

## 2020-05-19 ENCOUNTER — Telehealth (HOSPITAL_COMMUNITY): Payer: Self-pay | Admitting: *Deleted

## 2020-05-19 DIAGNOSIS — I5022 Chronic systolic (congestive) heart failure: Secondary | ICD-10-CM

## 2020-05-19 NOTE — Telephone Encounter (Signed)
Pt left vm stating his zio monitor that was placed yesterday fell off as soon as he left the office. Pt lives in [place]. Pt is scheduled for a cath tomorrow. Left pt VM that we will place another zio after cath.

## 2020-05-19 NOTE — Addendum Note (Signed)
Encounter addended by: Jolaine Artist, MD on: 05/19/2020 12:59 AM  Actions taken: Clinical Note Signed, Level of Service modified, Visit diagnoses modified

## 2020-05-20 ENCOUNTER — Ambulatory Visit (HOSPITAL_COMMUNITY)
Admission: RE | Admit: 2020-05-20 | Discharge: 2020-05-20 | Disposition: A | Discharge status: Home / Self Care | Payer: BC Managed Care – PPO | Attending: Internal Medicine | Admitting: Internal Medicine

## 2020-05-20 ENCOUNTER — Ambulatory Visit (HOSPITAL_COMMUNITY)
Admission: RE | Disposition: A | Discharge status: Home / Self Care | Payer: Self-pay | Source: Home / Self Care | Attending: Internal Medicine

## 2020-05-20 DIAGNOSIS — I255 Ischemic cardiomyopathy: Secondary | ICD-10-CM | POA: Diagnosis not present

## 2020-05-20 DIAGNOSIS — Z7902 Long term (current) use of antithrombotics/antiplatelets: Secondary | ICD-10-CM | POA: Insufficient documentation

## 2020-05-20 DIAGNOSIS — I5023 Acute on chronic systolic (congestive) heart failure: Secondary | ICD-10-CM | POA: Insufficient documentation

## 2020-05-20 DIAGNOSIS — Z79899 Other long term (current) drug therapy: Secondary | ICD-10-CM | POA: Diagnosis not present

## 2020-05-20 DIAGNOSIS — E785 Hyperlipidemia, unspecified: Secondary | ICD-10-CM | POA: Insufficient documentation

## 2020-05-20 DIAGNOSIS — I2510 Atherosclerotic heart disease of native coronary artery without angina pectoris: Secondary | ICD-10-CM | POA: Diagnosis not present

## 2020-05-20 DIAGNOSIS — Z791 Long term (current) use of non-steroidal anti-inflammatories (NSAID): Secondary | ICD-10-CM | POA: Diagnosis not present

## 2020-05-20 DIAGNOSIS — Z95820 Peripheral vascular angioplasty status with implants and grafts: Secondary | ICD-10-CM

## 2020-05-20 DIAGNOSIS — I5022 Chronic systolic (congestive) heart failure: Secondary | ICD-10-CM

## 2020-05-20 DIAGNOSIS — Z7982 Long term (current) use of aspirin: Secondary | ICD-10-CM | POA: Diagnosis not present

## 2020-05-20 DIAGNOSIS — Z955 Presence of coronary angioplasty implant and graft: Secondary | ICD-10-CM | POA: Insufficient documentation

## 2020-05-20 DIAGNOSIS — I110 Hypertensive heart disease with heart failure: Secondary | ICD-10-CM | POA: Insufficient documentation

## 2020-05-20 DIAGNOSIS — Z8673 Personal history of transient ischemic attack (TIA), and cerebral infarction without residual deficits: Secondary | ICD-10-CM | POA: Diagnosis not present

## 2020-05-20 HISTORY — PX: RIGHT/LEFT HEART CATH AND CORONARY ANGIOGRAPHY: CATH118266

## 2020-05-20 HISTORY — PX: CORONARY STENT INTERVENTION: CATH118234

## 2020-05-20 LAB — POCT I-STAT EG7
Acid-Base Excess: 0 mmol/L (ref 0.0–2.0)
Acid-base deficit: 3 mmol/L — ABNORMAL HIGH (ref 0.0–2.0)
Acid-base deficit: 3 mmol/L — ABNORMAL HIGH (ref 0.0–2.0)
Bicarbonate: 21.7 mmol/L (ref 20.0–28.0)
Bicarbonate: 22.8 mmol/L (ref 20.0–28.0)
Bicarbonate: 25.3 mmol/L (ref 20.0–28.0)
Calcium, Ion: 0.9 mmol/L — ABNORMAL LOW (ref 1.15–1.40)
Calcium, Ion: 0.93 mmol/L — ABNORMAL LOW (ref 1.15–1.40)
Calcium, Ion: 1.21 mmol/L (ref 1.15–1.40)
HCT: 39 % (ref 39.0–52.0)
HCT: 41 % (ref 39.0–52.0)
HCT: 45 % (ref 39.0–52.0)
Hemoglobin: 13.3 g/dL (ref 13.0–17.0)
Hemoglobin: 13.9 g/dL (ref 13.0–17.0)
Hemoglobin: 15.3 g/dL (ref 13.0–17.0)
O2 Saturation: 72 %
O2 Saturation: 76 %
O2 Saturation: 77 %
Potassium: 3.2 mmol/L — ABNORMAL LOW (ref 3.5–5.1)
Potassium: 3.4 mmol/L — ABNORMAL LOW (ref 3.5–5.1)
Potassium: 4.1 mmol/L (ref 3.5–5.1)
Sodium: 143 mmol/L (ref 135–145)
Sodium: 145 mmol/L (ref 135–145)
Sodium: 147 mmol/L — ABNORMAL HIGH (ref 135–145)
TCO2: 23 mmol/L (ref 22–32)
TCO2: 24 mmol/L (ref 22–32)
TCO2: 27 mmol/L (ref 22–32)
pCO2, Ven: 37.9 mmHg — ABNORMAL LOW (ref 44.0–60.0)
pCO2, Ven: 41.2 mmHg — ABNORMAL LOW (ref 44.0–60.0)
pCO2, Ven: 42.6 mmHg — ABNORMAL LOW (ref 44.0–60.0)
pH, Ven: 7.351 (ref 7.250–7.430)
pH, Ven: 7.365 (ref 7.250–7.430)
pH, Ven: 7.381 (ref 7.250–7.430)
pO2, Ven: 39 mmHg (ref 32.0–45.0)
pO2, Ven: 42 mmHg (ref 32.0–45.0)
pO2, Ven: 44 mmHg (ref 32.0–45.0)

## 2020-05-20 LAB — POCT I-STAT 7, (LYTES, BLD GAS, ICA,H+H)
Acid-base deficit: 3 mmol/L — ABNORMAL HIGH (ref 0.0–2.0)
Bicarbonate: 20.3 mmol/L (ref 20.0–28.0)
Calcium, Ion: 0.85 mmol/L — CL (ref 1.15–1.40)
HCT: 38 % — ABNORMAL LOW (ref 39.0–52.0)
Hemoglobin: 12.9 g/dL — ABNORMAL LOW (ref 13.0–17.0)
O2 Saturation: 99 %
Potassium: 3.2 mmol/L — ABNORMAL LOW (ref 3.5–5.1)
Sodium: 149 mmol/L — ABNORMAL HIGH (ref 135–145)
TCO2: 21 mmol/L — ABNORMAL LOW (ref 22–32)
pCO2 arterial: 30.4 mmHg — ABNORMAL LOW (ref 32.0–48.0)
pH, Arterial: 7.432 (ref 7.350–7.450)
pO2, Arterial: 112 mmHg — ABNORMAL HIGH (ref 83.0–108.0)

## 2020-05-20 LAB — POCT ACTIVATED CLOTTING TIME: Activated Clotting Time: 363 seconds

## 2020-05-20 SURGERY — RIGHT/LEFT HEART CATH AND CORONARY ANGIOGRAPHY
Anesthesia: LOCAL

## 2020-05-20 MED ORDER — ONDANSETRON HCL 4 MG/2ML IJ SOLN
4.0000 mg | Freq: Four times a day (QID) | INTRAMUSCULAR | Status: DC | PRN
Start: 2020-05-20 — End: 2020-05-21

## 2020-05-20 MED ORDER — HEPARIN (PORCINE) IN NACL 1000-0.9 UT/500ML-% IV SOLN
INTRAVENOUS | Status: DC | PRN
  Administered 2020-05-20 (×2): 500 mL

## 2020-05-20 MED ORDER — HEPARIN SODIUM (PORCINE) 1000 UNIT/ML IJ SOLN
INTRAMUSCULAR | Status: AC
  Filled 2020-05-20: qty 1

## 2020-05-20 MED ORDER — HEPARIN SODIUM (PORCINE) 1000 UNIT/ML IJ SOLN
INTRAMUSCULAR | Status: DC | PRN
  Administered 2020-05-20: 6000 [IU] via INTRAVENOUS

## 2020-05-20 MED ORDER — FENTANYL CITRATE (PF) 100 MCG/2ML IJ SOLN
INTRAMUSCULAR | Status: DC | PRN
  Administered 2020-05-20: 25 ug via INTRAVENOUS

## 2020-05-20 MED ORDER — FENTANYL CITRATE (PF) 100 MCG/2ML IJ SOLN
INTRAMUSCULAR | Status: AC
  Filled 2020-05-20: qty 2

## 2020-05-20 MED ORDER — IOHEXOL 350 MG/ML SOLN
INTRAVENOUS | Status: DC | PRN
  Administered 2020-05-20: 80 mL

## 2020-05-20 MED ORDER — CLOPIDOGREL BISULFATE 300 MG PO TABS
ORAL_TABLET | ORAL | Status: DC | PRN
  Administered 2020-05-20: 300 mg via ORAL

## 2020-05-20 MED ORDER — IOHEXOL 350 MG/ML SOLN
INTRAVENOUS | Status: DC | PRN
  Administered 2020-05-20: 80 mL via INTRA_ARTERIAL

## 2020-05-20 MED ORDER — MIDAZOLAM HCL 2 MG/2ML IJ SOLN
INTRAMUSCULAR | Status: DC | PRN
  Administered 2020-05-20: 1 mg via INTRAVENOUS

## 2020-05-20 MED ORDER — LIDOCAINE HCL (PF) 1 % IJ SOLN
INTRAMUSCULAR | Status: AC
  Filled 2020-05-20: qty 30

## 2020-05-20 MED ORDER — SODIUM CHLORIDE 0.9% FLUSH: 3.0000 mL | Freq: Two times a day (BID) | INTRAVENOUS | Status: DC

## 2020-05-20 MED ORDER — SODIUM CHLORIDE 0.9% FLUSH: 3.0000 mL | INTRAVENOUS | Status: DC | PRN

## 2020-05-20 MED ORDER — HEPARIN SODIUM (PORCINE) 1000 UNIT/ML IJ SOLN
INTRAMUSCULAR | Status: DC | PRN
  Administered 2020-05-20: 8000 [IU] via INTRAVENOUS

## 2020-05-20 MED ORDER — SODIUM CHLORIDE 0.9 % IV SOLN: INTRAVENOUS | Status: DC

## 2020-05-20 MED ORDER — MIDAZOLAM HCL 2 MG/2ML IJ SOLN
INTRAMUSCULAR | Status: AC
  Filled 2020-05-20: qty 2

## 2020-05-20 MED ORDER — VERAPAMIL HCL 2.5 MG/ML IV SOLN
INTRAVENOUS | Status: AC
  Filled 2020-05-20: qty 2

## 2020-05-20 MED ORDER — HYDRALAZINE HCL 20 MG/ML IJ SOLN: 10.0000 mg | INTRAMUSCULAR | Status: DC | PRN

## 2020-05-20 MED ORDER — LABETALOL HCL 5 MG/ML IV SOLN: 10.0000 mg | INTRAVENOUS | Status: DC | PRN

## 2020-05-20 MED ORDER — CLOPIDOGREL BISULFATE 300 MG PO TABS
ORAL_TABLET | ORAL | Status: AC
  Filled 2020-05-20: qty 1

## 2020-05-20 MED ORDER — HEPARIN (PORCINE) IN NACL 1000-0.9 UT/500ML-% IV SOLN
INTRAVENOUS | Status: AC
  Filled 2020-05-20: qty 1000

## 2020-05-20 MED ORDER — ASPIRIN 81 MG PO CHEW
81.0000 mg | CHEWABLE_TABLET | ORAL | Status: DC
Start: 2020-05-21 — End: 2020-05-21

## 2020-05-20 MED ORDER — SODIUM CHLORIDE 0.9 % IV SOLN: 250.0000 mL | INTRAVENOUS | Status: DC | PRN

## 2020-05-20 MED ORDER — ACETAMINOPHEN 325 MG PO TABS: 650.0000 mg | ORAL_TABLET | ORAL | Status: DC | PRN

## 2020-05-20 MED ORDER — LIDOCAINE HCL (PF) 1 % IJ SOLN
INTRAMUSCULAR | Status: DC | PRN
  Administered 2020-05-20 (×2): 2 mL via INTRADERMAL

## 2020-05-20 MED ORDER — MIDAZOLAM HCL 2 MG/2ML IJ SOLN
INTRAMUSCULAR | Status: DC | PRN
  Administered 2020-05-20: 2 mg via INTRAVENOUS

## 2020-05-20 MED ORDER — CLOPIDOGREL BISULFATE 75 MG PO TABS: 75.0000 mg | ORAL_TABLET | Freq: Every day | ORAL | Status: DC

## 2020-05-20 SURGICAL SUPPLY — 18 items
BALLN SAPPHIRE 2.0X20 (BALLOONS) ×2
BALLN SAPPHIRE ~~LOC~~ 3.0X18 (BALLOONS) ×2 IMPLANT
BALLOON SAPPHIRE 2.0X20 (BALLOONS) ×1 IMPLANT
CATH 5FR JL3.5 JR4 ANG PIG MP (CATHETERS) ×2 IMPLANT
CATH SWAN GANZ 7F STRAIGHT (CATHETERS) ×2 IMPLANT
CATH VISTA GUIDE 6FR XBLAD3.5 (CATHETERS) ×2 IMPLANT
DEVICE RAD COMP TR BAND LRG (VASCULAR PRODUCTS) ×2 IMPLANT
GLIDESHEATH SLEND SS 6F .021 (SHEATH) ×2 IMPLANT
GLIDESHEATH SLENDER 7FR .021G (SHEATH) ×2 IMPLANT
GUIDEWIRE INQWIRE 1.5J.035X260 (WIRE) ×1 IMPLANT
INQWIRE 1.5J .035X260CM (WIRE) ×2
KIT ENCORE 26 ADVANTAGE (KITS) ×2 IMPLANT
KIT ESSENTIALS PG (KITS) ×2 IMPLANT
PACK CARDIAC CATHETERIZATION (CUSTOM PROCEDURE TRAY) ×2 IMPLANT
STENT SYNERGY XD 2.75X28 (Permanent Stent) ×1 IMPLANT
SYNERGY XD 2.75X28 (Permanent Stent) ×2 IMPLANT
TRANSDUCER W/STOPCOCK (MISCELLANEOUS) ×2 IMPLANT
WIRE COUGAR XT STRL 190CM (WIRE) ×2 IMPLANT

## 2020-05-20 NOTE — Progress Notes (Signed)
[id number] Discussed with pt the importance of plavix with stent. Reviewed NTG use, heart healthy and low sodium diets, walking for ex, daily weights. Gave pt CHF booklet and discussed daily weights and when to call MD, 2000 mg sodium restriction, 2L FR, and signs/symptoms of CHF. Discussed CRP 2 and referral placed to [place]. Pt doubtful he will be able to attend. Graylon Good RN BSN 05/20/2020 3:29 PM

## 2020-05-20 NOTE — Interval H&P Note (Signed)
History and Physical Interval Note:  05/20/2020 12:02 PM  Dean Wilson  has presented today for surgery, with the diagnosis of heart failure - cad.  The various methods of treatment have been discussed with the patient and family. After consideration of risks, benefits and other options for treatment, the patient has consented to  Procedure(s): RIGHT/LEFT HEART CATH AND CORONARY ANGIOGRAPHY (N/A) and possible coronary angioplasty as a surgical intervention.  The patient's history has been reviewed, patient examined, no change in status, stable for surgery.  I have reviewed the patient's chart and labs.  Questions were answered to the patient's satisfaction.     [name]

## 2020-05-20 NOTE — Discharge Summary (Signed)
Discharge Summary for Same Day PCI   Patient ID: Dean Wilson MRN: [id number]; DOB: 08-29-1960  Admit date: 05/20/2020 Discharge date: 05/20/2020  Primary Care Provider: Glenda Chroman, MD  Primary Cardiologist: No primary care provider on file.  Discharge Diagnoses    Active problems: CAD Chronic systolic heart failure Ischemic cardiomyopathy Hypertension Recent CVA Hyperlipidemia   Diagnostic Studies/Procedures    Cardiac Catheterization 05/20/2020:  RIGHT/LEFT HEART CATH AND CORONARY ANGIOGRAPHY    Conclusion    Mid RCA lesion is 40% stenosed.  Dist RCA lesion is 75% stenosed with 75% stenosed side branch in RPDA.  2nd Mrg lesion is 100% stenosed.  Ost Cx to Prox Cx lesion is 30% stenosed.  Dist LAD lesion is 95% stenosed.  Ost LAD to Prox LAD lesion is 50% stenosed.  Mid LAD lesion is 50% stenosed.  1st Diag lesion is 95% stenosed.   Findings:  Ao = 149/78 (104) LV =  139/10 RA =  3 RV = 19/3 PA = 21/9 (14) PCW = 4 Fick cardiac output/index = 6.2/2.5 PVR = 1.5 WU Ao sat = 99% PA sat = 72%, 76%  Assessment: 1. 3v CAD 2. EF 40-45% 3. Well compensated hemodynamics  Plan/Discussion:  Plan PCI of LAD. Distal RCA borderline significant but does not feed much myocardium. Will treat that lesion medically for now unless he develops angina.   Glori Bickers, MD  1:24 PM  CORONARY STENT INTERVENTION    Conclusion    Prox LAD lesion is 50% stenosed.  Mid LAD lesion is 50% stenosed.  Dist LAD lesion is 95% stenosed.  A drug-eluting stent was successfully placed using a SYNERGY XD 2.75X28.  Post intervention, there is a 0% residual stenosis.   1. Severe stenosis mid LAD 2. Successful PTCA/DES x 1 mid LAD 3. See Dr. Clayborne Dana report from the diagnostic cath for further details of the other vessels  Recommendations: DAPT with ASA and Plavix for at least six months. Same day post PCI discharge.     Diagnostic Dominance: Right    Intervention     History of Present Illness     Dean Wilson is a 60 y.o. male with past medical history of newly diagnosed systolic heart failure, poorly controlled hypertension and recent CVA who presented for outpatient cardiac catheterization.  Admitted at outside hospital  04/2020 with acute CVA symptoms found to have multiple small acute cortical/subcortical infarcts within posterior right frontal lobe and right parietooccipital lobes. Also had evidence of small chronic right cerebellar infarct.  During his workup he had an echo with EF 45%, normal RV, normal bubble study, G1DD, normal valves, collapsible IVC.  Carotid duplex without significant stenosis.  Then admitted to [facility] for acute systolic heart failure. Started on IV diuresis with good response,  Had a repeat ECHO which demonstrated EF 40-45% and possible LV thrombus with follow up cardiac MRI recommended.  Follow up MRI suggested ischemic heart disease with <50% LGE of the apical and basal lateral and inferior walls.   Seen by Dr. Haroldine Laws March 13 to establish cardiac care.  Losartan changed to Entresto.  Placed on Zio patch.  His stroke felt could be embolic in nature.  Cardiac catheterization was arranged for further evaluation.  Hospital Course     The patient underwent cardiac cath as noted above with severe stenosis in the mid LAD s/p PTCA/DES.  Residual disease as summarized above.  Plan for DAPT with ASA/Plavix (was on Plavix already due to recent  stroke) for at least 6 months. The patient was seen by cardiac rehab while in short stay. There were no observed complications post cath. Radial cath site was re-evaluated prior to discharge and found to be stable without any complications. Instructions/precautions regarding cath site care were given prior to discharge.  Pt remains on plavix that he was on prior to procedure.   Dean Wilson was seen by Dr. Buena Irish  and determined stable for discharge home. Follow up with our office has been arranged. Medications are listed below. Pertinent changes include none.      Cath/PCI Registry Performance & Quality Measures: 1. Aspirin prescribed? - Yes 2. ADP Receptor Inhibitor (Plavix/Clopidogrel, Brilinta/Ticagrelor or Effient/Prasugrel) prescribed (includes medically managed patients)? - Yes 3. High Intensity Statin (Lipitor 40-80mg  or Crestor 20-40mg ) prescribed? - Yes 4. For EF <40%, was ACEI/ARB prescribed? - Yes 5. For EF <40%, Aldosterone Antagonist (Spironolactone or Eplerenone) prescribed? - Yes 6. Cardiac Rehab Phase II ordered (Included Medically managed Patients)? - Yes  Discharge Vitals Blood pressure (!) 153/88, pulse 61, temperature (!) 97.5 F (36.4 C), temperature source Oral, resp. rate 14, height 6' (1.829 m), weight 124.7 kg, SpO2 96 %.  Filed Weights   05/20/20 0955  Weight: 124.7 kg    Last Labs & Radiologic Studies    Basic Metabolic Panel Recent Labs    05/18/20 1046  NA 139  K 4.4  CL 103  CO2 28  GLUCOSE 105*  BUN 18  CREATININE 1.32*  CALCIUM 9.4   High Sensitivity Troponin:   Recent Labs  Lab 05/04/20 1210 05/04/20 1427  TROPONINIHS 24* 21*   _____________  DG Chest 2 View  Result Date: 05/04/2020 CLINICAL DATA:  Shortness of breath EXAM: CHEST - 2 VIEW COMPARISON:  12/15/2019 FINDINGS: The heart size and mediastinal contours are within normal limits. Both lungs are clear. The visualized skeletal structures are unremarkable. IMPRESSION: No active cardiopulmonary disease. Electronically Signed   By: Kathreen Devoid   On: 05/04/2020 12:25   CARDIAC CATHETERIZATION  Result Date: 05/20/2020  Prox LAD lesion is 50% stenosed.  Mid LAD lesion is 50% stenosed.  Dist LAD lesion is 95% stenosed.  A drug-eluting stent was successfully placed using a SYNERGY XD 2.75X28.  Post intervention, there is a 0% residual stenosis.  1. Severe stenosis mid LAD 2. Successful  PTCA/DES x 1 mid LAD 3. See Dr. Clayborne Dana report from the diagnostic cath for further details of the other vessels Recommendations: DAPT with ASA and Plavix for at least six months. Same day post PCI discharge.   CARDIAC CATHETERIZATION  Result Date: 05/20/2020  Mid RCA lesion is 40% stenosed.  Dist RCA lesion is 75% stenosed with 75% stenosed side branch in RPDA.  2nd Mrg lesion is 100% stenosed.  Ost Cx to Prox Cx lesion is 30% stenosed.  Dist LAD lesion is 95% stenosed.  Ost LAD to Prox LAD lesion is 50% stenosed.  Mid LAD lesion is 50% stenosed.  1st Diag lesion is 95% stenosed.  Findings: Ao = 149/78 (104) LV =  139/10 RA =  3 RV = 19/3 PA = 21/9 (14) PCW = 4 Fick cardiac output/index = 6.2/2.5 PVR = 1.5 WU Ao sat = 99% PA sat = 72%, 76% Assessment: 1. 3v CAD 2. EF 40-45% 3. Well compensated hemodynamics Plan/Discussion: Plan PCI of LAD. Distal RCA borderline significant but does not feed much myocardium. Will treat that lesion medically for now unless he develops angina. Glori Bickers, MD 1:24 PM  MR CARDIAC MORPHOLOGY W WO CONTRAST  Result Date: 05/06/2020 CLINICAL DATA:  34M with new heart failure. Possible apical thrombus on echo EXAM: CARDIAC MRI TECHNIQUE: The patient was scanned on a 1.5 Tesla Siemens magnet. A dedicated cardiac coil was used. Functional imaging was done using Fiesta sequences. 2,3, and 4 chamber views were done to assess for RWMA's. Modified Simpson's rule using a short axis stack was used to calculate an ejection fraction on a dedicated work Conservation officer, nature. The patient received 15 cc of Gadavist. After 10 minutes inversion recovery sequences were used to assess for infiltration and scar tissue. CONTRAST:  15 cc  of Gadavist FINDINGS: Left ventricle: -Moderate asymmetric hypertrophy measuring up to 61mm in basal septum (30mm in posterior wall), not meeting criteria for HCM (<23mm) -Normal size -Moderate systolic dysfunction -Normal ECV (24%)  -Subendocardial LGE in basal to apical lateral wall and mid inferior wall. LGE <50% transmural -RV insertion site LGE LV EF: 32% (Normal 56-78%) Absolute volumes: LV EDV: 117mL (Normal 77-195 mL) LV ESV: 189mL (Normal 19-72 mL) LV SV: 85mL (Normal 51-133 mL) CO: 3.5L/min (Normal 2.8-8.8 L/min) Indexed volumes: LV EDV: 13mL/sq-m (Normal 47-92 mL/sq-m) LV ESV: 74mL/sq-m (Normal 13-30 mL/sq-m) LV SV: 64mL/sq-m (Normal 32-62 mL/sq-m) CI: 1.4L/min/sq-m (Normal 1.7-4.2 L/min/sq-m) Right ventricle: Small size with normal systolic function RV EF:  59% (Normal 47-74%) Absolute volumes: RV EDV: 76mL (Normal 88-227 mL) RV ESV: 32mL (Normal 23-103 mL) RV SV: 78mL (Normal 52-138 mL) CO: 3.4L/min (Normal 2.8-8.8 L/min) Indexed volumes: RV EDV: 71mL/sq-m (Normal 55-105 mL/sq-m) RV ESV: 76mL/sq-m (Normal 15-43 mL/sq-m) RV SV: 34mL/sq-m (Normal 32-64 mL/sq-m) CI: 1.4L/min/sq-m (Normal 1.7-4.2 L/min/sq-m) Left atrium: Mild enlargement Right atrium: Normal size Mitral valve: Trivial regurgitation Aortic valve: No regurgitation Tricuspid valve: No regurgitation Pulmonic valve: No regurgitation Aorta: Normal proximal ascending aorta Pericardium: Normal IMPRESSION: 1. Normal LV size, moderate hypertrophy, and moderate systolic dysfunction (EF 00%) 2. Subendocardial late gadolinium enhancement consistent with prior myocardial infarction in the basal to apical lateral wall and mid inferior wall. LGE is <50% transmural in these regions suggesting myocardium is viable. 3. RV insertion site LGE, which is a nonspecific finding often seen in setting of elevated pulmonary pressures 4.  Small RV size with normal systolic function (EF 37%) 5.  No apical thrombus seen Electronically Signed   By: Oswaldo Milian MD   On: 05/06/2020 23:54   ECHOCARDIOGRAM COMPLETE  Result Date: 05/05/2020    ECHOCARDIOGRAM REPORT   Patient Name:   Dean Wilson Date of Exam: 05/05/2020 Medical Rec #:  [id number]           Height:       73.0 in Accession  #:    [id number]          Weight:       278.4 lb Date of Birth:  Nov 14, 1960           BSA:          2.477 m Patient Age:    66 years            BP:           165/87 mmHg Patient Gender: M                   HR:           58 bpm. Exam Location:  Inpatient Procedure: 2D Echo, Cardiac Doppler and Color Doppler  REPORT CONTAINS CRITICAL RESULT                   Reported to: Dr. Dareen Piano Reduced EF and abnormal apical findings reported through Epic. Indications:    CHF  History:        Patient has prior history of Echocardiogram examinations, most                 recent 05/17/2014. Stroke, Arrythmias:PSVT; Risk                 Factors:Hypertension.  Sonographer:    Luisa Hart RDCS Referring Phys: [id number] Courtney Paris  Sonographer Comments: Suboptimal parasternal window. IMPRESSIONS  1. Apical images concerning for LV thrombus. GIven drop in EF and recent CVA, echo contrast performed. On echo images, there is no definite thrombus. There appears to be trabeculation/musculature at the apex, but there is also slow moving contrast at the true apex. Given drop in EF and abnormal LV features, would consider cardiac MRI.Marland Kitchen Left ventricular ejection fraction, by estimation, is 40 to 45%. The left ventricle has mildly decreased function. The left ventricle demonstrates global hypokinesis. The left ventricular internal cavity size was mildly dilated. There is moderate concentric left ventricular hypertrophy. Left ventricular diastolic parameters are indeterminate. Elevated left ventricular end-diastolic pressure.  2. Right ventricular systolic function is normal. The right ventricular size is normal.  3. A small pericardial effusion is present.  4. The mitral valve is grossly normal. Mild mitral valve regurgitation.  5. The aortic valve is grossly normal. Aortic valve regurgitation is not visualized.  6. The inferior vena cava is normal in size with greater than 50% respiratory variability, suggesting right  atrial pressure of 3 mmHg. Comparison(s): Prior images unable to be directly viewed, comparison made by report only. FINDINGS  Left Ventricle: Apical images concerning for LV thrombus. GIven drop in EF and recent CVA, echo contrast performed. On echo images, there is no definite thrombus. There appears to be trabeculation/musculature at the apex, but there is also slow moving contrast at the true apex. Given drop in EF and abnormal LV features, would consider cardiac MRI. Left ventricular ejection fraction, by estimation, is 40 to 45%. The left ventricle has mildly decreased function. The left ventricle demonstrates global hypokinesis. The left ventricular internal cavity size was mildly dilated. There is moderate concentric left ventricular hypertrophy. Left ventricular diastolic parameters are indeterminate. Elevated left ventricular end-diastolic pressure. Right Ventricle: The right ventricular size is normal. No increase in right ventricular wall thickness. Right ventricular systolic function is normal. Left Atrium: Left atrial size was normal in size. Right Atrium: Right atrial size was normal in size. Pericardium: A small pericardial effusion is present. Presence of pericardial fat pad. Mitral Valve: The mitral valve is grossly normal. There is mild calcification of the mitral valve leaflet(s). Mild to moderate mitral annular calcification. Mild mitral valve regurgitation. MV peak gradient, 5.4 mmHg. The mean mitral valve gradient is 2.0 mmHg. Tricuspid Valve: The tricuspid valve is grossly normal. Tricuspid valve regurgitation is trivial. Aortic Valve: The aortic valve is grossly normal. Aortic valve regurgitation is not visualized. Aortic valve mean gradient measures 5.0 mmHg. Aortic valve peak gradient measures 8.0 mmHg. Aortic valve area, by VTI measures 5.76 cm. Pulmonic Valve: The pulmonic valve was not well visualized. Pulmonic valve regurgitation is trivial. Aorta: The aortic root is normal in size  and structure. Venous: The inferior vena cava is normal in size with greater than 50% respiratory variability, suggesting right atrial  pressure of 3 mmHg. IAS/Shunts: The interatrial septum was not well visualized.  LEFT VENTRICLE PLAX 2D LVIDd:         5.40 cm      Diastology LVIDs:         4.00 cm      LV e' medial:    3.83 cm/s LV PW:         1.60 cm      LV E/e' medial:  25.2 LV IVS:        1.60 cm      LV e' lateral:   5.12 cm/s LVOT diam:     2.90 cm      LV E/e' lateral: 18.8 LV SV:         184 LV SV Index:   74 LVOT Area:     6.61 cm  LV Volumes (MOD) LV vol d, MOD A2C: 106.0 ml LV vol d, MOD A4C: 147.0 ml LV vol s, MOD A2C: 85.0 ml LV vol s, MOD A4C: 85.6 ml LV SV MOD A2C:     21.0 ml LV SV MOD A4C:     147.0 ml LV SV MOD BP:      43.4 ml RIGHT VENTRICLE RV S prime:     14.30 cm/s  PULMONARY VEINS TAPSE (M-mode): 2.7 cm      A Reversal Duration: 100.00 msec                             A Reversal Velocity: 28.20 cm/s                             Diastolic Velocity:  67.61 cm/s                             S/D Velocity:        0.30                             Systolic Velocity:   95.09 cm/s LEFT ATRIUM             Index       RIGHT ATRIUM           Index LA diam:        4.00 cm 1.62 cm/m  RA Area:     13.40 cm LA Vol (A2C):   71.5 ml 28.87 ml/m RA Volume:   31.90 ml  12.88 ml/m LA Vol (A4C):   32.4 ml 13.08 ml/m LA Biplane Vol: 49.1 ml 19.83 ml/m  AORTIC VALVE                    PULMONIC VALVE AV Area (Vmax):    5.81 cm     PV Vmax:       0.84 m/s AV Area (Vmean):   5.21 cm     PV Vmean:      58.200 cm/s AV Area (VTI):     5.76 cm     PV VTI:        0.201 m AV Vmax:           141.00 cm/s  PV Peak grad:  2.9 mmHg AV Vmean:          106.000 cm/s PV Mean grad:  2.0 mmHg AV VTI:  0.319 m AV Peak Grad:      8.0 mmHg AV Mean Grad:      5.0 mmHg LVOT Vmax:         124.00 cm/s LVOT Vmean:        83.600 cm/s LVOT VTI:          0.278 m LVOT/AV VTI ratio: 0.87  AORTA Ao Asc diam: 2.60 cm MITRAL VALVE  MV Area (PHT): 2.55 cm      SHUNTS MV Area VTI:   4.47 cm      Systemic VTI:  0.28 m MV Peak grad:  5.4 mmHg      Systemic Diam: 2.90 cm MV Mean grad:  2.0 mmHg MV Vmax:       1.16 m/s MV Vmean:      72.5 cm/s MV Decel Time: 298 msec MR Peak grad:    120.6 mmHg MR Mean grad:    81.0 mmHg MR Vmax:         549.00 cm/s MR Vmean:        423.0 cm/s MR PISA:         0.57 cm MR PISA Eff ROA: 2 mm MR PISA Radius:  0.30 cm MV E velocity: 96.50 cm/s MV A velocity: 94.10 cm/s MV E/A ratio:  1.03 Buford Dresser MD Electronically signed by Buford Dresser MD Signature Date/Time: 05/05/2020/12:43:18 PM    Final     Disposition   Pt is being discharged home today in good condition.  Follow-up Plans & Appointments     Follow-up Information    Verta Ellen., NP. Go on 06/03/2020.   Specialty: Cardiology Why: @8 :20am for cath follow up  Contact information: [place] [id number] [place] Follow up.   Specialty: Cardiology Why: Please make appointment for heart failure follow up with Dr. Casper Harrison information: [address] [id number] [place] [phone]             Discharge Instructions    Amb Referral to Cardiac Rehabilitation   Complete by: As directed    Diagnosis: Coronary Stents   After initial evaluation and assessments completed: Virtual Based Care may be provided alone or in conjunction with Phase 2 Cardiac Rehab based on patient barriers.: Yes       Discharge Medications   Allergies as of 05/20/2020      Reactions   Banana Itching   Sunflower Oil Itching   Seeds not oil   Tomato Itching      Medication List    TAKE these medications   amLODipine 5 MG tablet Commonly known as: NORVASC Take 1 tablet by mouth daily.   Aspirin Low Dose 81 MG EC tablet Generic drug: aspirin Take 81 mg by mouth daily.    carvedilol 25 MG tablet Commonly known as: COREG Take 25 mg by mouth 2 (two) times daily.   clopidogrel 75 MG tablet Commonly known as: PLAVIX Take 75 mg by mouth daily.   Denta 5000 Plus 1.1 % Crea dental cream Generic drug: sodium fluoride Take 1 application by mouth at bedtime.   Entresto 49-51 MG Generic drug: sacubitril-valsartan Take 1 tablet by mouth 2 (two) times daily.   hydrALAZINE 25 MG tablet Commonly known as: APRESOLINE Take 25 mg by mouth 3 (three) times daily.   multivitamin tablet Take 1 tablet by mouth daily.   pantoprazole  40 MG tablet Commonly known as: Protonix Take 1 tablet (40 mg total) by mouth daily.   potassium chloride SA 20 MEQ tablet Commonly known as: KLOR-CON Take 20 mEq by mouth 2 (two) times daily.   rosuvastatin 20 MG tablet Commonly known as: CRESTOR Take 1 tablet (20 mg total) by mouth daily.   spironolactone 25 MG tablet Commonly known as: ALDACTONE Take 1 tablet (25 mg total) by mouth daily.   torsemide 20 MG tablet Commonly known as: Demadex Take 1 tablet (20 mg total) by mouth daily.       Allergies Allergies  Allergen Reactions  . Banana Itching  . Sunflower Oil Itching    Seeds not oil  . Tomato Itching    Outstanding Labs/Studies   None  Duration of Discharge Encounter   Greater than 30 minutes including physician time.  Jarrett Soho, PA 05/20/2020, 3:56 PM

## 2020-05-20 NOTE — Discharge Instructions (Signed)
Radial Site Care  This sheet gives you information about how to care for yourself after your procedure. Your health care provider may also give you more specific instructions. If you have problems or questions, contact your health care provider. What can I expect after the procedure? After the procedure, it is common to have:  Bruising and tenderness at the catheter insertion area. Follow these instructions at home: Medicines  Take over-the-counter and prescription medicines only as told by your health care provider. Insertion site care  Follow instructions from your health care provider about how to take care of your insertion site. Make sure you: ? Wash your hands with soap and water before you change your bandage (dressing). If soap and water are not available, use hand sanitizer. ? Change your dressing as told by your health care provider. ? Leave stitches (sutures), skin glue, or adhesive strips in place. These skin closures may need to stay in place for 2 weeks or longer. If adhesive strip edges start to loosen and curl up, you may trim the loose edges. Do not remove adhesive strips completely unless your health care provider tells you to do that.  Check your insertion site every day for signs of infection. Check for: ? Redness, swelling, or pain. ? Fluid or blood. ? Pus or a bad smell. ? Warmth.  Do not take baths, swim, or use a hot tub until your health care provider approves.  You may shower 24-48 hours after the procedure, or as directed by your health care provider. ? Remove the dressing and gently wash the site with plain soap and water. ? Pat the area dry with a clean towel. ? Do not rub the site. That could cause bleeding.  Do not apply powder or lotion to the site. Activity  For 24 hours after the procedure, or as directed by your health care provider: ? Do not flex or bend the affected arm. ? Do not push or pull heavy objects with the affected arm. ? Do not drive  yourself home from the hospital or clinic. You may drive 24 hours after the procedure unless your health care provider tells you not to. ? Do not operate machinery or power tools.  Do not lift anything that is heavier than 10 lb (4.5 kg), or the limit that you are told, until your health care provider says that it is safe.  Ask your health care provider when it is okay to: ? Return to work or school. ? Resume usual physical activities or sports. ? Resume sexual activity.   General instructions  If the catheter site starts to bleed, raise your arm and put firm pressure on the site. If the bleeding does not stop, get help right away. This is a medical emergency.  If you went home on the same day as your procedure, a responsible adult should be with you for the first 24 hours after you arrive home.  Keep all follow-up visits as told by your health care provider. This is important. Contact a health care provider if:  You have a fever.  You have redness, swelling, or yellow drainage around your insertion site. Get help right away if:  You have unusual pain at the radial site.  The catheter insertion area swells very fast.  The insertion area is bleeding, and the bleeding does not stop when you hold steady pressure on the area.  Your arm or hand becomes pale, cool, tingly, or numb. These symptoms may represent a serious   problem that is an emergency. Do not wait to see if the symptoms will go away. Get medical help right away. Call your local emergency services (911 in the [place] Do not drive yourself to the hospital. Summary  After the procedure, it is common to have bruising and tenderness at the site.  Follow instructions from your health care provider about how to take care of your radial site wound. Check the wound every day for signs of infection.  Do not lift anything that is heavier than 10 lb (4.5 kg), or the limit that you are told, until your health care provider says that it  is safe. This information is not intended to replace advice given to you by your health care provider. Make sure you discuss any questions you have with your health care provider. Document Revised: 02/27/2017 Document Reviewed: 02/27/2017 Elsevier Patient Education  2021 [place]. Coronary Angiogram With Stent, Care After This sheet gives you information about how to care for yourself after your procedure. Your health care provider may also give you more specific instructions. If you have problems or questions, contact your health care provider. What can I expect after the procedure? After the procedure, it is common to have:  Bruising and tenderness at the insertion site. This usually fades within 1-2 weeks.  A collection of blood under the skin (hematoma). This usually decreases within 1-2 weeks. Follow these instructions at home: Medicines  Take over-the-counter and prescription medicines only as told by your health care provider.  If you were prescribed an antibiotic medicine, take it as told by your health care provider. Do not stop using the antibiotic even if you start to feel better.  If you take medicines for diabetes, your health care provider may need to change how much you take. Ask your health care provider for specific directions about taking your diabetes medicines.  If you are taking blood thinners: ? Talk with your health care provider before you take any medicines that contain aspirin or NSAIDs, such as ibuprofen. These medicines increase your risk for dangerous bleeding. ? Take your medicine exactly as told, at the same time every day. ? Avoid activities that could cause injury or bruising, and follow instructions about how to prevent falls. ? Wear a medical alert bracelet or carry a card that lists what medicines you take. Eating and drinking  Follow instructions from your health care provider about eating or drinking restrictions.  Eat a heart-healthy diet that  includes plenty of fresh fruits and vegetables.  Avoid foods that are high in salt, sugar, or saturated fat. Avoid fried foods or canned or highly processed food.  Drink enough fluid to keep your urine pale yellow.   Alcohol use  Do not drink alcohol if: ? Your health care provider tells you not to. ? You are pregnant, may be pregnant, or plan to become pregnant.  If you drink alcohol: ? Limit how much you use to:  0-1 drink a day for women.  0-2 drinks a day for men. ? Be aware of how much alcohol is in your drink. In the [place] one drink equals one 12 oz bottle of beer (355 mL), one 5 oz glass of wine (148 mL), or one 1 oz glass of hard liquor (44 mL). Bathing  Do not take baths, swim, or use a hot tub until your health care provider approves. Ask your health care provider if you may take showers. You may only be allowed to take sponge baths.  Gently wash the insertion site with plain soap and water.  Pat the area dry with a clean towel. Do not rub. This may cause bleeding. Incision care  Follow instructions from your health care provider about how to take care of your insertion area. Make sure you: ? Wash your hands with soap and water before and after you change your bandage (dressing). If soap and water are not available, use hand sanitizer. ? Change your dressing as told by your health care provider. ? Leave stitches (sutures) or adhesive strips in place. These skin closures may need to stay in place for 2 weeks or longer. If adhesive strip edges start to loosen and curl up, you may trim the loose edges. Do not remove adhesive strips completely unless your health care provider tells you to do that.  Do not apply powder or lotion on the insertion area.  Check your insertion area every day for signs of infection. Check for: ? Redness, swelling, or pain. ? Fluid or blood. ? Warmth. ? Pus or a bad smell. Activity  Do not drive for 24 hours if you were given a sedative  during your procedure.  Rest as told by your health care provider. ? Avoid sitting for a long time without moving. Get up to take short walks every 1-2 hours. This is important to improve blood flow and breathing. Ask for help if you feel weak or unsteady.  Do not lift anything that is heavier than 10 lb (4.5 kg), or the limit that you are told, until your health care provider says that it is safe.  Return to your normal activities as told by your health care provider. Ask your health care provider what activities are safe for you. Lifestyle  Do not use any products that contain nicotine or tobacco, such as cigarettes, e-cigarettes, and chewing tobacco. If you need help quitting, ask your health care provider.  If needed, work with your health care provider to treat other problems, such as being overweight, or having high blood pressure or diabetes.  Get regular exercise. Do exercises as told by your health care provider.   General instructions  Tell all your health care providers that you have a stent. This is especially important if you are going to get imaging studies, such as MRI.  Wear compression stockings as told by your health care provider. These stockings help to prevent blood clots and reduce swelling in your legs.  Do not strain during a bowel movement if the procedure was done through your leg. Straining may cause bleeding from the insertion site.  Keep all follow-up visits as directed by your health care provider. This is important. Contact a health care provider if you:  Have a fever.  Have chills.  Have redness, swelling, or pain around your insertion area.  Have fluid or blood (other than a little blood on the dressing) coming from your insertion area.  Notice that your insertion area feels warm to the touch.  Have pus or a bad smell coming from your insertion area.  Have more bleeding from the insertion area. Hold pressure on the area. Get help right away  if:  You develop chest pain or shortness of breath.  You feel like fainting or you faint.  Your leg or arm becomes cool, numb, or tingly.  You have unusual pain.  Your insertion area is bleeding, and bleeding continues after 30 minutes of steadily held pressure.  You develop bleeding anywhere else, including from your rectum.  There may be bright red blood in your urine or stool, or you may have black, tarry stool. These symptoms may represent a serious problem that is an emergency. Do not wait to see if the symptoms will go away. Get medical help right away. Call your local emergency services (911 in the [place] Do not drive yourself to the hospital. Summary  After this procedure, it is common to have bruising and tenderness around the catheter insertion site. This will go away in a few weeks.  Follow your health care provider's instructions about caring for your insertion site. Change dressing and clean the area as instructed.  Eat a heart-healthy diet. Limit alcohol use. Do not use tobacco or nicotine.  Contact a health care provider if you have fever or chills, or if you have pus or a bad smell coming from the site.  Get help right away if you develop chest pain, you faint, or have bleeding at the insertion site. This information is not intended to replace advice given to you by your health care provider. Make sure you discuss any questions you have with your health care provider. Document Revised: 08/13/2018 Document Reviewed: 08/13/2018 Elsevier Patient Education  [place].

## 2020-05-20 NOTE — Research (Signed)
[place] Informed Consent   Subject Name: Dean Wilson  Subject met inclusion and exclusion criteria.  The informed consent form, study requirements and expectations were reviewed with the subject and questions and concerns were addressed prior to the signing of the consent form.  The subject verbalized understanding of the trial requirements.  The subject agreed to participate in the [facility] trial and signed the informed consent at 1025 on 05/20/20.  The informed consent was obtained prior to performance of any protocol-specific procedures for the subject.  A copy of the signed informed consent was given to the subject and a copy was placed in the subject's medical record.   [name]

## 2020-05-21 ENCOUNTER — Encounter (HOSPITAL_COMMUNITY): Payer: Self-pay | Admitting: Internal Medicine

## 2020-05-23 ENCOUNTER — Encounter (HOSPITAL_COMMUNITY): Payer: Self-pay | Admitting: Internal Medicine

## 2020-05-23 MED FILL — Verapamil HCl IV Soln 2.5 MG/ML: INTRAVENOUS | Qty: 2 | Status: AC

## 2020-05-24 LAB — ALDOSTERONE + RENIN ACTIVITY W/ RATIO
ALDO / PRA Ratio: 13.3 (ref 0.0–30.0)
Aldosterone: 6.4 ng/dL (ref 0.0–30.0)
PRA LC/MS/MS: 0.483 ng/mL/hr (ref 0.167–5.380)

## 2020-06-02 NOTE — Progress Notes (Incomplete)
Cardiology Office Note  Date: 06/02/2020   ID: Dean Wilson, DOB 05-11-1960, MRN [id number]  PCP:  Glenda Chroman, MD  Cardiologist:  No primary care provider on file. Electrophysiologist:  None   Chief Complaint: Status post PCI   History of Present Illness: Dean Wilson is a 60 y.o. male with a history of hypertensive emergency, acute systolic heart failure, mild mitral regurgitation, nocturnal hypoxia, history of recent CVA, hypokalemia, ischemic cardiomyopathy, hyperlipidemia.  He was admitted to an outside hospital in March 2022 with acute CVA and found to have multiple small acute cortical/subcortical infarcts with posterior right frontal lobe and right parietal occipital lobes.  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  Had an echocardiogram revealing EF of 45%, normal RV, normal bubble study.  G1 DD, normal valves.  Carotid duplex without significant stenosis.  He was admitted to [facility] for acute systolic heart failure.  Received IV diuresis with good response.  Repeat echo demonstrated EF of 40 to 45% with possible LV thrombus.  Follow-up MRI suggested ischemic heart disease with less than 50% LGE of apical and basal lateral and inferior walls.  Was seen by Dr. Haroldine Laws April 17, 2020.  His losartan was changed to [facility].  A Zio patch was placed.  Cardiac catheterization was arranged for further evaluation.  Cardiac catheterization revealed severe stenosis in mid LAD with subsequent DES.  See cardiac catheterization report.  Plan was for DAPT therapy with aspirin and Plavix for at least 6 months.  He was discharged on aspirin 81 mg daily, carvedilol 25 mg p.o. twice daily, Plavix 75 mg p.o. daily.  Entresto 49/51 mg p.o. twice daily, hydralazine 25 mg p.o. 3 times daily.  Potassium chloride 20 mEq p.o. twice daily.  Crestor 20 mg p.o. daily, torsemide 20 mg p.o. daily     Past Medical History:  Diagnosis Date  . Essential hypertension   .  GERD (gastroesophageal reflux disease)   . PSVT (paroxysmal supraventricular tachycardia) ([place])   . Stroke [facility])     Past Surgical History:  Procedure Laterality Date  . CORONARY STENT INTERVENTION N/A 05/20/2020   Procedure: CORONARY STENT INTERVENTION;  Surgeon: Burnell Blanks, MD;  Location: [place] [facility];  Service: Cardiovascular;  Laterality: N/A;  . RIGHT/LEFT HEART CATH AND CORONARY ANGIOGRAPHY N/A 05/20/2020   Procedure: RIGHT/LEFT HEART CATH AND CORONARY ANGIOGRAPHY;  Surgeon: Jolaine Artist, MD;  Location: [place] [facility];  Service: Cardiovascular;  Laterality: N/A;  . UMBILICAL HERNIA REPAIR      Current Outpatient Medications  Medication Sig Dispense Refill  . amLODipine (NORVASC) 5 MG tablet Take 1 tablet by mouth daily.    . ASPIRIN LOW DOSE 81 MG EC tablet Take 81 mg by mouth daily.    . carvedilol (COREG) 25 MG tablet Take 25 mg by mouth 2 (two) times daily.    . clopidogrel (PLAVIX) 75 MG tablet Take 75 mg by mouth daily.    . DENTA 5000 PLUS 1.1 % CREA dental cream Take 1 application by mouth at bedtime.    . hydrALAZINE (APRESOLINE) 25 MG tablet Take 25 mg by mouth 3 (three) times daily.    . Multiple Vitamin (MULTIVITAMIN) tablet Take 1 tablet by mouth daily.    . pantoprazole (PROTONIX) 40 MG tablet Take 1 tablet (40 mg total) by mouth daily. 30 tablet 11  . potassium chloride SA (KLOR-CON) 20 MEQ tablet Take 20 mEq by mouth 2 (two) times daily.    Marland Kitchen  rosuvastatin (CRESTOR) 20 MG tablet Take 1 tablet (20 mg total) by mouth daily. 30 tablet 0  . sacubitril-valsartan (ENTRESTO) 49-51 MG Take 1 tablet by mouth 2 (two) times daily. 60 tablet 6  . spironolactone (ALDACTONE) 25 MG tablet Take 1 tablet (25 mg total) by mouth daily. 30 tablet 0  . torsemide (DEMADEX) 20 MG tablet Take 1 tablet (20 mg total) by mouth daily. 30 tablet 0   No current facility-administered medications for this visit.   Allergies:  Banana, Sunflower oil, and Tomato    Social History: The patient  reports that he quit smoking about 12 years ago. His smoking use included cigarettes. He has a 50.00 pack-year smoking history. He has never used smokeless tobacco. He reports that he does not drink alcohol and does not use drugs.   Family History: The patient's family history is not on file.   ROS:  Please see the history of present illness. Otherwise, complete review of systems is positive for {NONE DEFAULTED:18576::"none"}.  All other systems are reviewed and negative.   Physical Exam: VS:  There were no vitals taken for this visit., BMI There is no height or weight on file to calculate BMI.  Wt Readings from Last 3 Encounters:  05/20/20 275 lb (124.7 kg)  05/18/20 276 lb (125.2 kg)  05/07/20 271 lb 13.2 oz (123.3 kg)    General: Patient appears comfortable at rest. HEENT: Conjunctiva and lids normal, oropharynx clear with moist mucosa. Neck: Supple, no elevated JVP or carotid bruits, no thyromegaly. Lungs: Clear to auscultation, nonlabored breathing at rest. Cardiac: Regular rate and rhythm, no S3 or significant systolic murmur, no pericardial rub. Abdomen: Soft, nontender, no hepatomegaly, bowel sounds present, no guarding or rebound. Extremities: No pitting edema, distal pulses 2+. Skin: Warm and dry. Musculoskeletal: No kyphosis. Neuropsychiatric: Alert and oriented x3, affect grossly appropriate.  ECG:  {EKG/Telemetry Strips Reviewed:[phone]}  Recent Labwork: 05/04/2020: B Natriuretic Peptide 329.1 05/05/2020: ALT 21; AST 18; TSH 4.690 05/06/2020: Magnesium 2.1 05/07/2020: Platelets 223 05/18/2020: BUN 18; Creatinine, Ser 1.32 05/20/2020: Hemoglobin 13.3; Potassium 3.2; Sodium 147  No results found for: CHOL, TRIG, HDL, CHOLHDL, VLDL, LDLCALC, LDLDIRECT  Other Studies Reviewed Today:   Assessment and Plan:  1. CAD in native artery   2. Chronic systolic heart failure ([place])   3. Ischemic cardiomyopathy   4. Essential hypertension   5.  History of CVA (cerebrovascular accident)   6. Mixed hyperlipidemia    1. CAD in native artery ***  2. Chronic systolic heart failure (H[facility]) ***  3. Ischemic cardiomyopathy ***  4. Essential hypertension ***  5. History of CVA (cerebrovascular accident) ***  6. Mixed hyperlipidemia ***  Medication Adjustments/Labs and Tests Ordered: Current medicines are reviewed at length with the patient today.  Concerns regarding medicines are outlined above.   Disposition: Follow-up with ***  Signed, Levell July, NP 06/02/2020 9:38 PM    [facility] [address], [place], [place] [address] Phone: [phone]; Fax: [phone]

## 2020-06-03 ENCOUNTER — Encounter: Payer: Self-pay | Admitting: Family Medicine

## 2020-06-03 ENCOUNTER — Ambulatory Visit: Payer: BC Managed Care – PPO | Admitting: Family Medicine

## 2020-06-03 ENCOUNTER — Other Ambulatory Visit: Payer: Self-pay

## 2020-06-03 VITALS — BP 120/76 | HR 64 | Ht 72.0 in | Wt 267.2 lb

## 2020-06-03 DIAGNOSIS — I5022 Chronic systolic (congestive) heart failure: Secondary | ICD-10-CM | POA: Diagnosis not present

## 2020-06-03 DIAGNOSIS — I255 Ischemic cardiomyopathy: Secondary | ICD-10-CM

## 2020-06-03 DIAGNOSIS — I2510 Atherosclerotic heart disease of native coronary artery without angina pectoris: Secondary | ICD-10-CM | POA: Diagnosis not present

## 2020-06-03 DIAGNOSIS — I10 Essential (primary) hypertension: Secondary | ICD-10-CM | POA: Diagnosis not present

## 2020-06-03 DIAGNOSIS — Z8673 Personal history of transient ischemic attack (TIA), and cerebral infarction without residual deficits: Secondary | ICD-10-CM

## 2020-06-03 DIAGNOSIS — E782 Mixed hyperlipidemia: Secondary | ICD-10-CM

## 2020-06-03 MED ORDER — POTASSIUM CHLORIDE CRYS ER 20 MEQ PO TBCR: 20.0000 meq | EXTENDED_RELEASE_TABLET | Freq: Two times a day (BID) | ORAL | 3 refills | Status: DC

## 2020-06-03 NOTE — Progress Notes (Signed)
Cardiology Office Note  Date: 06/03/2020   ID: Dean Wilson, DOB 13-Apr-1960, MRN [id number]  PCP:  Glenda Chroman, MD  Cardiologist:  No primary care provider on file. Electrophysiologist:  None   Chief Complaint: Status post PCI   History of Present Illness: Dean Wilson is a 60 y.o. male with a history of hypertensive emergency, acute systolic heart failure, mild mitral regurgitation, nocturnal hypoxia, history of recent CVA, hypokalemia, ischemic cardiomyopathy, hyperlipidemia.  He was admitted to an outside hospital in March 2022 with acute CVA and found to have multiple small acute cortical/subcortical infarcts with posterior right frontal lobe and right parietal occipital lobes.  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  Had an echocardiogram revealing EF of 45%, normal RV, normal bubble study.  G1 DD, normal valves.  Carotid duplex without significant stenosis.  He was admitted to [facility] for acute systolic heart failure.  Received IV diuresis with good response.  Repeat echo demonstrated EF of 40 to 45% with possible LV thrombus.  Follow-up MRI suggested ischemic heart disease with less than 50% LGE of apical and basal lateral and inferior walls.  Was seen by Dr. Haroldine Laws April 17, 2020.  His losartan was changed to [facility].  A Zio patch was placed.  Cardiac catheterization was arranged for further evaluation.  Cardiac catheterization revealed severe stenosis in mid LAD with subsequent DES.  See cardiac catheterization report.  Plan was for DAPT therapy with aspirin and Plavix for at least 6 months.  He was discharged on aspirin 81 mg daily, carvedilol 25 mg p.o. twice daily, Plavix 75 mg p.o. daily.  Entresto 49/51 mg p.o. twice daily, hydralazine 25 mg p.o. 3 times daily.  Potassium chloride 20 mEq p.o. twice daily.  Crestor 20 mg p.o. daily, torsemide 20 mg p.o. daily  He is here status post PCI with DES to mid LAD.  He denies any anginal  symptoms.  States he does have some mild shortness of breath on moderate activity but otherwise no issues.  He denies any orthostatic symptoms, CVA or TIA-like symptoms, palpitations or arrhythmias, PND, orthopnea.  States he is pending receiving a CPAP machine for sleep apnea.  He denies any bleeding.  Denies any claudication-like symptoms, DVT or PE-like symptoms.  States he is compliant with all of his medications since discharge.  Blood pressure today 120/76.  Heart rate of 64.  He denies any issues with his right radial catheterization access site.  States he has had a couple of episodes of low blood pressures.  States 1 blood pressure recently was 86 systolic, however other blood pressures have been higher at 834 systolic.   Past Medical History:  Diagnosis Date  . Essential hypertension   . GERD (gastroesophageal reflux disease)   . PSVT (paroxysmal supraventricular tachycardia) ([place])   . Stroke [facility])     Past Surgical History:  Procedure Laterality Date  . CORONARY STENT INTERVENTION N/A 05/20/2020   Procedure: CORONARY STENT INTERVENTION;  Surgeon: Burnell Blanks, MD;  Location: [place] [facility];  Service: Cardiovascular;  Laterality: N/A;  . RIGHT/LEFT HEART CATH AND CORONARY ANGIOGRAPHY N/A 05/20/2020   Procedure: RIGHT/LEFT HEART CATH AND CORONARY ANGIOGRAPHY;  Surgeon: Jolaine Artist, MD;  Location: [place] [facility];  Service: Cardiovascular;  Laterality: N/A;  . UMBILICAL HERNIA REPAIR      Current Outpatient Medications  Medication Sig Dispense Refill  . amLODipine (NORVASC) 5 MG tablet Take 1 tablet by mouth daily.    Marland Kitchen  ASPIRIN LOW DOSE 81 MG EC tablet Take 81 mg by mouth daily.    . carvedilol (COREG) 25 MG tablet Take 25 mg by mouth 2 (two) times daily.    . clopidogrel (PLAVIX) 75 MG tablet Take 75 mg by mouth daily.    . DENTA 5000 PLUS 1.1 % CREA dental cream Take 1 application by mouth at bedtime.    . hydrALAZINE (APRESOLINE) 25 MG tablet Take 25 mg  by mouth 3 (three) times daily.    . Multiple Vitamin (MULTIVITAMIN) tablet Take 1 tablet by mouth daily.    . pantoprazole (PROTONIX) 40 MG tablet Take 1 tablet (40 mg total) by mouth daily. 30 tablet 11  . potassium chloride SA (KLOR-CON) 20 MEQ tablet Take 20 mEq by mouth 2 (two) times daily.    . rosuvastatin (CRESTOR) 20 MG tablet Take 1 tablet (20 mg total) by mouth daily. 30 tablet 0  . sacubitril-valsartan (ENTRESTO) 49-51 MG Take 1 tablet by mouth 2 (two) times daily. 60 tablet 6  . spironolactone (ALDACTONE) 25 MG tablet Take 1 tablet (25 mg total) by mouth daily. 30 tablet 0  . torsemide (DEMADEX) 20 MG tablet Take 1 tablet (20 mg total) by mouth daily. 30 tablet 0   No current facility-administered medications for this visit.   Allergies:  Banana, Sunflower oil, and Tomato   Social History: The patient  reports that he quit smoking about 12 years ago. His smoking use included cigarettes. He has a 50.00 pack-year smoking history. He has never used smokeless tobacco. He reports that he does not drink alcohol and does not use drugs.   Family History: The patient's family history is not on file.   ROS:  Please see the history of present illness. Otherwise, complete review of systems is positive for none.  All other systems are reviewed and negative.   Physical Exam: VS:  BP 120/76   Pulse 64   Ht 6' (1.829 m)   Wt 267 lb 3.2 oz (121.2 kg)   SpO2 97%   BMI 36.24 kg/m , BMI Body mass index is 36.24 kg/m.  Wt Readings from Last 3 Encounters:  06/03/20 267 lb 3.2 oz (121.2 kg)  05/20/20 275 lb (124.7 kg)  05/18/20 276 lb (125.2 kg)    General: Obese patient appears comfortable at rest. Neck: Supple, no elevated JVP or carotid bruits, no thyromegaly. Lungs: Clear to auscultation, nonlabored breathing at rest. Cardiac: Regular rate and rhythm, no S3 or significant systolic murmur, no pericardial rub. Extremities: No pitting edema, distal pulses 2+. Skin: Warm and dry.  Right  radial access site clean and dry with 2+ pulses Musculoskeletal: No kyphosis. Neuropsychiatric: Alert and oriented x3, affect grossly appropriate.  ECG:  EKG May 20, 2020 sinus bradycardia with rate of 58.  Recent Labwork: 05/04/2020: B Natriuretic Peptide 329.1 05/05/2020: ALT 21; AST 18; TSH 4.690 05/06/2020: Magnesium 2.1 05/07/2020: Platelets 223 05/18/2020: BUN 18; Creatinine, Ser 1.32 05/20/2020: Hemoglobin 13.3; Potassium 3.2; Sodium 147  No results found for: CHOL, TRIG, HDL, CHOLHDL, VLDL, LDLCALC, LDLDIRECT  Other Studies Reviewed Today: Conclusion    Mid RCA lesion is 40% stenosed.  Dist RCA lesion is 75% stenosed with 75% stenosed side branch in RPDA.  2nd Mrg lesion is 100% stenosed.  Ost Cx to Prox Cx lesion is 30% stenosed.  Dist LAD lesion is 95% stenosed.  Ost LAD to Prox LAD lesion is 50% stenosed.  Mid LAD lesion is 50% stenosed.  1st Diag lesion is  95% stenosed.   Findings:  Ao = 149/78 (104) LV =  139/10 RA =  3 RV = 19/3 PA = 21/9 (14) PCW = 4 Fick cardiac output/index = 6.2/2.5 PVR = 1.5 WU Ao sat = 99% PA sat = 72%, 76%  Assessment: 1. 3v CAD 2. EF 40-45% 3. Well compensated hemodynamics  Plan/Discussion:  Plan PCI of LAD. Distal RCA borderline significant but does not feed much myocardium. Will treat that lesion medically for now unless he develops angina.   Diagnostic Dominance: Right    Intervention    Conclusion    Prox LAD lesion is 50% stenosed.  Mid LAD lesion is 50% stenosed.  Dist LAD lesion is 95% stenosed.  A drug-eluting stent was successfully placed using a SYNERGY XD 2.75X28.  Post intervention, there is a 0% residual stenosis.   1. Severe stenosis mid LAD 2. Successful PTCA/DES x 1 mid LAD 3. See Dr. Clayborne Dana report from the diagnostic cath for further details of the other vessels  Recommendations: DAPT with ASA and Plavix for at least six months. Same day post PCI  discharge  Diagnostic Dominance: Right    Intervention       Echocardiogram 05/05/2020 1.Apical images concerning for LV thrombus. GIven drop in EF and recent CVA, echo contrast performed. On echo images, there is no definite thrombus. There appears to be trabeculation/musculature at the apex, but there is also slow moving contrast at the true apex. Given drop in EF and abnormal LV features, would consider cardiac MRI.Marland Kitchen Left ventricular ejection fraction, by estimation, is 40 to 45%. The left ventricle has mildly decreased function. The left ventricle demonstrates global hypokinesis. The left ventricular internal cavity size was mildly dilated. There is moderate concentric left ventricular hypertrophy. Left ventricular diastolic parameters are indeterminate. Elevated left ventricular end-diastolic pressure. 2. Right ventricular systolic function is normal. The right ventricular size is normal. 3. A small pericardial effusion is present. 4. The mitral valve is grossly normal. Mild mitral valve regurgitation. 5. The aortic valve is grossly normal. Aortic valve regurgitation is not visualized. 6. The inferior vena cava is normal in size with greater than 50% respiratory variability, suggesting right atrial pressure of 3 mmHg. Comparison(s): Prior images unable to be directly viewed, comparison made by report only.   Assessment and Plan:  1. CAD in native artery   2. Chronic systolic heart failure ([place])   3. Ischemic cardiomyopathy   4. Essential hypertension   5. History of CVA (cerebrovascular accident)   6. Mixed hyperlipidemia    1. CAD in native artery Recent cardiac catheterization revealed mid RCA lesion 40%, distal RCA lesion 75% with 75% stenosed side branch and RPDA, second marginal lesion 100%, ostial circumflex proximal circumflex lesion 30%, distal LAD lesion 95%, ostial LAD to proximal LAD 80%, mid LAD lesion 50%, first diagonal lesion 95%.  He had successful PTCA  with DES x1 to mid LAD.  Continuing DAPT therapy with aspirin and Plavix for at least 6 months.  Continue aspirin 81 mg daily, continue Plavix 81 mg daily, continue carvedilol 25 mg p.o. twice daily  2. Chronic systolic heart failure ([place]) 3.  Ischemic cardiomyopathy Recent echocardiogram on 05/05/2020 with EF of 40 to 45%.  LV global hypokinesis, moderate concentric LVH mild MR. Continue carvedilol 25 mg p.o. twice daily.  Continue Entresto 49/51 mg p.o. twice daily.  Continue hydralazine 25 mg p.o. 3 times daily, continue spironolactone 25 mg p.o. daily.  Continue torsemide 20 mg p.o. daily.  Get  repeat echocardiogram in 3 months prior to 83-month follow-up.  4. Essential hypertension Blood pressure well controlled on current medical therapy with BP of 120/76 today.  Continue hydralazine 25 mg p.o. 3 times daily.  Continue spironolactone 25 mg p.o. daily.  Continue torsemide 20 mg p.o. daily.  5. History of CVA (cerebrovascular accident) Recent history of CVA.  No noted residual neurological deficits.  Continue aspirin 81 mg daily.  Continue Plavix 81 mg daily.  6. Mixed hyperlipidemia Continue rosuvastatin 20 mg p.o. daily.  Medication Adjustments/Labs and Tests Ordered: Current medicines are reviewed at length with the patient today.  Concerns regarding medicines are outlined above.   Disposition: Follow-up with Dr. Harl Bowie or APP 3 months.  Signed, Levell July, NP 06/03/2020 8:59 AM    [facility] [place], [place], [place] [address] Phone: [phone]; Fax: [phone]

## 2020-06-03 NOTE — Patient Instructions (Signed)
Medication Instructions:  Continue all current medications.  Labwork: none  Testing/Procedures: Your physician has requested that you have an echocardiogram. Echocardiography is a painless test that uses sound waves to create images of your heart. It provides your doctor with information about the size and shape of your heart and how well your heart's chambers and valves are working. This procedure takes approximately one hour. There are no restrictions for this procedure - DUE JUST PRIOR TO NEXT OFFICE VISIT   Follow-Up: 3 months   Any Other Special Instructions Will Be Listed Below (If Applicable).  If you need a refill on your cardiac medications before your next appointment, please call your pharmacy.  

## 2020-06-07 ENCOUNTER — Other Ambulatory Visit: Payer: Self-pay | Admitting: *Deleted

## 2020-06-08 NOTE — Addendum Note (Signed)
Encounter addended by: Micki Riley, RN on: 06/08/2020 10:15 AM  Actions taken: Imaging Exam ended

## 2020-06-09 ENCOUNTER — Telehealth: Payer: Self-pay | Admitting: Family Medicine

## 2020-06-09 MED ORDER — TORSEMIDE 20 MG PO TABS: 20.0000 mg | ORAL_TABLET | Freq: Every day | ORAL | 0 refills | Status: DC

## 2020-06-09 NOTE — Telephone Encounter (Signed)
Medication refill request sent to [facility].

## 2020-06-09 NOTE — Telephone Encounter (Signed)
Patient had Rx filled at [facility], and would like to have it sent to [facility]. [employer] name Torsemide 20 MG

## 2020-06-10 ENCOUNTER — Other Ambulatory Visit (HOSPITAL_COMMUNITY): Payer: Self-pay

## 2020-06-13 ENCOUNTER — Telehealth: Payer: Self-pay | Admitting: Family Medicine

## 2020-06-13 MED ORDER — SPIRONOLACTONE 25 MG PO TABS: 25.0000 mg | ORAL_TABLET | Freq: Every day | ORAL | 1 refills | Status: DC

## 2020-06-13 NOTE — Telephone Encounter (Signed)
*  STAT* If patient is at the pharmacy, call can be transferred to refill team.   1. Which medications need to be refilled? (please list name of each medication and dose if known)   spironolactone (ALDACTONE) 25 MG    2. Which pharmacy/location (including street and city if local pharmacy) is medication to be sent to?  [facility]   3. Do they need a 30 day or 90 day supply? 30   Patient only has 1 pill

## 2020-06-29 ENCOUNTER — Other Ambulatory Visit: Payer: Self-pay

## 2020-06-29 ENCOUNTER — Encounter (HOSPITAL_COMMUNITY): Payer: Self-pay | Admitting: Internal Medicine

## 2020-06-29 ENCOUNTER — Ambulatory Visit (HOSPITAL_COMMUNITY)
Admission: RE | Admit: 2020-06-29 | Discharge: 2020-06-29 | Disposition: A | Discharge status: Home / Self Care | Payer: BC Managed Care – PPO | Source: Ambulatory Visit | Attending: Internal Medicine | Admitting: Internal Medicine

## 2020-06-29 VITALS — BP 140/80 | HR 71 | Wt 262.8 lb

## 2020-06-29 DIAGNOSIS — Z87891 Personal history of nicotine dependence: Secondary | ICD-10-CM | POA: Diagnosis not present

## 2020-06-29 DIAGNOSIS — I255 Ischemic cardiomyopathy: Secondary | ICD-10-CM | POA: Insufficient documentation

## 2020-06-29 DIAGNOSIS — I10 Essential (primary) hypertension: Secondary | ICD-10-CM | POA: Diagnosis not present

## 2020-06-29 DIAGNOSIS — G4733 Obstructive sleep apnea (adult) (pediatric): Secondary | ICD-10-CM | POA: Insufficient documentation

## 2020-06-29 DIAGNOSIS — I5022 Chronic systolic (congestive) heart failure: Secondary | ICD-10-CM | POA: Diagnosis not present

## 2020-06-29 DIAGNOSIS — I2510 Atherosclerotic heart disease of native coronary artery without angina pectoris: Secondary | ICD-10-CM | POA: Diagnosis not present

## 2020-06-29 DIAGNOSIS — Z79899 Other long term (current) drug therapy: Secondary | ICD-10-CM | POA: Insufficient documentation

## 2020-06-29 DIAGNOSIS — Z955 Presence of coronary angioplasty implant and graft: Secondary | ICD-10-CM | POA: Diagnosis not present

## 2020-06-29 DIAGNOSIS — Z8673 Personal history of transient ischemic attack (TIA), and cerebral infarction without residual deficits: Secondary | ICD-10-CM | POA: Insufficient documentation

## 2020-06-29 DIAGNOSIS — Z7982 Long term (current) use of aspirin: Secondary | ICD-10-CM | POA: Diagnosis not present

## 2020-06-29 DIAGNOSIS — Z7902 Long term (current) use of antithrombotics/antiplatelets: Secondary | ICD-10-CM | POA: Diagnosis not present

## 2020-06-29 DIAGNOSIS — I110 Hypertensive heart disease with heart failure: Secondary | ICD-10-CM | POA: Insufficient documentation

## 2020-06-29 LAB — LIPID PANEL
Cholesterol: 173 mg/dL (ref 0–200)
HDL: 41 mg/dL (ref >40)
LDL Cholesterol: 97 mg/dL (ref 0–99)
Total CHOL/HDL Ratio: 4.2 RATIO
Triglycerides: 173 mg/dL — ABNORMAL HIGH (ref <150)
VLDL: 35 mg/dL (ref 0–40)

## 2020-06-29 LAB — BASIC METABOLIC PANEL
Anion gap: 10 (ref 5–15)
BUN: 40 mg/dL — ABNORMAL HIGH (ref 8–23)
CO2: 25 mmol/L (ref 22–32)
Calcium: 9.4 mg/dL (ref 8.9–10.3)
Chloride: 99 mmol/L (ref 98–111)
Creatinine, Ser: 1.97 mg/dL — ABNORMAL HIGH (ref 0.61–1.24)
GFR, Estimated: 38 mL/min — ABNORMAL LOW (ref >60)
Glucose, Bld: 97 mg/dL (ref 70–99)
Potassium: 5.1 mmol/L (ref 3.5–5.1)
Sodium: 134 mmol/L — ABNORMAL LOW (ref 135–145)

## 2020-06-29 LAB — BRAIN NATRIURETIC PEPTIDE: B Natriuretic Peptide: 102.1 pg/mL — ABNORMAL HIGH (ref 0.0–100.0)

## 2020-06-29 MED ORDER — ENTRESTO 97-103 MG PO TABS: 1.0000 | ORAL_TABLET | Freq: Two times a day (BID) | ORAL | 3 refills | Status: DC

## 2020-06-29 NOTE — Addendum Note (Signed)
Encounter addended by: Shonna Chock, CMA on: 1/85/9093 4:07 PM  Actions taken: Pharmacy for encounter modified, Order list changed, Diagnosis association updated, Clinical Note Signed, Charge Capture section accepted

## 2020-06-29 NOTE — Patient Instructions (Signed)
Labs done today. We will contact you only if your labs are abnormal.  INCREASE Entresto to 97-103mg  (1 tablet) by mouth 2 times daily.  No other medication changes were made. Please continue all current medications as prescribed.  Your physician recommends that you schedule a follow-up appointment in: 3 months  If you have any questions or concerns before your next appointment please send [place] a message through [place] or call our office at [phone].    TO LEAVE A MESSAGE FOR THE NURSE SELECT OPTION 2, PLEASE LEAVE A MESSAGE INCLUDING: . YOUR NAME . DATE OF BIRTH . CALL BACK NUMBER . REASON FOR CALL**this is important as we prioritize the call backs  YOU WILL RECEIVE A CALL BACK THE SAME DAY AS LONG AS YOU CALL BEFORE 4:00 PM   Do the following things EVERYDAY: 1) Weigh yourself in the morning before breakfast. Write it down and keep it in a log. 2) Take your medicines as prescribed 3) Eat low salt foods--Limit salt (sodium) to 2000 mg per day.  4) Stay as active as you can everyday 5) Limit all fluids for the day to less than 2 liters   At the [facility], you and your health needs are our priority. As part of our continuing mission to provide you with exceptional heart care, we have created designated Provider Care Teams. These Care Teams include your primary Cardiologist (physician) and Advanced Practice Providers (APPs- Physician Assistants and Nurse Practitioners) who all work together to provide you with the care you need, when you need it.   You may see any of the following providers on your designated Care Team at your next follow up: Marland Kitchen Dr Glori Bickers . Dr Loralie Champagne . Darrick Grinder, NP . Lyda Jester, PA . Audry Riles, PharmD   Please be sure to bring in all your medications bottles to every appointment.

## 2020-06-29 NOTE — Progress Notes (Signed)
Advanced HF Clinic Note  PCP: Jerene Bears Primary Cardiologist: None  HPI:  Dean Wilson is a 60 y.o.  male  with poorly-controlled HTN, CVA and systolic HF due to iCM.   Admitted at outside hospital  3/22 with acute CVA symptoms found to have multiple small acute cortical/subcortical infarcts within posterior right frontal lobe and right parietooccipital lobes. Also had evidence of small chronic right cerebellar infarct.  During his workup he had an echo with EF 45%, normal RV, normal bubble study, G1DD, normal valves, collapsible IVC.  Carotid duplex without significant stenosis  Admitted to [facility] in 4/22 with HF.  Had a repeat ECHO which demonstrated EF 40-45% and possible LV thrombus with follow up cardiac MRI recommended.  Follow up MRI suggested ischemic heart disease with <50% LGE of the apical and basal lateral and inferior walls. He was started on GDMT and discharged with close follow up.    cMRI 4/22: EF 32%. LG consistent with prior myocardial infarction in the basal to apical lateral wall and mid inferior wall. LGE is <50% transmural in these regions  Seen in [facility] and we arranged cath.  Cath 05/20/20  1. LM ok 2. LAD 50% ostial and mid. 95% distal 3. OM-2 100% 4. RCA small vessel with tandem 75%  Underwent PCI mid to distal LAD.   Ao = 149/78 (104) LV =  139/10 RA =  3 RV = 19/3 PA = 21/9 (14) PCW = 4 Fick cardiac output/index = 6.2/2.5 PVR = 1.5 WU Ao sat = 99% PA sat = 72%, 76%  Zio 5/22: Normal  Here for f/u. Here with his wife. Doing pretty well. Says he is getting better slowly. Now can walk a mile at a time but takes him a while to recover. Needs to stop and rest at times. No CP, edema, orthopnea or PND. 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 Compliant with meds.    SH:  Social History   Socioeconomic History  . Marital status: Married    Spouse name: Luciano Cutter  . Number of children: 2  . Years of  education: Not on file  . Highest education level: High school graduate  Occupational History  . Occupation: Primary school teacher    Employer: [employer]  Tobacco Use  . Smoking status: Former Smoker    Packs/day: 2.00    Years: 25.00    Pack years: 50.00    Types: Cigarettes    Quit date: 01/26/2008    Years since quitting: 12.4  . Smokeless tobacco: Never Used  Vaping Use  . Vaping Use: Never used  Substance and Sexual Activity  . Alcohol use: Never  . Drug use: No  . Sexual activity: Yes    Birth control/protection: None  Other Topics Concern  . Not on file  Social History Narrative  . Not on file   Social Determinants of Health   Financial Resource Strain: Low Risk   . Difficulty of Paying Living Expenses: Not very hard  Food Insecurity: No Food Insecurity  . Worried About Charity fundraiser in the Last Year: Never true  . Ran Out of Food in the Last Year: Never true  Transportation Needs: No Transportation Needs  . Lack of Transportation (Medical): No  . Lack of Transportation (Non-Medical): No  Physical Activity: Not on file  Stress: Not on file  Social Connections: Not on file  Intimate Partner Violence: Not  on file    FH:  Family History  Problem Relation Age of Onset  . Anesthesia problems Neg Hx   . Hypotension Neg Hx   . Malignant hyperthermia Neg Hx   . Pseudochol deficiency Neg Hx     Past Medical History:  Diagnosis Date  . Essential hypertension   . GERD (gastroesophageal reflux disease)   . PSVT (paroxysmal supraventricular tachycardia) ([place])   . Stroke [facility])     Current Outpatient Medications  Medication Sig Dispense Refill  . amLODipine (NORVASC) 5 MG tablet Take 1 tablet by mouth daily.    . ASPIRIN LOW DOSE 81 MG EC tablet Take 81 mg by mouth daily.    . carvedilol (COREG) 25 MG tablet Take 25 mg by mouth 2 (two) times daily.    . clopidogrel (PLAVIX) 75 MG tablet Take 75 mg by mouth daily.    . DENTA 5000 PLUS 1.1 % CREA dental  cream Take 1 application by mouth at bedtime.    . hydrALAZINE (APRESOLINE) 25 MG tablet Take 25 mg by mouth 3 (three) times daily.    . Multiple Vitamin (MULTIVITAMIN) tablet Take 1 tablet by mouth daily.    . pantoprazole (PROTONIX) 40 MG tablet Take 1 tablet (40 mg total) by mouth daily. 30 tablet 11  . potassium chloride SA (KLOR-CON) 20 MEQ tablet Take 1 tablet (20 mEq total) by mouth 2 (two) times daily. 180 tablet 3  . rosuvastatin (CRESTOR) 20 MG tablet Take 1 tablet (20 mg total) by mouth daily. 30 tablet 0  . sacubitril-valsartan (ENTRESTO) 49-51 MG Take 1 tablet by mouth 2 (two) times daily. 60 tablet 6  . spironolactone (ALDACTONE) 25 MG tablet Take 1 tablet (25 mg total) by mouth daily. 90 tablet 1  . torsemide (DEMADEX) 20 MG tablet Take 1 tablet (20 mg total) by mouth daily. 30 tablet 0   No current facility-administered medications for this encounter.    Vitals:   06/29/20 1510  BP: 140/80  Pulse: 71  SpO2: 98%  Weight: 119.2 kg (262 lb 12.8 oz)    PHYSICAL EXAM: General:  Well appearing. No resp difficulty HEENT: normal Neck: supple. no JVD. Carotids 2+ bilat; no bruits. No lymphadenopathy or thryomegaly appreciated. Cor: PMI nondisplaced. Regular rate & rhythm. No rubs, gallops or murmurs. Lungs: clear Abdomen: obese soft, nontender, nondistended. No hepatosplenomegaly. No bruits or masses. Good bowel sounds. Extremities: no cyanosis, clubbing, rash, edema Neuro: alert & orientedx3, cranial nerves grossly intact. moves all 4 extremities w/o difficulty. Affect pleasant   ASSESSMENT & PLAN:  1. Chronic Systolic CHF: -ECHO 04/6627 with EF 45%, G1DD, no valvular disease, normal RV -Cardiac MRI 05/2020 EF 32% with <50% subendocardial LGE of the apical and basal lateral and inferior walls suggestive of prior infarction and viable myocardium - cath 4/22 EF 40-45%. 3v CAD s/p DES to LAD - Mildly improved. NYHA Class II-III symptoms - Volume status ok on torsemide 20  daily  - Continue spiro 25mg  - Continue carvedilol 25mg  BID  - Increase entresto to 97/103 bid - Add jardiance at next visit   2. CAD - cath 4/22 EF 40-45%. 3v CAD s/p DES to LAD - no s/s angina  - Continue DAPT and statin - refused cardiac rehab   3. Hx of CVA: -MRI 04/2020 cortical/subcortical infarcts within posterior right frontal lobe and right parietooccipital lobes -Suspect embolic   4. OSA - recently had sleep study and waiting on CPAP  Glori Bickers, MD  3:16 PM

## 2020-07-12 ENCOUNTER — Other Ambulatory Visit: Payer: Self-pay | Admitting: Family Medicine

## 2020-07-14 NOTE — Progress Notes (Signed)
Left message to call back  

## 2020-08-25 ENCOUNTER — Ambulatory Visit (INDEPENDENT_AMBULATORY_CARE_PROVIDER_SITE_OTHER): Payer: BC Managed Care – PPO

## 2020-08-25 DIAGNOSIS — I255 Ischemic cardiomyopathy: Secondary | ICD-10-CM | POA: Diagnosis not present

## 2020-08-25 LAB — ECHOCARDIOGRAM COMPLETE
AR max vel: 3.42 cm2
AV Area VTI: 3.39 cm2
AV Area mean vel: 3.67 cm2
AV Mean grad: 3.6 mmHg
AV Peak grad: 7.2 mmHg
Ao pk vel: 1.34 m/s
Area-P 1/2: 1.89 cm2
Calc EF: 45.9 %
S' Lateral: 3.19 cm
Single Plane A2C EF: 40.7 %
Single Plane A4C EF: 42.4 %

## 2020-08-31 ENCOUNTER — Telehealth: Payer: Self-pay | Admitting: *Deleted

## 2020-08-31 NOTE — Telephone Encounter (Signed)
-----   Message from Verta Ellen., NP sent at 08/25/2020  6:08 PM EDT ----- Please call the patient let him know the echocardiogram shows the pumping function of his heart is improved and is back to normal from previous measurement on cardiac catheterization 05/2020.  The main pumping chamber is mildly thick and muscular with some stiffness.  Best management is to keep blood pressure at or below 130/80 consistently and manage all other risk factors.  He has a mildly leaking mitral valve.  Verta Ellen, NP  08/25/2020 6:03 PM

## 2020-08-31 NOTE — Telephone Encounter (Signed)
Laurine Blazer, LPN  [id number]  [id number] PM EDT Back to Top     Notified, copy to pcp.

## 2020-09-02 ENCOUNTER — Ambulatory Visit: Payer: BC Managed Care – PPO | Admitting: Family Medicine

## 2020-09-08 ENCOUNTER — Other Ambulatory Visit: Payer: Self-pay

## 2020-09-08 ENCOUNTER — Other Ambulatory Visit (HOSPITAL_COMMUNITY): Payer: Self-pay

## 2020-09-08 ENCOUNTER — Encounter (HOSPITAL_COMMUNITY): Payer: Self-pay | Admitting: Internal Medicine

## 2020-09-08 ENCOUNTER — Ambulatory Visit (HOSPITAL_COMMUNITY)
Admission: RE | Admit: 2020-09-08 | Discharge: 2020-09-08 | Disposition: A | Discharge status: Home / Self Care | Payer: BC Managed Care – PPO | Source: Ambulatory Visit | Attending: Internal Medicine | Admitting: Internal Medicine

## 2020-09-08 VITALS — BP 126/80 | HR 64 | Wt 257.6 lb

## 2020-09-08 DIAGNOSIS — Z7982 Long term (current) use of aspirin: Secondary | ICD-10-CM | POA: Insufficient documentation

## 2020-09-08 DIAGNOSIS — Z955 Presence of coronary angioplasty implant and graft: Secondary | ICD-10-CM | POA: Insufficient documentation

## 2020-09-08 DIAGNOSIS — I2510 Atherosclerotic heart disease of native coronary artery without angina pectoris: Secondary | ICD-10-CM | POA: Insufficient documentation

## 2020-09-08 DIAGNOSIS — I10 Essential (primary) hypertension: Secondary | ICD-10-CM

## 2020-09-08 DIAGNOSIS — I110 Hypertensive heart disease with heart failure: Secondary | ICD-10-CM | POA: Diagnosis not present

## 2020-09-08 DIAGNOSIS — Z8673 Personal history of transient ischemic attack (TIA), and cerebral infarction without residual deficits: Secondary | ICD-10-CM | POA: Insufficient documentation

## 2020-09-08 DIAGNOSIS — Z79899 Other long term (current) drug therapy: Secondary | ICD-10-CM | POA: Insufficient documentation

## 2020-09-08 DIAGNOSIS — Z87891 Personal history of nicotine dependence: Secondary | ICD-10-CM | POA: Diagnosis not present

## 2020-09-08 DIAGNOSIS — G4733 Obstructive sleep apnea (adult) (pediatric): Secondary | ICD-10-CM | POA: Diagnosis not present

## 2020-09-08 DIAGNOSIS — I5022 Chronic systolic (congestive) heart failure: Secondary | ICD-10-CM | POA: Diagnosis not present

## 2020-09-08 LAB — BASIC METABOLIC PANEL
Anion gap: 8 (ref 5–15)
BUN: 36 mg/dL — ABNORMAL HIGH (ref 8–23)
CO2: 23 mmol/L (ref 22–32)
Calcium: 9.6 mg/dL (ref 8.9–10.3)
Chloride: 107 mmol/L (ref 98–111)
Creatinine, Ser: 2.14 mg/dL — ABNORMAL HIGH (ref 0.61–1.24)
GFR, Estimated: 34 mL/min — ABNORMAL LOW (ref >60)
Glucose, Bld: 97 mg/dL (ref 70–99)
Potassium: 5.5 mmol/L — ABNORMAL HIGH (ref 3.5–5.1)
Sodium: 138 mmol/L (ref 135–145)

## 2020-09-08 LAB — CBC
HCT: 40.1 % (ref 39.0–52.0)
Hemoglobin: 12.8 g/dL — ABNORMAL LOW (ref 13.0–17.0)
MCH: 28.3 pg (ref 26.0–34.0)
MCHC: 31.9 g/dL (ref 30.0–36.0)
MCV: 88.7 fL (ref 80.0–100.0)
Platelets: 205 10*3/uL (ref 150–400)
RBC: 4.52 MIL/uL (ref 4.22–5.81)
RDW: 14.3 % (ref 11.5–15.5)
WBC: 6.9 10*3/uL (ref 4.0–10.5)
nRBC: 0 % (ref 0.0–0.2)

## 2020-09-08 LAB — BRAIN NATRIURETIC PEPTIDE: B Natriuretic Peptide: 127.5 pg/mL — ABNORMAL HIGH (ref 0.0–100.0)

## 2020-09-08 MED ORDER — EMPAGLIFLOZIN 10 MG PO TABS
10.0000 mg | ORAL_TABLET | Freq: Every day | ORAL | 11 refills | Status: DC
Start: 2020-09-08 — End: 2023-03-20

## 2020-09-08 MED ORDER — TORSEMIDE 20 MG PO TABS: 20.0000 mg | ORAL_TABLET | Freq: Every day | ORAL | 3 refills | Status: DC | PRN

## 2020-09-08 NOTE — Patient Instructions (Addendum)
EKG done today.  Labs done today. We will contact you only if your labs are abnormal.  DECREASE Lasix to '20mg'$  (1 tablet) by mouth daily as needed   START Jardiance '10mg'$  (1 tablet) by mouth daily.   No other medication changes were made. Please continue all current medications as prescribed.  Your physician recommends that you schedule a follow-up appointment in: 3 months  If you have any questions or concerns before your next appointment please send [place] a message through [place] or call our office at [phone].    TO LEAVE A MESSAGE FOR THE NURSE SELECT OPTION 2, PLEASE LEAVE A MESSAGE INCLUDING: YOUR NAME DATE OF BIRTH CALL BACK NUMBER REASON FOR CALL**this is important as we prioritize the call backs  YOU WILL RECEIVE A CALL BACK THE SAME DAY AS LONG AS YOU CALL BEFORE 4:00 PM   Do the following things EVERYDAY: Weigh yourself in the morning before breakfast. Write it down and keep it in a log. Take your medicines as prescribed Eat low salt foods--Limit salt (sodium) to 2000 mg per day.  Stay as active as you can everyday Limit all fluids for the day to less than 2 liters   At the [facility], you and your health needs are our priority. As part of our continuing mission to provide you with exceptional heart care, we have created designated Provider Care Teams. These Care Teams include your primary Cardiologist (physician) and Advanced Practice Providers (APPs- Physician Assistants and Nurse Practitioners) who all work together to provide you with the care you need, when you need it.   You may see any of the following providers on your designated Care Team at your next follow up: Dr Glori Bickers Dr Haynes Kerns, NP Lyda Jester, [place] Audry Riles, PharmD   Please be sure to bring in all your medications bottles to every appointment.

## 2020-09-08 NOTE — Progress Notes (Signed)
Advanced HF Clinic Note  PCP: Jerene Bears Primary Cardiologist: None  HPI:  Dean Wilson is a 60 y.o.  male  with poorly-controlled HTN, CVA and systolic HF due to iCM.   Admitted at outside hospital  3/22 with acute CVA symptoms found to have multiple small acute cortical/subcortical infarcts within posterior right frontal lobe and right parietooccipital lobes. Also had evidence of small chronic right cerebellar infarct.  During his workup he had an echo with EF 45%, normal RV, normal bubble study, G1DD, normal valves, collapsible IVC.  Carotid duplex without significant stenosis  Admitted to [facility] in 4/22 with HF.  Had a repeat ECHO which demonstrated EF 40-45% and possible LV thrombus with follow up cardiac MRI recommended.  Follow up MRI suggested ischemic heart disease with <50% LGE of the apical and basal lateral and inferior walls. He was started on GDMT and discharged with close follow up.    cMRI 4/22: EF 32%. LG consistent with prior myocardial infarction in the basal to apical lateral wall and mid inferior wall. LGE is <50% transmural in these regions  Seen in [facility] and we arranged cath.  Cath 05/20/20  1. LM ok 2. LAD 50% ostial and mid. 95% distal 3. OM-2 100% 4. RCA small vessel with tandem 75%  Underwent PCI mid to distal LAD.   Ao = 149/78 (104) LV =  139/10 RA =  3 RV = 19/3 PA = 21/9 (14) PCW = 4 Fick cardiac output/index = 6.2/2.5 PVR = 1.5 WU Ao sat = 99% PA sat = 72%, 76%  Zio 5/22: Normal  Last visit 5/22 continuing to make incremental progress walking more was able to walk a mile but going slow.  Has been doing well since last visit.  If getting up quickly gets a little light headed but otherwise no issues with medications.  Still working in the [employer].  Not walking much with the heat but active at work.  Considering joining [employer].  Weight at 257lbs stable.  Had an ECHO 08/25/2020 EF now 55-60%, G1DD, normal RV, mild  MR   SH:  Social History   Socioeconomic History   Marital status: Married    Spouse name: Luciano Cutter   Number of children: 2   Years of education: Not on file   Highest education level: High school graduate  Occupational History   Occupation: Primary school teacher    Employer: [employer]  Tobacco Use   Smoking status: Former    Packs/day: 2.00    Years: 25.00    Pack years: 50.00    Types: Cigarettes    Quit date: 01/26/2008    Years since quitting: 12.6   Smokeless tobacco: Never  Vaping Use   Vaping Use: Never used  Substance and Sexual Activity   Alcohol use: Never   Drug use: No   Sexual activity: Yes    Birth control/protection: None  Other Topics Concern   Not on file  Social History Narrative   Not on file   Social Determinants of Health   Financial Resource Strain: Low Risk    Difficulty of Paying Living Expenses: Not very hard  Food Insecurity: No Food Insecurity   Worried About Charity fundraiser in the Last Year: Never true   [place] in the Last Year: Never true  Transportation Needs: No Transportation Needs   Lack of Transportation (Medical): No   Lack of Transportation (Non-Medical): No  Physical Activity:  Not on file  Stress: Not on file  Social Connections: Not on file  Intimate Partner Violence: Not on file    FH:  Family History  Problem Relation Age of Onset   Anesthesia problems Neg Hx    Hypotension Neg Hx    Malignant hyperthermia Neg Hx    Pseudochol deficiency Neg Hx     Past Medical History:  Diagnosis Date   Essential hypertension    GERD (gastroesophageal reflux disease)    PSVT (paroxysmal supraventricular tachycardia) (HCC)    Stroke (HCC)     Current Outpatient Medications  Medication Sig Dispense Refill   amLODipine (NORVASC) 5 MG tablet Take 1 tablet by mouth daily.     ASPIRIN LOW DOSE 81 MG EC tablet Take 81 mg by mouth daily.     carvedilol (COREG) 25 MG tablet Take 25 mg by mouth 2 (two) times daily.      clopidogrel (PLAVIX) 75 MG tablet Take 75 mg by mouth daily.     DENTA 5000 PLUS 1.1 % CREA dental cream Take 1 application by mouth at bedtime.     hydrALAZINE (APRESOLINE) 25 MG tablet Take 25 mg by mouth 3 (three) times daily.     Multiple Vitamin (MULTIVITAMIN) tablet Take 1 tablet by mouth daily.     pantoprazole (PROTONIX) 40 MG tablet Take 1 tablet (40 mg total) by mouth daily. 30 tablet 11   potassium chloride SA (KLOR-CON) 20 MEQ tablet Take 1 tablet (20 mEq total) by mouth 2 (two) times daily. 180 tablet 3   rosuvastatin (CRESTOR) 20 MG tablet Take 1 tablet (20 mg total) by mouth daily. 30 tablet 0   sacubitril-valsartan (ENTRESTO) 97-103 MG Take 1 tablet by mouth 2 (two) times daily. 180 tablet 3   spironolactone (ALDACTONE) 25 MG tablet Take 1 tablet (25 mg total) by mouth daily. 90 tablet 1   torsemide (DEMADEX) 20 MG tablet TAKE ONE TABLET BY MOUTH ONCE DAILY 90 tablet 3   No current facility-administered medications for this encounter.    Vitals:   09/08/20 1554  Weight: 116.8 kg (257 lb 9.6 oz)    PHYSICAL EXAM: General:  Well appearing. No resp difficulty HEENT: normal Neck: supple. no JVD. Carotids 2+ bilat; no bruits. No lymphadenopathy or thryomegaly appreciated. Cor: PMI nondisplaced. Regular rate & rhythm. No rubs, gallops or murmurs. Lungs: clear Abdomen: obese soft, nontender, nondistended. No hepatosplenomegaly. No bruits or masses. Good bowel sounds. Extremities: no cyanosis, clubbing, rash, edema Neuro: alert & orientedx3, cranial nerves grossly intact. moves all 4 extremities w/o difficulty. Affect pleasant   ASSESSMENT & PLAN:  1. Chronic Systolic CHF: -ECHO [id number] with EF 45%, G1DD, no valvular disease, normal RV -Cardiac MRI 05/2020 EF 32% with <50% subendocardial LGE of the apical and basal lateral and inferior walls suggestive of prior infarction and viable myocardium - cath 4/22 EF 40-45%. 3v CAD s/p DES to LAD - ECHO 08/25/2020 EF now 55-60%,  G1DD, normal RV, mild MR - Continued improvement. NYHA Class II-III symptoms - Volume status ok switch torsemide to '20mg'$  PRN - Continue spiro '25mg'$  - Continue carvedilol '25mg'$  BID  - Continue Entresto 97/103 bid - Add jardiance 10  2. CAD - cath 4/22 EF 40-45%. 3v CAD s/p DES to LAD - no s/s angina  - Continue DAPT and statin - refused cardiac rehab   3. Hx of CVA: -MRI 04/2020 cortical/subcortical infarcts within posterior right frontal lobe and right parietooccipital lobes -no afib/flutter on zio -Suspected embolic  4. OSA - Still waiting on CPAP  Katherine Roan, MD  3:56 PM  Patient seen and examined with the above-signed Advanced Practice Provider and/or Housestaff. I personally reviewed laboratory data, imaging studies and relevant notes. I independently examined the patient and formulated the important aspects of the plan. I have edited the note to reflect any of my changes or salient points. I have personally discussed the plan with the patient and/or family.  Feels much better with titration of GDMT and PCI of LAD. Currently NYHA II. No CP, edema, orthopnea or PND.   Echo reviewed personally and EF now normalized at 55-60%  General:  Well appearing. No resp difficulty HEENT: normal Neck: supple. no JVD. Carotids 2+ bilat; no bruits. No lymphadenopathy or thryomegaly appreciated. Cor: PMI nondisplaced. Regular rate & rhythm. No rubs, gallops or murmurs. Lungs: clear Abdomen: obese soft, nontender, nondistended. No hepatosplenomegaly. No bruits or masses. Good bowel sounds. Extremities: no cyanosis, clubbing, rash, edema Neuro: alert & orientedx3, cranial nerves grossly intact. moves all 4 extremities w/o difficulty. Affect pleasant  Much improved. Review of echo shows full EF recovery. Will stop torsemide and switch to Jardiance. Check labs today, Encouraged him to get a FitBit to make sure he is getting 5K+ steps per day at his job - otherwise needs exercise program,    Glori Bickers, MD  12:14 AM

## 2020-09-09 ENCOUNTER — Telehealth (HOSPITAL_COMMUNITY): Payer: Self-pay | Admitting: *Deleted

## 2020-09-09 DIAGNOSIS — I5022 Chronic systolic (congestive) heart failure: Secondary | ICD-10-CM

## 2020-09-09 MED ORDER — SPIRONOLACTONE 25 MG PO TABS: 12.5000 mg | ORAL_TABLET | Freq: Every day | ORAL | 1 refills | Status: DC

## 2020-09-09 NOTE — Telephone Encounter (Signed)
Pt aware, agreeable, and verbalized understanding, he will get repeat labs in [place] early next week, order placed

## 2020-09-09 NOTE — Telephone Encounter (Signed)
-----   Message from Jolaine Artist, MD sent at 09/08/2020 11:38 PM EDT ----- Torsemide stopped in clinic  Stop kcl now. Cut spiro to 12.5  Repeat BMET early next week    ----- Message ----- From: Interface, Lab In [place] Sent: 09/08/2020   5:22 PM EDT To: Jolaine Artist, MD

## 2020-09-22 ENCOUNTER — Ambulatory Visit: Payer: BC Managed Care – PPO | Admitting: Family Medicine

## 2020-09-26 NOTE — Progress Notes (Signed)
Cardiology Office Note  Date: 06/03/2020   ID: TERI UVA, DOB Aug 09, 1960, MRN [id number]  PCP:  Glenda Chroman, MD  Cardiologist:  No primary care provider on file. Electrophysiologist:  None   Chief Complaint: 3 month follow up  History of Present Illness: Dean Wilson is a 60 y.o. male with a history of hypertensive emergency, acute systolic heart failure, mild mitral regurgitation, nocturnal hypoxia, history of recent CVA, hypokalemia, ischemic cardiomyopathy, hyperlipidemia.  He was admitted to an outside hospital in March 2022 with acute CVA and found to have multiple small acute cortical/subcortical infarcts with posterior right frontal lobe and right parietal occipital lobes.  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  Had an echocardiogram revealing EF of 45%, normal RV, normal bubble study.  G1 DD, normal valves.  Carotid duplex without significant stenosis.  He was admitted to [facility] for acute systolic heart failure.  Received IV diuresis with good response.  Repeat echo demonstrated EF of 40 to 45% with possible LV thrombus.  Follow-up MRI suggested ischemic heart disease with less than 50% LGE of apical and basal lateral and inferior walls.  Was seen by Dr. Haroldine Laws April 17, 2020.  His losartan was changed to [facility].  A Zio patch was placed.  Cardiac catheterization was arranged for further evaluation.  Cardiac catheterization revealed severe stenosis in mid LAD with subsequent DES.  See cardiac catheterization report.  Plan was for DAPT therapy with aspirin and Plavix for at least 6 months.  He was discharged on aspirin 81 mg daily, carvedilol 25 mg p.o. twice daily, Plavix 75 mg p.o. daily.  Entresto 49/51 mg p.o. twice daily, hydralazine 25 mg p.o. 3 times daily.  Potassium chloride 20 mEq p.o. twice daily.  Crestor 20 mg p.o. daily, torsemide 20 mg p.o. daily  He was last here status post PCI with DES to mid LAD.  He denies any  anginal symptoms.  States he does have some mild shortness of breath on moderate activity but otherwise no issues.  He denied any orthostatic symptoms, CVA or TIA-like symptoms, palpitations or arrhythmias, PND, orthopnea.  He was pending receiving a CPAP machine for sleep apnea.  He denied bleeding.  Denied any claudication-like symptoms, DVT or PE-like symptoms.  He was compliant with all of his medications since discharge.  Blood pressure 120/76.  Heart rate of 64.  He denies any issues with his right radial catheterization access site.  Stated he had a couple of episodes of low blood pressures.  Stated 1 blood pressure recently was 86 systolic, however other blood pressures had been higher at [id number] systolic.  Saw Dr Haroldine Laws 09/08/2020. He was improving. Volume status was good. Torsemide was changed to prn, He was continuing spironolactone 25 mg po daily, carvedilol 25 mg po bid, Entresto 97/103 po bid. Jardiance 89mg po daily was added. He was continuing DAPT and statin. Pending CPAP.  He is here for 3 month follow up today. He states he is feeling much better. He states he recently received his CPAP machine and since starting therapy he feels much better. He denies any anginal or exertional symptoms, DOE/SOB, weight gain, or lower extremity edema. Denies any PND or orthopnea. He states he is compliant with all of his medications. He denies any CVA or TIA like symptoms. He has no residual focal neurologic deficits from previous CVA. He states he wants to try increasing his activity to help with overall health.  Past Medical History:  Diagnosis Date   Essential hypertension    GERD (gastroesophageal reflux disease)    PSVT (paroxysmal supraventricular tachycardia) ([place])    Stroke [facility])     Past Surgical History:  Procedure Laterality Date   CORONARY STENT INTERVENTION N/A 05/20/2020   Procedure: CORONARY STENT INTERVENTION;  Surgeon: Burnell Blanks, MD;  Location: [place] [facility];   Service: Cardiovascular;  Laterality: N/A;   RIGHT/LEFT HEART CATH AND CORONARY ANGIOGRAPHY N/A 05/20/2020   Procedure: RIGHT/LEFT HEART CATH AND CORONARY ANGIOGRAPHY;  Surgeon: Jolaine Artist, MD;  Location: [place] [facility];  Service: Cardiovascular;  Laterality: N/A;   UMBILICAL HERNIA REPAIR      Current Outpatient Medications  Medication Sig Dispense Refill   amLODipine (NORVASC) 5 MG tablet Take 1 tablet by mouth daily.     ASPIRIN LOW DOSE 81 MG EC tablet Take 81 mg by mouth daily.     carvedilol (COREG) 25 MG tablet Take 25 mg by mouth 2 (two) times daily.     clopidogrel (PLAVIX) 75 MG tablet Take 75 mg by mouth daily.     DENTA 5000 PLUS 1.1 % CREA dental cream Take 1 application by mouth at bedtime.     hydrALAZINE (APRESOLINE) 25 MG tablet Take 25 mg by mouth 3 (three) times daily.     Multiple Vitamin (MULTIVITAMIN) tablet Take 1 tablet by mouth daily.     pantoprazole (PROTONIX) 40 MG tablet Take 1 tablet (40 mg total) by mouth daily. 30 tablet 11   potassium chloride SA (KLOR-CON) 20 MEQ tablet Take 20 mEq by mouth 2 (two) times daily.     rosuvastatin (CRESTOR) 20 MG tablet Take 1 tablet (20 mg total) by mouth daily. 30 tablet 0   sacubitril-valsartan (ENTRESTO) 49-51 MG Take 1 tablet by mouth 2 (two) times daily. 60 tablet 6   spironolactone (ALDACTONE) 25 MG tablet Take 1 tablet (25 mg total) by mouth daily. 30 tablet 0   torsemide (DEMADEX) 20 MG tablet Take 1 tablet (20 mg total) by mouth daily. 30 tablet 0   No current facility-administered medications for this visit.   Allergies:  Banana, Sunflower oil, and Tomato   Social History: The patient  reports that he quit smoking about 12 years ago. His smoking use included cigarettes. He has a 50.00 pack-year smoking history. He has never used smokeless tobacco. He reports that he does not drink alcohol and does not use drugs.   Family History: The patient's family history is not on file.   ROS:  Please see the  history of present illness. Otherwise, complete review of systems is positive for none.  All other systems are reviewed and negative.   Physical Exam: VS:  BP 120/76   Pulse 64   Ht 6' (1.829 m)   Wt 267 lb 3.2 oz (121.2 kg)   SpO2 97%   BMI 36.24 kg/m , BMI Body mass index is 36.24 kg/m.  Wt Readings from Last 3 Encounters:  06/03/20 267 lb 3.2 oz (121.2 kg)  05/20/20 275 lb (124.7 kg)  05/18/20 276 lb (125.2 kg)    General: Obese patient appears comfortable at rest. Neck: Supple, no elevated JVP or carotid bruits, no thyromegaly. Lungs: Clear to auscultation, nonlabored breathing at rest. Cardiac: Regular rate and rhythm, no S3 or significant systolic murmur, no pericardial rub. Extremities: No pitting edema, distal pulses 2+. Skin: Warm and dry.  Right radial access site clean and dry with 2+ pulses Musculoskeletal: No kyphosis. Neuropsychiatric: Alert  and oriented x3, affect grossly appropriate.  ECG:  EKG May 20, 2020 sinus bradycardia with rate of 58.  Recent Labwork: 05/04/2020: B Natriuretic Peptide 329.1 05/05/2020: ALT 21; AST 18; TSH 4.690 05/06/2020: Magnesium 2.1 05/07/2020: Platelets 223 05/18/2020: BUN 18; Creatinine, Ser 1.32 05/20/2020: Hemoglobin 13.3; Potassium 3.2; Sodium 147  No results found for: CHOL, TRIG, HDL, CHOLHDL, VLDL, LDLCALC, LDLDIRECT  Other Studies Reviewed Today:  Cardiac monitor 06/20/2020 Study Highlights    Patch Wear Time:  4 days and 16 hours (2022-04-13T10:53:27-0400 to 2022-04-18T03:36:31-0400)   1. Sinus rhythm - avg HR of 69 bpm.  2. Rare PACs and PVCs.   Normal monitor.    Glori Bickers, MD  11:21 PM    Echocardiogram 08/25/2020  1. Left ventricular ejection fraction, by estimation, is 55 to 60%. The left ventricle has normal function. Left ventricular endocardial border not optimally defined to evaluate regional wall motion. There is mild left ventricular hypertrophy. Left ventricular diastolic parameters  are consistent with Grade I diastolic dysfunction (impaired relaxation). 2. Right ventricular systolic function is normal. The right ventricular size is normal. Tricuspid regurgitation signal is inadequate for assessing PA pressure. 3. The mitral valve is abnormal. Mild mitral valve regurgitation. No evidence of mitral stenosis. 4. The aortic valve has an indeterminant number of cusps. Aortic valve regurgitation is not visualized. No aortic stenosis is present. 5. Visualized portion of the abdominal aorta appears dilated at 3.5 cm. 6. The inferior vena cava is normal in size with greater than 50% respiratory variability, suggesting right atrial pressure of 3 mmHg. Comparison(s): ECHO 05/2020 with EF 45%, G1DD, no valvular disease, normal RV.      Cardiac catheterization / PCI 05/20/2020 Conclusion    Mid RCA lesion is 40% stenosed. Dist RCA lesion is 75% stenosed with 75% stenosed side branch in RPDA. 2nd Mrg lesion is 100% stenosed. Ost Cx to Prox Cx lesion is 30% stenosed. Dist LAD lesion is 95% stenosed. Ost LAD to Prox LAD lesion is 50% stenosed. Mid LAD lesion is 50% stenosed. 1st Diag lesion is 95% stenosed.   Findings:   Ao = 149/78 (104) LV =  139/10 RA =  3 RV = 19/3 PA = 21/9 (14) PCW = 4 Fick cardiac output/index = 6.2/2.5 PVR = 1.5 WU Ao sat = 99% PA sat = 72%, 76%   Assessment: 1. 3v CAD 2. EF 40-45% 3. Well compensated hemodynamics   Plan/Discussion:   Plan PCI of LAD. Distal RCA borderline significant but does not feed much myocardium. Will treat that lesion medically for now unless he develops angina.    Diagnostic Dominance: Right    Intervention    Conclusion    Prox LAD lesion is 50% stenosed. Mid LAD lesion is 50% stenosed. Dist LAD lesion is 95% stenosed. A drug-eluting stent was successfully placed using a SYNERGY XD 2.75X28. Post intervention, there is a 0% residual stenosis.   1. Severe stenosis mid LAD 2. Successful PTCA/DES  x 1 mid LAD 3. See Dr. Clayborne Dana report from the diagnostic cath for further details of the other vessels   Recommendations: DAPT with ASA and Plavix for at least six months. Same day post PCI discharge  Diagnostic Dominance: Right    Intervention       Echocardiogram 05/05/2020 1.Apical images concerning for LV thrombus. GIven drop in EF and recent CVA, echo contrast performed. On echo images, there is no definite thrombus. There appears to be trabeculation/musculature at the apex, but there is also slow moving  contrast at the true apex. Given drop in EF and abnormal LV features, would consider cardiac MRI.Marland Kitchen Left ventricular ejection fraction, by estimation, is 40 to 45%. The left ventricle has mildly decreased function. The left ventricle demonstrates global hypokinesis. The left ventricular internal cavity size was mildly dilated. There is moderate concentric left ventricular hypertrophy. Left ventricular diastolic parameters are indeterminate. Elevated left ventricular end-diastolic pressure. 2. Right ventricular systolic function is normal. The right ventricular size is normal. 3. A small pericardial effusion is present. 4. The mitral valve is grossly normal. Mild mitral valve regurgitation. 5. The aortic valve is grossly normal. Aortic valve regurgitation is not visualized. 6. The inferior vena cava is normal in size with greater than 50% respiratory variability, suggesting right atrial pressure of 3 mmHg. Comparison(s): Prior images unable to be directly viewed, comparison made by report only.   Assessment and Plan:  1. CAD in native artery   2. Chronic systolic heart failure ([place])   3. Ischemic cardiomyopathy   4. Essential hypertension   5. History of CVA (cerebrovascular accident)   6. Mixed hyperlipidemia    1. CAD in native artery He denies any anginal or exertional symptoms today.  Continue aspirin 81 mg daily, continue Plavix 81 mg daily, continue  carvedilol 25 mg p.o. twice daily  2. Chronic systolic heart failure ([place]) 3.  Ischemic cardiomyopathy Echocardiogram 08/25/2020 showed improved EF of 55 to 60%.  LV endocardial border not optimally defined to evaluate wall motion.  Mild LVH, G1 DD, mild MR.  Improved since echo on 05/2020 with EF 45, G1 DD, no valvular disease, normal RV. Continue carvedilol 25 mg p.o. twice daily.  Continue Entresto 97/103 mg p.o. twice daily.  Continue hydralazine 25 mg p.o. 3 times daily, continue spironolactone 25 mg p.o. daily.  Continue torsemide 20 mg p.o. prn, Jardiance 10 mg po daily  4. Essential hypertension Blood pressure well controlled on current medical therapy with BP of 122/78 today.  Continue hydralazine 25 mg p.o. 3 times daily.  Continue spironolactone 25 mg p.o. daily.  Continue torsemide 20 mg p.o. prn, amlodipine 5 mg po daily  5. History of CVA (cerebrovascular accident) Recent history of CVA.  No noted residual neurological deficits.  Continue aspirin 81 mg daily.  Continue Plavix 81 mg daily.  6. Mixed hyperlipidemia Continue rosuvastatin 20 mg p.o. daily.  Lipid panel 06/29/2020; TC 173, TG 173, HDL 41, LDL 97.  7.OSA He states he received his CPAP recently and has started therapy and states he is amazed at how good he feels since he started.   Medication Adjustments/Labs and Tests Ordered: Current medicines are reviewed at length with the patient today.  Concerns regarding medicines are outlined above.   Disposition: Follow-up with Dr. Harl Bowie or APP 3 months.  Signed, Levell July, NP 06/03/2020 8:59 AM    [facility], [place], [place] [address] Phone: [phone]; Fax: [phone]

## 2020-09-27 ENCOUNTER — Ambulatory Visit (INDEPENDENT_AMBULATORY_CARE_PROVIDER_SITE_OTHER): Payer: BC Managed Care – PPO | Admitting: Family Medicine

## 2020-09-27 ENCOUNTER — Encounter: Payer: Self-pay | Admitting: Family Medicine

## 2020-09-27 VITALS — BP 122/78 | HR 68 | Ht 72.0 in | Wt 260.8 lb

## 2020-09-27 DIAGNOSIS — I10 Essential (primary) hypertension: Secondary | ICD-10-CM

## 2020-09-27 DIAGNOSIS — Z8673 Personal history of transient ischemic attack (TIA), and cerebral infarction without residual deficits: Secondary | ICD-10-CM

## 2020-09-27 DIAGNOSIS — I2510 Atherosclerotic heart disease of native coronary artery without angina pectoris: Secondary | ICD-10-CM | POA: Diagnosis not present

## 2020-09-27 DIAGNOSIS — I5022 Chronic systolic (congestive) heart failure: Secondary | ICD-10-CM

## 2020-09-27 DIAGNOSIS — E782 Mixed hyperlipidemia: Secondary | ICD-10-CM

## 2020-09-27 NOTE — Patient Instructions (Addendum)
Medication Instructions:  Continue all current medications.   Labwork: none  Testing/Procedures: none  Follow-Up: 6 months   Any Other Special Instructions Will Be Listed Below (If Applicable).   If you need a refill on your cardiac medications before your next appointment, please call your pharmacy.  

## 2020-09-28 ENCOUNTER — Encounter: Payer: Self-pay | Admitting: Family Medicine

## 2020-09-29 ENCOUNTER — Telehealth: Payer: Self-pay | Admitting: *Deleted

## 2020-09-29 NOTE — Telephone Encounter (Signed)
-----   Message from Verta Ellen., NP sent at 09/28/2020  6:04 PM EDT ----- Regarding: LDL still elevated. I noticed his LDL was still elevated on 06/29/2020. Can you call him and ask him if he is willing to increase the Crestor to 40 mg po daily . Tell him his bad cholesterol is still above goal of 70. It is 97 . We need to get LDL to below 70 if possible. If so, go ahead and order the increased dose of Crestor 40 mg po daily. Thanks

## 2020-09-29 NOTE — Telephone Encounter (Signed)
Left message to return call 

## 2020-10-07 ENCOUNTER — Emergency Department (HOSPITAL_COMMUNITY): Payer: BC Managed Care – PPO

## 2020-10-07 ENCOUNTER — Emergency Department (HOSPITAL_COMMUNITY)
Admission: EM | Admit: 2020-10-07 | Discharge: 2020-10-07 | Disposition: A | Discharge status: Home / Self Care | Payer: BC Managed Care – PPO | Attending: Emergency Medicine | Admitting: Emergency Medicine

## 2020-10-07 ENCOUNTER — Other Ambulatory Visit: Payer: Self-pay

## 2020-10-07 ENCOUNTER — Encounter (HOSPITAL_COMMUNITY): Payer: Self-pay

## 2020-10-07 DIAGNOSIS — R42 Dizziness and giddiness: Secondary | ICD-10-CM | POA: Insufficient documentation

## 2020-10-07 DIAGNOSIS — I110 Hypertensive heart disease with heart failure: Secondary | ICD-10-CM | POA: Diagnosis not present

## 2020-10-07 DIAGNOSIS — Z87891 Personal history of nicotine dependence: Secondary | ICD-10-CM | POA: Insufficient documentation

## 2020-10-07 DIAGNOSIS — R1110 Vomiting, unspecified: Secondary | ICD-10-CM | POA: Diagnosis not present

## 2020-10-07 DIAGNOSIS — Z79899 Other long term (current) drug therapy: Secondary | ICD-10-CM | POA: Diagnosis not present

## 2020-10-07 DIAGNOSIS — R61 Generalized hyperhidrosis: Secondary | ICD-10-CM | POA: Diagnosis not present

## 2020-10-07 DIAGNOSIS — Z7902 Long term (current) use of antithrombotics/antiplatelets: Secondary | ICD-10-CM | POA: Insufficient documentation

## 2020-10-07 DIAGNOSIS — I5022 Chronic systolic (congestive) heart failure: Secondary | ICD-10-CM | POA: Insufficient documentation

## 2020-10-07 DIAGNOSIS — Z7982 Long term (current) use of aspirin: Secondary | ICD-10-CM | POA: Insufficient documentation

## 2020-10-07 LAB — CBC WITH DIFFERENTIAL/PLATELET
Abs Immature Granulocytes: 0.02 10*3/uL (ref 0.00–0.07)
Basophils Absolute: 0 10*3/uL (ref 0.0–0.1)
Basophils Relative: 1 %
Eosinophils Absolute: 0.4 10*3/uL (ref 0.0–0.5)
Eosinophils Relative: 6 %
HCT: 42.3 % (ref 39.0–52.0)
Hemoglobin: 13.9 g/dL (ref 13.0–17.0)
Immature Granulocytes: 0 %
Lymphocytes Relative: 19 %
Lymphs Abs: 1.4 10*3/uL (ref 0.7–4.0)
MCH: 29.6 pg (ref 26.0–34.0)
MCHC: 32.9 g/dL (ref 30.0–36.0)
MCV: 90 fL (ref 80.0–100.0)
Monocytes Absolute: 0.7 10*3/uL (ref 0.1–1.0)
Monocytes Relative: 10 %
Neutro Abs: 4.8 10*3/uL (ref 1.7–7.7)
Neutrophils Relative %: 64 %
Platelets: 221 10*3/uL (ref 150–400)
RBC: 4.7 MIL/uL (ref 4.22–5.81)
RDW: 14.2 % (ref 11.5–15.5)
WBC: 7.4 10*3/uL (ref 4.0–10.5)
nRBC: 0 % (ref 0.0–0.2)

## 2020-10-07 LAB — COMPREHENSIVE METABOLIC PANEL
ALT: 17 U/L (ref 0–44)
AST: 19 U/L (ref 15–41)
Albumin: 4.3 g/dL (ref 3.5–5.0)
Alkaline Phosphatase: 58 U/L (ref 38–126)
Anion gap: 8 (ref 5–15)
BUN: 26 mg/dL — ABNORMAL HIGH (ref 8–23)
CO2: 23 mmol/L (ref 22–32)
Calcium: 9.1 mg/dL (ref 8.9–10.3)
Chloride: 108 mmol/L (ref 98–111)
Creatinine, Ser: 1.52 mg/dL — ABNORMAL HIGH (ref 0.61–1.24)
GFR, Estimated: 52 mL/min — ABNORMAL LOW (ref >60)
Glucose, Bld: 145 mg/dL — ABNORMAL HIGH (ref 70–99)
Potassium: 4.1 mmol/L (ref 3.5–5.1)
Sodium: 139 mmol/L (ref 135–145)
Total Bilirubin: 0.8 mg/dL (ref 0.3–1.2)
Total Protein: 7.2 g/dL (ref 6.5–8.1)

## 2020-10-07 LAB — TROPONIN I (HIGH SENSITIVITY)
Troponin I (High Sensitivity): 6 ng/L (ref <18)
Troponin I (High Sensitivity): 6 ng/L (ref <18)

## 2020-10-07 IMAGING — CT CT HEAD W/O CM
3 series · 15 of 47 positions shown, 18 images · non-contrast
Comparison: [DATE].

CLINICAL DATA: Dizziness, non-specific

EXAM:
CT HEAD WITHOUT CONTRAST
TECHNIQUE: Contiguous axial images were obtained from the base of the skull
through the vertex without intravenous contrast.

[Series 2: head w o · axial · 0.43mm/px · z∈[-12,+133]mm · 9 of 35 slices shown, 12 images]
[im 3/35  brain]
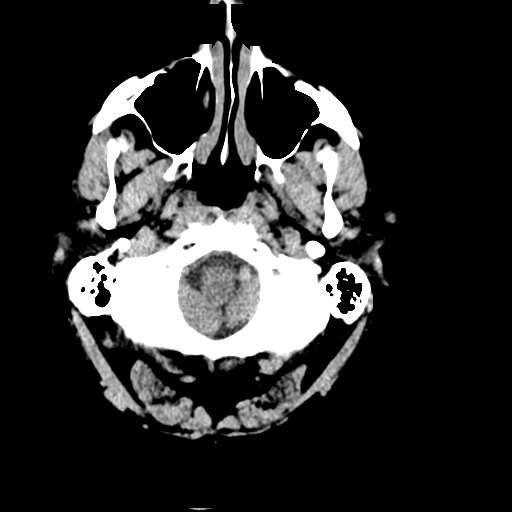
[im 3/35  bone]
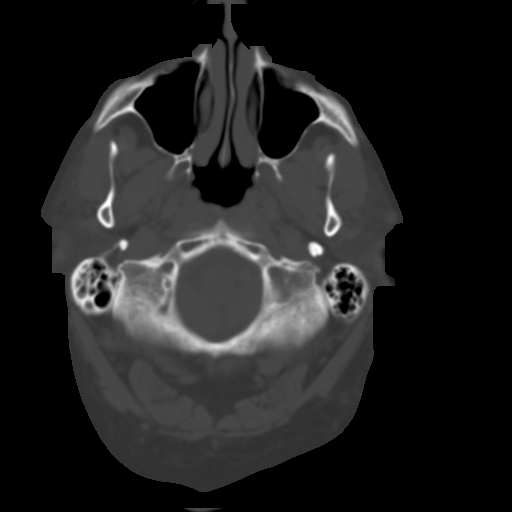
[im 6/35  brain]
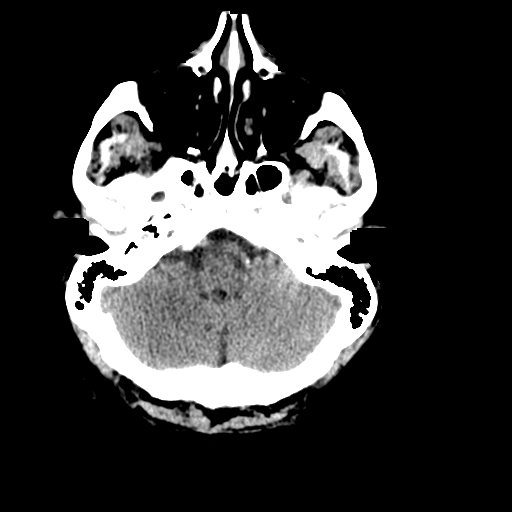
[im 10/35  brain]
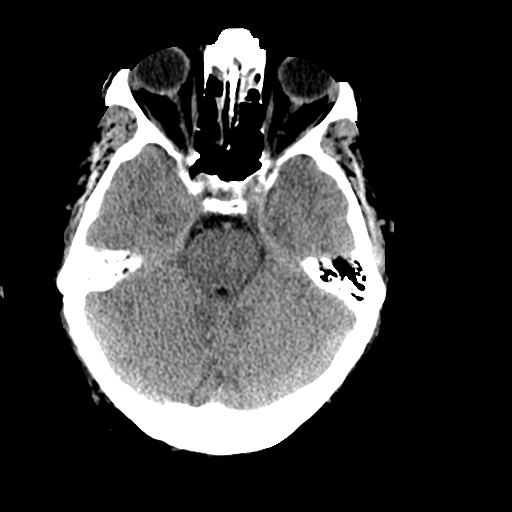
[im 13/35  brain]
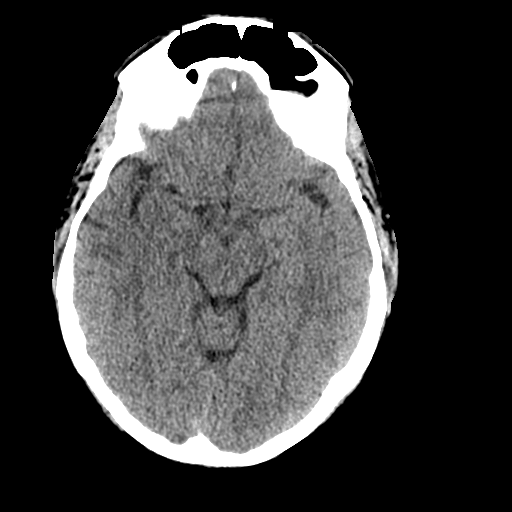
[im 18/35  brain]
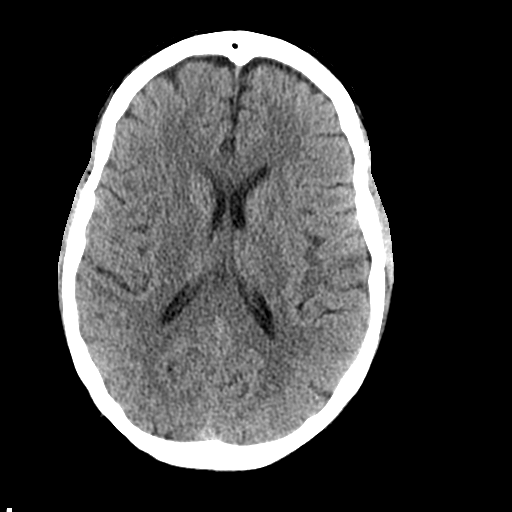
[im 18/35  bone]
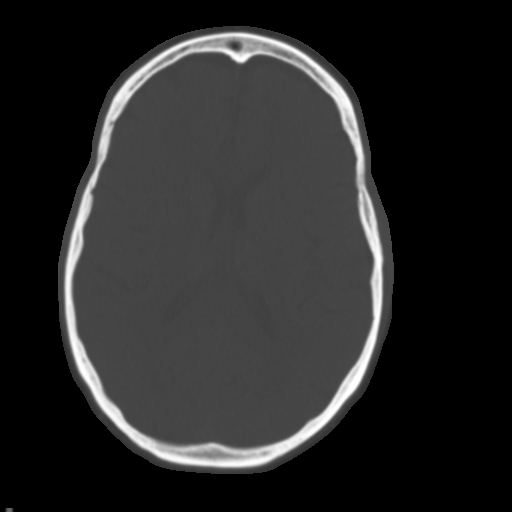
[im 22/35  brain]
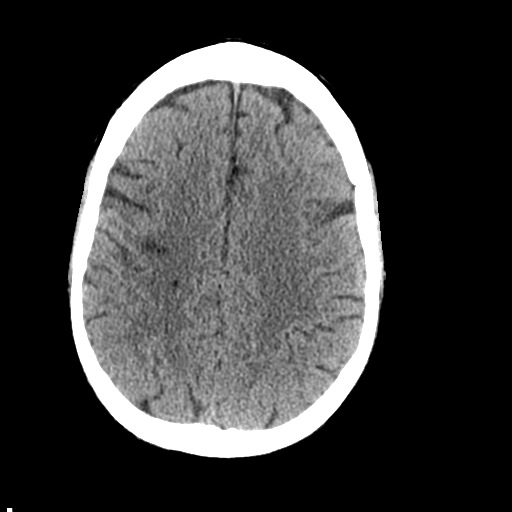
[im 25/35  brain]
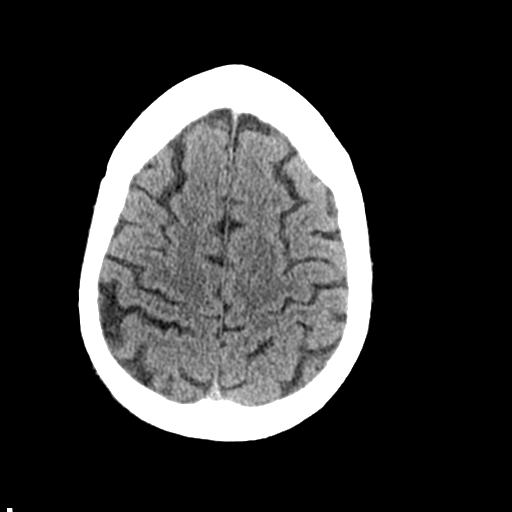
[im 29/35  brain]
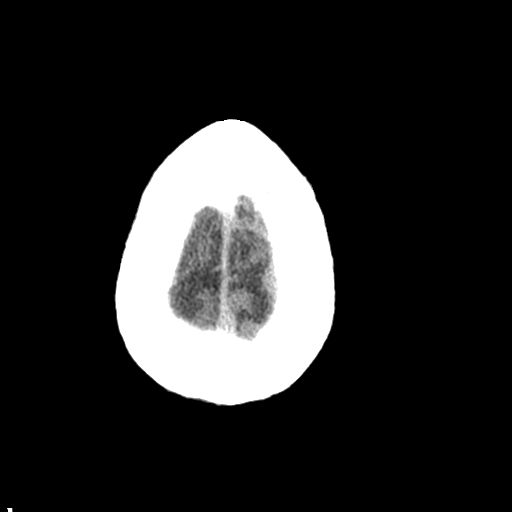
[im 32/35  brain]
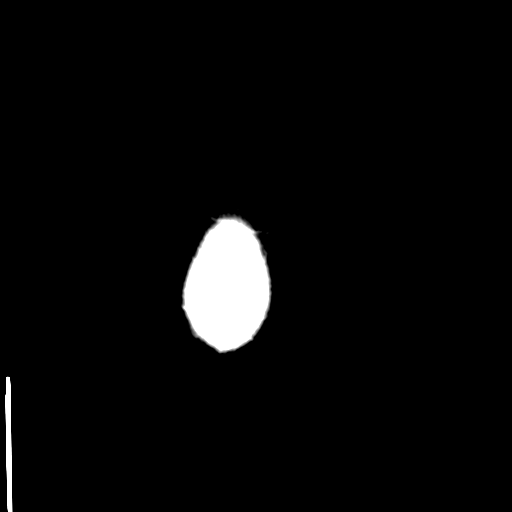
[im 32/35  bone]
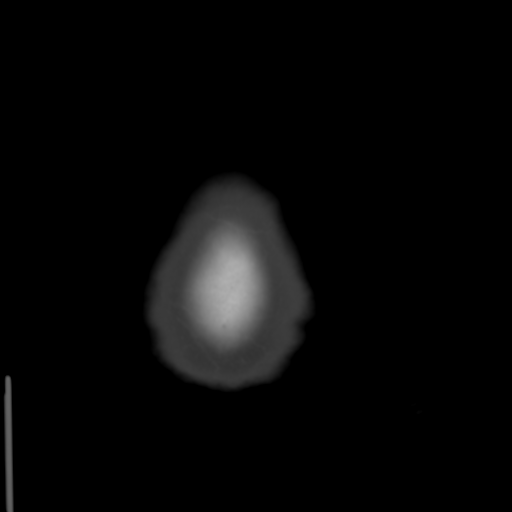

[Series 4: coronal soft · coronal · 0.34mm/px · 3 of 81 slices shown]
[im 27/81  brain]
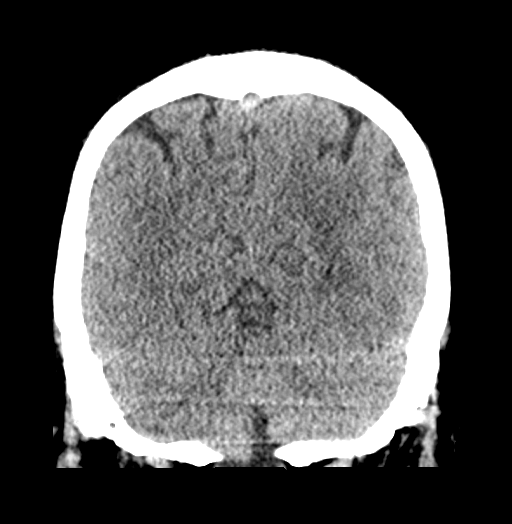
[im 36/81  brain]
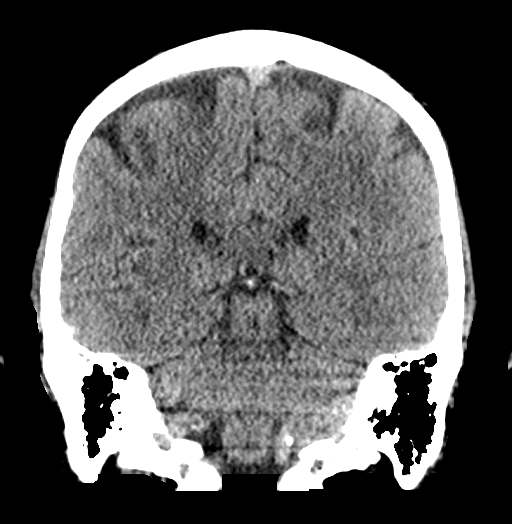
[im 45/81  brain]
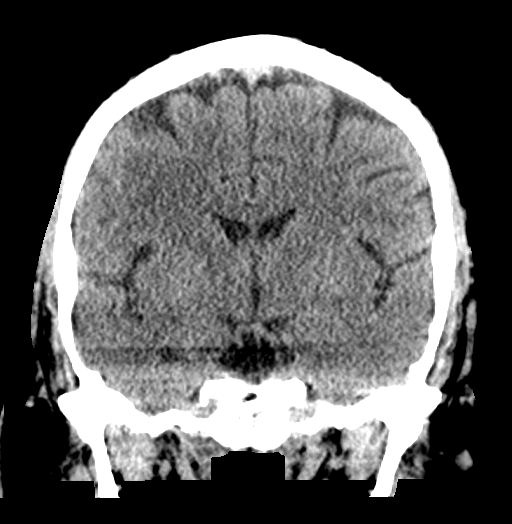

[Series 5: sagittal soft · sagittal · 0.33mm/px · 3 of 63 slices shown]
[im 21/63  brain]
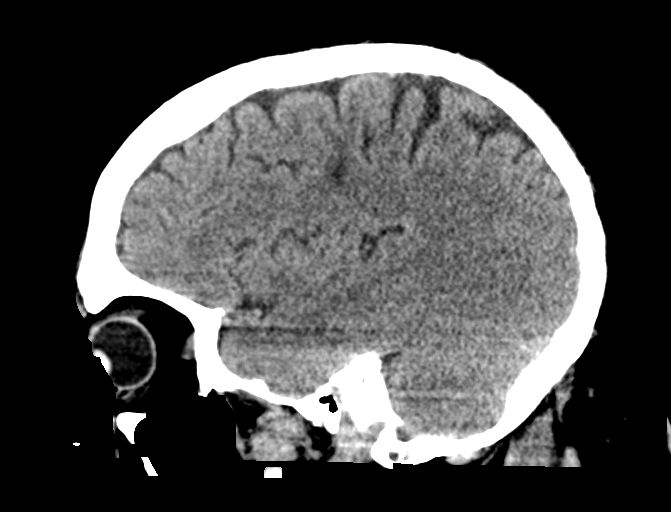
[im 32/63  brain]
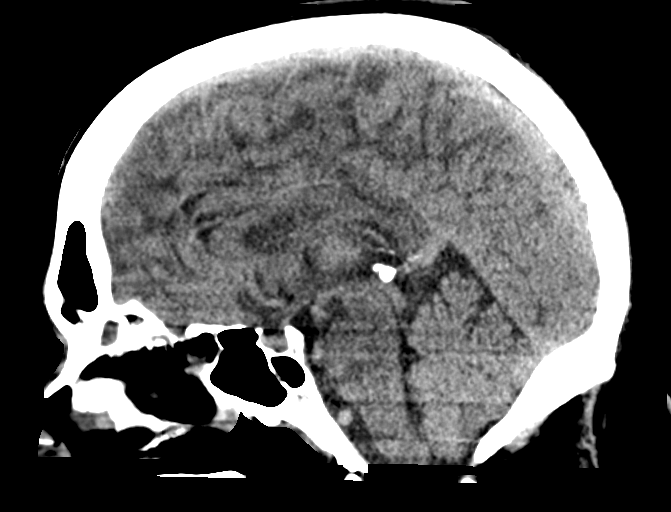
[im 42/63  brain]
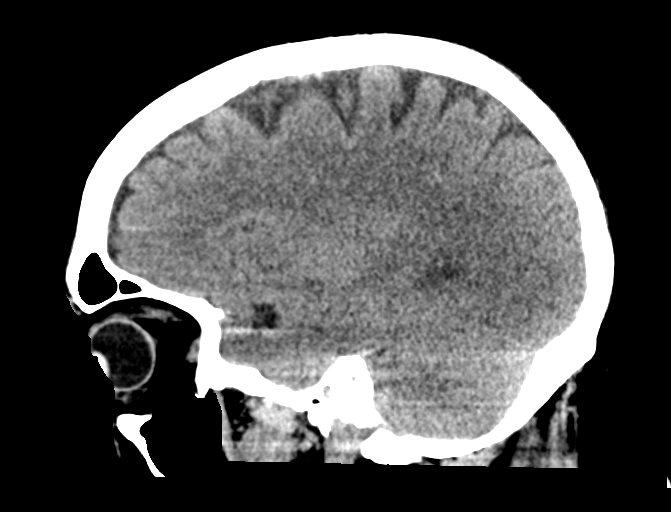

[15 of 47 positions shown; findings below may reference images not displayed]

FINDINGS: Brain: New hypodensity in the right frontal white matter, compatible
with interval infarct. No evidence of acute hemorrhage,
hydrocephalus, extra-axial collection or mass lesion/mass effect.
Remote right cerebellar infarct and multiple small remote right
posterior MCA territory cortical infarcts appear similar, better
characterized on prior MRI. Additional mild white matter
hypodensities, likely the sequela of chronic microvascular ischemic
disease.

Vascular: Calcific intracranial atherosclerosis. No hyperdense
vessel identified.

Skull: No acute fracture.

Sinuses/Orbits: Opacified frontoethmoidal recesses with scattered
ethmoid air cell and inferior frontal sinus mucosal thickening.

Other: Moderate right mastoid effusion.
IMPRESSION: 1. Interval infarct in the right frontal white matter, otherwise age
indeterminate. Please see forthcoming MRI for further evaluation.
2. Remote right cerebellar infarct and multiple small remote right
posterior MCA territory cortical infarcts appear similar, better
characterized on prior MRI.
3. Mild chronic microvascular ischemic disease.
4. Moderate right mastoid effusion.
5. Paranasal sinus disease, detailed above.

## 2020-10-07 IMAGING — MR MR HEAD W/O CM
2 series · 48 of 48 positions shown · non-contrast
Comparison: None.

CLINICAL DATA: Dizziness, non-specific

EXAM:
MRI HEAD WITHOUT CONTRAST
TECHNIQUE: Multiplanar, multiecho pulse sequences of the brain and surrounding
structures were obtained without intravenous contrast.

[Series 6: DWI · axial · 4.0mm · 0.88mm/px · z∈[-55,+79]mm · 25 of 36 slices shown (1 of 2)]
[im 1/36]
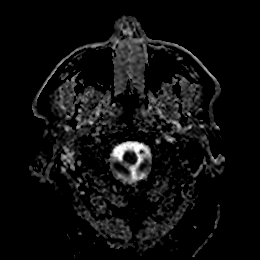
[im 2/36]
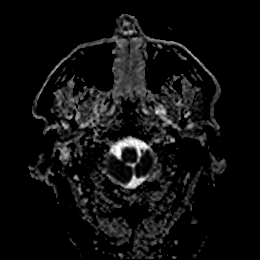
[im 3/36]
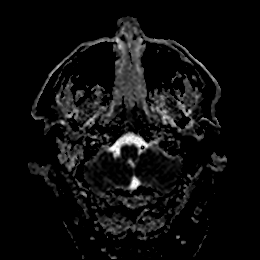
[im 5/36]
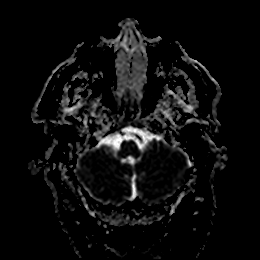
[im 6/36]
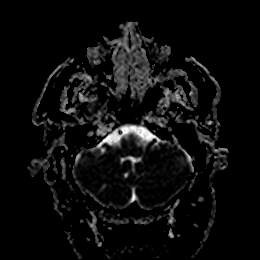
[im 8/36]
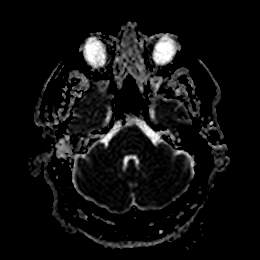
[im 9/36]
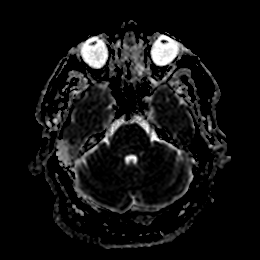
[im 11/36]
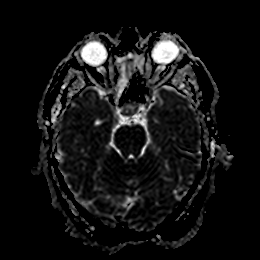
[im 12/36]
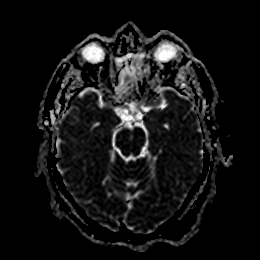
[im 14/36]
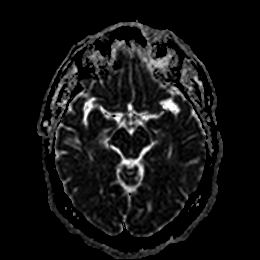
[im 15/36]
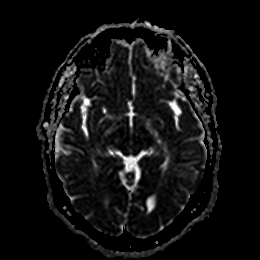
[im 17/36]
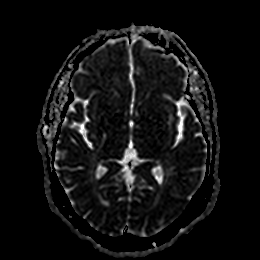
[im 18/36]
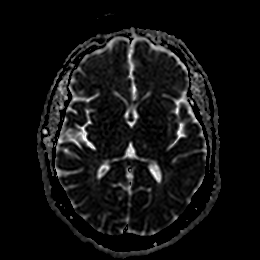
[im 19/36]
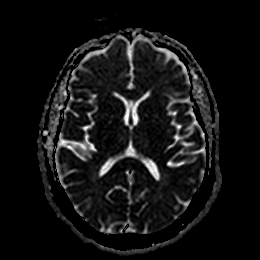
[im 21/36]
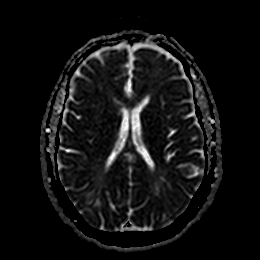
[im 22/36]
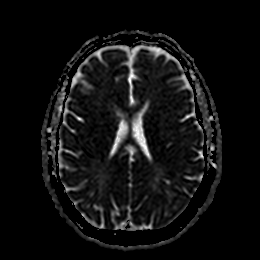
[im 24/36]
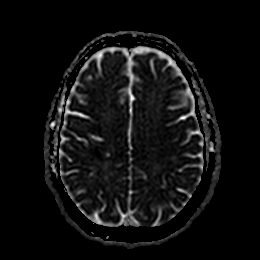
[im 25/36]
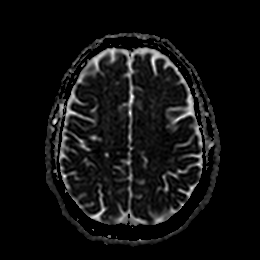
[im 27/36]
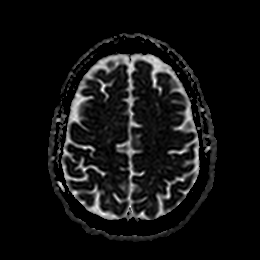
[im 28/36]
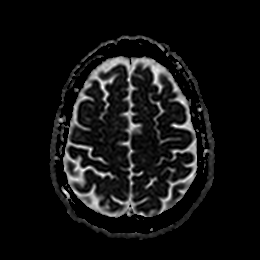
[im 30/36]
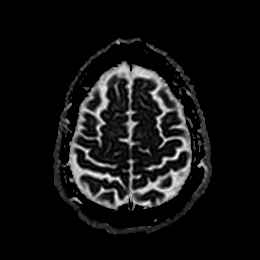
[im 31/36]
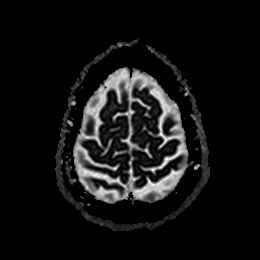
[im 33/36]
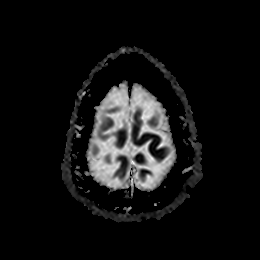
[im 34/36]
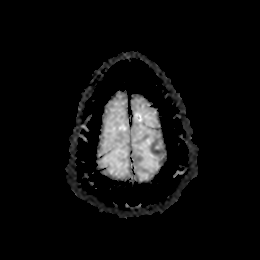
[im 36/36]
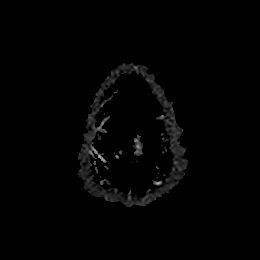

[Series 8: DWI · coronal · 4.0mm · 0.88mm/px · 23 of 32 slices shown (2 of 2)]
[im 1/32]
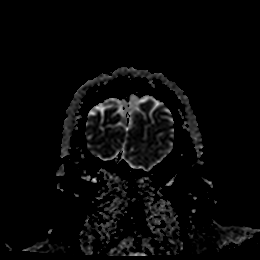
[im 2/32]
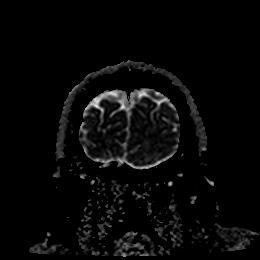
[im 3/32]
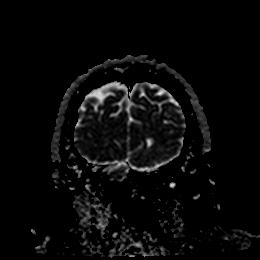
[im 5/32]
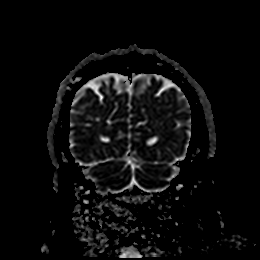
[im 6/32]
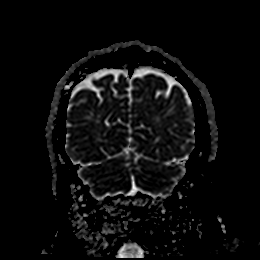
[im 8/32]
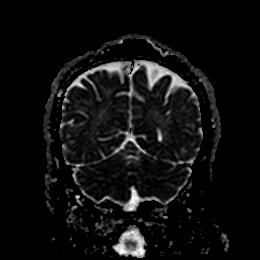
[im 9/32]
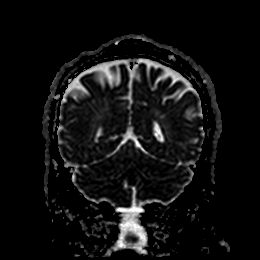
[im 10/32]
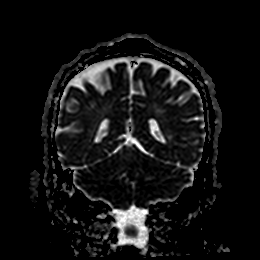
[im 12/32]
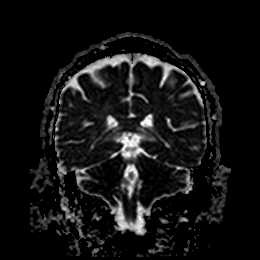
[im 13/32]
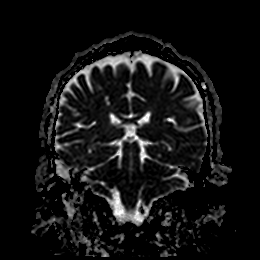
[im 15/32]
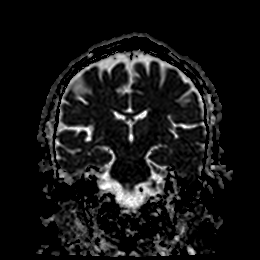
[im 16/32]
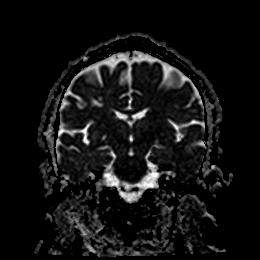
[im 17/32]
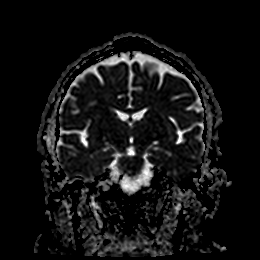
[im 19/32]
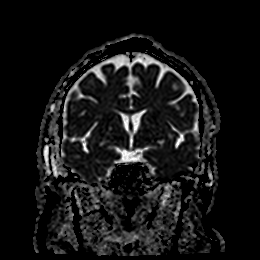
[im 20/32]
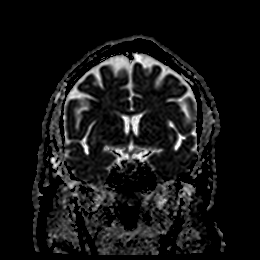
[im 22/32]
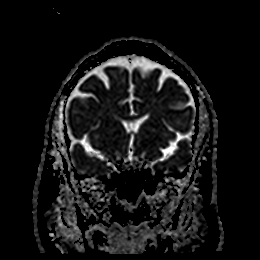
[im 23/32]
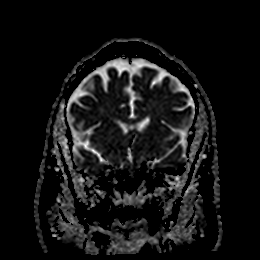
[im 24/32]
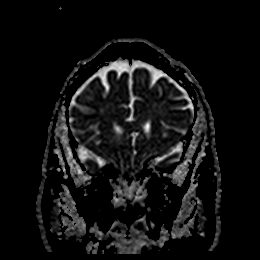
[im 26/32]
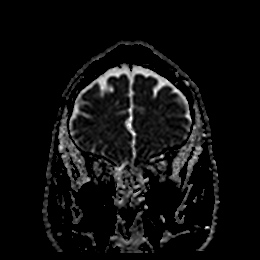
[im 27/32]
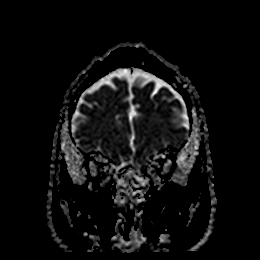
[im 29/32]
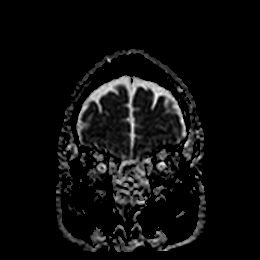
[im 30/32]
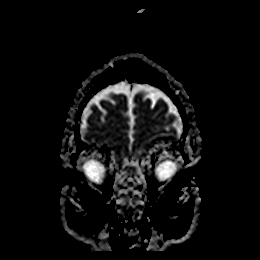
[im 32/32]
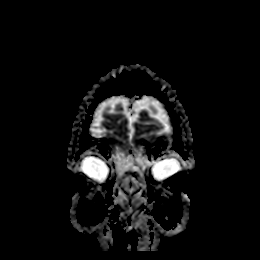

[48 of 48 positions shown; findings below may reference images not displayed]

FINDINGS: Brain: No acute infarction, hemorrhage, hydrocephalus, extra-axial
collection or mass lesion. Remote, but interval infarct in the right
frontal white matter. Evolution of the previously seen small
cortical and subcortical infarcts in the right posterior MCA and PCA
territories with associated encephalomalacia in these regions.
Additional scattered mild T2 hyperintensities in the white matter,
nonspecific but compatible with chronic microvascular ischemic
disease. Similar small remote right cerebellar lacunar infarct.

Vascular: Major arterial flow voids are maintained at the skull
base.

Skull and upper cervical spine: Normal marrow signal.

Sinuses/Orbits: Frontoethmoidal paranasal sinus mucosal thickening.
Unremarkable orbits.

Other: Moderate right mastoid effusion.
IMPRESSION: 1. No acute intracranial abnormality. Specifically, no acute
infarct.
2. Remote, but interval infarct in the right frontal white matter.
3. Evolution of the previously seen small cortical and subcortical
infarcts in the right posterior MCA and PCA territories. Similar
small remote right cerebellar lacunar infarct.
4. Moderate right mastoid effusion.
5. Frontoethmoidal paranasal sinus mucosal thickening.

## 2020-10-07 MED ORDER — MECLIZINE HCL 12.5 MG PO TABS
25.0000 mg | ORAL_TABLET | Freq: Once | ORAL | Status: AC
  Administered 2020-10-07: 25 mg via ORAL
  Filled 2020-10-07: qty 2

## 2020-10-07 MED ORDER — ONDANSETRON HCL 4 MG/2ML IJ SOLN
4.0000 mg | Freq: Once | INTRAMUSCULAR | Status: AC
  Administered 2020-10-07: 4 mg via INTRAVENOUS
  Filled 2020-10-07: qty 2

## 2020-10-07 MED ORDER — SODIUM CHLORIDE 0.9 % IV BOLUS
1000.0000 mL | Freq: Once | INTRAVENOUS | Status: AC
  Administered 2020-10-07: 1000 mL via INTRAVENOUS

## 2020-10-07 MED ORDER — MECLIZINE HCL 25 MG PO TABS: 25.0000 mg | ORAL_TABLET | Freq: Three times a day (TID) | ORAL | 0 refills | Status: DC | PRN

## 2020-10-07 NOTE — ED Notes (Signed)
Pt educated on follow up care and medication pick up.  Pt ambulated to front lobby.

## 2020-10-07 NOTE — ED Triage Notes (Signed)
Pt presents to ED with complaints of dizziness x 1 hour, clammy.  Pt states he feels like he is going to pass out.

## 2020-10-07 NOTE — Discharge Instructions (Addendum)
Call your primary care doctor or specialist as discussed in the next 2-3 days.   Return immediately back to the ER if:  Your symptoms worsen within the next 12-24 hours. You develop new symptoms such as new fevers, persistent vomiting, new pain, shortness of breath, or new weakness or numbness, or if you have any other concerns.  

## 2020-10-07 NOTE — ED Notes (Signed)
Patient transported to MRI 

## 2020-10-07 NOTE — ED Provider Notes (Addendum)
[facility] EMERGENCY DEPARTMENT Provider Note   CSN: [id number] Arrival date & time: 10/07/20  1323     History Chief Complaint  Patient presents with   Dizziness    Dean Wilson is a 60 y.o. male.  Patient presents an episode of dizziness and began to sweat.  The symptoms occurred about an hour prior to arrival.  He states he was just sitting when symptoms happen.  Denies any fall or trauma.  Denies any headache or chest pain.  Denies fevers or cough or diarrhea.  Positive vomiting.      Past Medical History:  Diagnosis Date   Essential hypertension    GERD (gastroesophageal reflux disease)    PSVT (paroxysmal supraventricular tachycardia) ([place])    Stroke [facility])     Patient Active Problem List   Diagnosis Date Noted   History of CVA (cerebrovascular accident) 99991111   Chronic systolic congestive heart failure ([place]) 05/18/2020   Nocturnal hypoxia 99991111   Acute systolic congestive heart failure ([place]) 05/06/2020   Mild mitral regurgitation 05/06/2020   Hypokalemia 05/06/2020   Hypertensive emergency 05/05/2020   Gastritis 01/26/2011    Past Surgical History:  Procedure Laterality Date   CORONARY STENT INTERVENTION N/A 05/20/2020   Procedure: CORONARY STENT INTERVENTION;  Surgeon: Burnell Blanks, MD;  Location: [place] [facility] LAB;  Service: Cardiovascular;  Laterality: N/A;   RIGHT/LEFT HEART CATH AND CORONARY ANGIOGRAPHY N/A 05/20/2020   Procedure: RIGHT/LEFT HEART CATH AND CORONARY ANGIOGRAPHY;  Surgeon: Jolaine Artist, MD;  Location: [place] [facility];  Service: Cardiovascular;  Laterality: N/A;   UMBILICAL HERNIA REPAIR         Family History  Problem Relation Age of Onset   Anesthesia problems Neg Hx    Hypotension Neg Hx    Malignant hyperthermia Neg Hx    Pseudochol deficiency Neg Hx     Social History   Tobacco Use   Smoking status: Former    Packs/day: 2.00    Years: 25.00    Pack years: 50.00    Types: Cigarettes     Quit date: 01/26/2008    Years since quitting: 12.7   Smokeless tobacco: Never  Vaping Use   Vaping Use: Never used  Substance Use Topics   Alcohol use: Never   Drug use: No    Home Medications Prior to Admission medications   Medication Sig Start Date End Date Taking? Authorizing Provider  meclizine (ANTIVERT) 25 MG tablet Take 1 tablet (25 mg total) by mouth 3 (three) times daily as needed for up to 15 doses for dizziness. 10/07/20  Yes Luna Fuse, MD  amLODipine (NORVASC) 5 MG tablet Take 1 tablet by mouth daily. 05/16/20   [provider]  ASPIRIN LOW DOSE 81 MG EC tablet Take 81 mg by mouth daily. 04/29/20   [provider]  carvedilol (COREG) 25 MG tablet Take 25 mg by mouth 2 (two) times daily. 04/20/20   [provider]  clopidogrel (PLAVIX) 75 MG tablet Take 75 mg by mouth daily. 04/29/20   [provider]  DENTA 5000 PLUS 1.1 % CREA dental cream Take 1 application by mouth at bedtime. 04/21/20   [provider]  empagliflozin (JARDIANCE) 10 MG TABS tablet Take 1 tablet (10 mg total) by mouth daily before breakfast. 09/08/20   Bensimhon, Shaune Pascal, MD  hydrALAZINE (APRESOLINE) 25 MG tablet Take 25 mg by mouth 3 (three) times daily.    [provider]  Multiple Vitamin (MULTIVITAMIN)  tablet Take 1 tablet by mouth daily.    [provider]  pantoprazole (PROTONIX) 40 MG tablet Take 1 tablet (40 mg total) by mouth daily. 01/26/11 09/08/21  Rogene Houston, MD  rosuvastatin (CRESTOR) 20 MG tablet Take 1 tablet (20 mg total) by mouth daily. 05/08/20   Gaylan Gerold, DO  sacubitril-valsartan (ENTRESTO) 97-103 MG Take 1 tablet by mouth 2 (two) times daily. 06/29/20   Bensimhon, Shaune Pascal, MD  spironolactone (ALDACTONE) 25 MG tablet Take 0.5 tablets (12.5 mg total) by mouth daily. 09/09/20   Bensimhon, Shaune Pascal, MD  torsemide (DEMADEX) 20 MG tablet Take 1 tablet (20 mg total) by mouth daily as needed. 09/08/20   Bensimhon, Shaune Pascal, MD     Allergies    Banana, Sunflower oil, and Tomato  Review of Systems   Review of Systems  Constitutional:  Negative for fever.  HENT:  Negative for ear pain and sore throat.   Eyes:  Negative for pain.  Respiratory:  Negative for cough.   Cardiovascular:  Negative for chest pain.  Gastrointestinal:  Negative for abdominal pain.  Genitourinary:  Negative for flank pain.  Musculoskeletal:  Negative for back pain.  Skin:  Negative for color change and rash.  Neurological:  Negative for syncope.  All other systems reviewed and are negative.  Physical Exam Updated Vital Signs BP 129/71   Pulse 63   Temp 97.6 F (36.4 C) (Oral)   Resp 15   Ht 6' (1.829 m)   Wt 116.6 kg   SpO2 99%   BMI 34.86 kg/m   Physical Exam Constitutional:      Appearance: He is well-developed.  HENT:     Head: Normocephalic.     Nose: Nose normal.  Eyes:     Extraocular Movements: Extraocular movements intact.  Cardiovascular:     Rate and Rhythm: Normal rate.  Pulmonary:     Effort: Pulmonary effort is normal.  Skin:    Coloration: Skin is not jaundiced.  Neurological:     General: No focal deficit present.     Mental Status: He is alert. Mental status is at baseline.     Comments: Cranial nerves II through XII intact.  5/5 strength all extremities.  Finger-nose heel-to-shin appear intact.  Questionable nystagmus seen on horizontal eye movement to the left.  Gait remains unsteady.    ED Results / Procedures / Treatments   Labs (all labs ordered are listed, but only abnormal results are displayed) Labs Reviewed  COMPREHENSIVE METABOLIC PANEL - Abnormal; Notable for the following components:      Result Value   Glucose, Bld 145 (*)    BUN 26 (*)    Creatinine, Ser 1.52 (*)    GFR, Estimated 52 (*)    All other components within normal limits  CBC WITH DIFFERENTIAL/PLATELET  TROPONIN I (HIGH SENSITIVITY)  TROPONIN I (HIGH SENSITIVITY)    EKG None  Radiology CT Head Wo  Contrast  Result Date: 10/07/2020 CLINICAL DATA:  Dizziness, non-specific EXAM: CT HEAD WITHOUT CONTRAST TECHNIQUE: Contiguous axial images were obtained from the base of the skull through the vertex without intravenous contrast. COMPARISON:  April 27, 2020. FINDINGS: Brain: New hypodensity in the right frontal white matter, compatible with interval infarct. No evidence of acute hemorrhage, hydrocephalus, extra-axial collection or mass lesion/mass effect. Remote right cerebellar infarct and multiple small remote right posterior MCA territory cortical infarcts appear similar, better characterized on prior MRI. Additional mild white matter hypodensities, likely the sequela of  chronic microvascular ischemic disease. Vascular: Calcific intracranial atherosclerosis. No hyperdense vessel identified. Skull: No acute fracture. Sinuses/Orbits: Opacified frontoethmoidal recesses with scattered ethmoid air cell and inferior frontal sinus mucosal thickening. Other: Moderate right mastoid effusion. IMPRESSION: 1. Interval infarct in the right frontal white matter, otherwise age indeterminate. Please see forthcoming MRI for further evaluation. 2. Remote right cerebellar infarct and multiple small remote right posterior MCA territory cortical infarcts appear similar, better characterized on prior MRI. 3. Mild chronic microvascular ischemic disease. 4. Moderate right mastoid effusion. 5. Paranasal sinus disease, detailed above. Electronically Signed   By: Margaretha Sheffield M.D.   On: 10/07/2020 15:44   MR BRAIN WO CONTRAST  Result Date: 10/07/2020 CLINICAL DATA:  Dizziness, non-specific EXAM: MRI HEAD WITHOUT CONTRAST TECHNIQUE: Multiplanar, multiecho pulse sequences of the brain and surrounding structures were obtained without intravenous contrast. COMPARISON:  None. FINDINGS: Brain: No acute infarction, hemorrhage, hydrocephalus, extra-axial collection or mass lesion. Remote, but interval infarct in the right frontal white  matter. Evolution of the previously seen small cortical and subcortical infarcts in the right posterior MCA and PCA territories with associated encephalomalacia in these regions. Additional scattered mild T2 hyperintensities in the white matter, nonspecific but compatible with chronic microvascular ischemic disease. Similar small remote right cerebellar lacunar infarct. Vascular: Major arterial flow voids are maintained at the skull base. Skull and upper cervical spine: Normal marrow signal. Sinuses/Orbits: Frontoethmoidal paranasal sinus mucosal thickening. Unremarkable orbits. Other: Moderate right mastoid effusion. IMPRESSION: 1. No acute intracranial abnormality. Specifically, no acute infarct. 2. Remote, but interval infarct in the right frontal white matter. 3. Evolution of the previously seen small cortical and subcortical infarcts in the right posterior MCA and PCA territories. Similar small remote right cerebellar lacunar infarct. 4. Moderate right mastoid effusion. 5. Frontoethmoidal paranasal sinus mucosal thickening. Electronically Signed   By: Margaretha Sheffield M.D.   On: 10/07/2020 15:53    Procedures Procedures   Medications Ordered in ED Medications  ondansetron (ZOFRAN) injection 4 mg (4 mg Intravenous Given 10/07/20 1411)  sodium chloride 0.9 % bolus 1,000 mL (1,000 mLs Intravenous New Bag/Given 10/07/20 1411)  meclizine (ANTIVERT) tablet 25 mg (25 mg Oral Given 10/07/20 1421)    ED Course  I have reviewed the triage vital signs and the nursing notes.  Pertinent labs & imaging results that were available during my care of the patient were reviewed by me and considered in my medical decision making (see chart for details).    MDM Rules/Calculators/A&P                           Labs are sent which are unremarkable chemistry normal CBC white count normal.  Patient vomiting here in ER, given Zofran and meclizine.  Ancillary imaging studies pursued.  No acute infarct noted, however  remote infarct noted.  Patient is on dual therapy Plavix and aspirin.  He was treated here with his meclizine and Zofran with subsequent resolution of symptoms now has a normal gait.  Will advise outpatient follow-up with neurology within the week, recommending immediate return for worsening symptoms new weakness or any additional concerns.  Final Clinical Impression(s) / ED Diagnoses Final diagnoses:  Dizziness    Rx / DC Orders ED Discharge Orders          Ordered    meclizine (ANTIVERT) 25 MG tablet  3 times daily PRN        10/07/20 1608  Luna Fuse, MD 10/07/20 1555    Luna Fuse, MD 10/07/20 [phone]

## 2020-10-12 ENCOUNTER — Encounter: Payer: Self-pay | Admitting: *Deleted

## 2020-10-12 NOTE — Telephone Encounter (Signed)
Sent patient message via mychart.

## 2020-10-25 ENCOUNTER — Encounter: Payer: Self-pay | Admitting: *Deleted

## 2020-11-01 ENCOUNTER — Other Ambulatory Visit: Payer: Self-pay | Admitting: Family Medicine

## 2020-11-07 ENCOUNTER — Telehealth: Payer: Self-pay | Admitting: Family Medicine

## 2020-11-07 MED ORDER — ROSUVASTATIN CALCIUM 40 MG PO TABS: 40.0000 mg | ORAL_TABLET | Freq: Every day | ORAL | 6 refills | Status: DC

## 2020-11-07 NOTE — Telephone Encounter (Signed)
Notified via detailed voice message - will send new increased dose on the Crestor 40mg  to [facility].    Notified via mychart as well.

## 2020-11-07 NOTE — Telephone Encounter (Signed)
Patient is returning a call to [employer].

## 2020-12-14 ENCOUNTER — Encounter (HOSPITAL_COMMUNITY): Payer: Self-pay | Admitting: Internal Medicine

## 2020-12-14 ENCOUNTER — Ambulatory Visit (HOSPITAL_COMMUNITY)
Admission: RE | Admit: 2020-12-14 | Discharge: 2020-12-14 | Disposition: A | Discharge status: Home / Self Care | Payer: BC Managed Care – PPO | Source: Ambulatory Visit | Attending: Internal Medicine | Admitting: Internal Medicine

## 2020-12-14 VITALS — BP 164/98 | HR 72 | Wt 259.4 lb

## 2020-12-14 DIAGNOSIS — I5022 Chronic systolic (congestive) heart failure: Secondary | ICD-10-CM | POA: Insufficient documentation

## 2020-12-14 DIAGNOSIS — Z8673 Personal history of transient ischemic attack (TIA), and cerebral infarction without residual deficits: Secondary | ICD-10-CM | POA: Insufficient documentation

## 2020-12-14 DIAGNOSIS — I10 Essential (primary) hypertension: Secondary | ICD-10-CM

## 2020-12-14 DIAGNOSIS — I2510 Atherosclerotic heart disease of native coronary artery without angina pectoris: Secondary | ICD-10-CM | POA: Diagnosis not present

## 2020-12-14 DIAGNOSIS — E785 Hyperlipidemia, unspecified: Secondary | ICD-10-CM | POA: Diagnosis not present

## 2020-12-14 DIAGNOSIS — Z87891 Personal history of nicotine dependence: Secondary | ICD-10-CM | POA: Diagnosis not present

## 2020-12-14 DIAGNOSIS — E782 Mixed hyperlipidemia: Secondary | ICD-10-CM

## 2020-12-14 DIAGNOSIS — G4733 Obstructive sleep apnea (adult) (pediatric): Secondary | ICD-10-CM | POA: Diagnosis not present

## 2020-12-14 DIAGNOSIS — Z955 Presence of coronary angioplasty implant and graft: Secondary | ICD-10-CM | POA: Insufficient documentation

## 2020-12-14 DIAGNOSIS — I110 Hypertensive heart disease with heart failure: Secondary | ICD-10-CM | POA: Insufficient documentation

## 2020-12-14 DIAGNOSIS — Z7982 Long term (current) use of aspirin: Secondary | ICD-10-CM | POA: Diagnosis not present

## 2020-12-14 LAB — BASIC METABOLIC PANEL
Anion gap: 10 (ref 5–15)
BUN: 25 mg/dL — ABNORMAL HIGH (ref 8–23)
CO2: 25 mmol/L (ref 22–32)
Calcium: 9.3 mg/dL (ref 8.9–10.3)
Chloride: 101 mmol/L (ref 98–111)
Creatinine, Ser: 1.5 mg/dL — ABNORMAL HIGH (ref 0.61–1.24)
GFR, Estimated: 53 mL/min — ABNORMAL LOW (ref >60)
Glucose, Bld: 90 mg/dL (ref 70–99)
Potassium: 3.7 mmol/L (ref 3.5–5.1)
Sodium: 136 mmol/L (ref 135–145)

## 2020-12-14 LAB — BRAIN NATRIURETIC PEPTIDE: B Natriuretic Peptide: 114.4 pg/mL — ABNORMAL HIGH (ref 0.0–100.0)

## 2020-12-14 NOTE — Patient Instructions (Signed)
Labs done today. We will contact you only if your labs are abnormal.  No other medication changes were made. Please continue all current medications as prescribed.  Please contact our office if your Blood Pressure goes above 140/90, if shortness of breath gets worse or if you have a weight gain of 3 pounds or more  Your physician recommends that you schedule a follow-up appointment in: 8 months with an echo prior to your exam. Please contact our office in June 2023 to schedule a July 2023 appointment.   If you have any questions or concerns before your next appointment please send [place] a message through [place] or call our office at [phone].    TO LEAVE A MESSAGE FOR THE NURSE SELECT OPTION 2, PLEASE LEAVE A MESSAGE INCLUDING: YOUR NAME DATE OF BIRTH CALL BACK NUMBER REASON FOR CALL**this is important as we prioritize the call backs  YOU WILL RECEIVE A CALL BACK THE SAME DAY AS LONG AS YOU CALL BEFORE 4:00 PM   Do the following things EVERYDAY: Weigh yourself in the morning before breakfast. Write it down and keep it in a log. Take your medicines as prescribed Eat low salt foods--Limit salt (sodium) to 2000 mg per day.  Stay as active as you can everyday Limit all fluids for the day to less than 2 liters   At the [facility], you and your health needs are our priority. As part of our continuing mission to provide you with exceptional heart care, we have created designated Provider Care Teams. These Care Teams include your primary Cardiologist (physician) and Advanced Practice Providers (APPs- Physician Assistants and Nurse Practitioners) who all work together to provide you with the care you need, when you need it.   You may see any of the following providers on your designated Care Team at your next follow up: Dr Glori Bickers Dr Haynes Kerns, NP Lyda Jester, [place] Audry Riles, PharmD   Please be sure to bring in all your medications bottles  to every appointment.

## 2020-12-14 NOTE — Progress Notes (Signed)
Medication Samples have been provided to the patient.  Drug name: Jardiance       Strength: 10mg         Qty: 1  LOT: [id number]  Exp.Date: 07/2022  Dosing instructions: 1 tab pb qd  The patient has been instructed regarding the correct time, dose, and frequency of taking this medication, including desired effects and most common side effects.   Linkyn Gobin R Vasili Fok 5:67 PM 12/14/2020

## 2020-12-14 NOTE — Progress Notes (Addendum)
Advanced HF Clinic Note  PCP: Jerene Bears Primary Cardiologist: Dr. Larose Hires, NP HF Cardiologist: Dr. Haroldine Laws  HPI:  Dean Wilson is a 60 y.o.  male  with poorly-controlled HTN, CVA and systolic HF due to iCM.   Admitted at outside hospital  3/22 with acute CVA symptoms found to have multiple small acute cortical/subcortical infarcts within posterior right frontal lobe and right parietooccipital lobes. Also had evidence of small chronic right cerebellar infarct.  During his workup he had an echo with EF 45%, normal RV, normal bubble study, G1DD, normal valves, collapsible IVC.  Carotid duplex without significant stenosis  Admitted to [facility] in 4/22 with HF.  Had a repeat ECHO which demonstrated EF 40-45% and possible LV thrombus with follow up cardiac MRI recommended.  Follow up MRI suggested ischemic heart disease with <50% LGE of the apical and basal lateral and inferior walls. He was started on GDMT and discharged with close follow up.    cMRI 4/22: EF 32%. LG consistent with prior myocardial infarction in the basal to apical lateral wall and mid inferior wall. LGE is <50% transmural in these regions  Seen in [facility] and we arranged cath.  Cath 05/20/20  1. LM ok 2. LAD 50% ostial and mid. 95% distal 3. OM-2 100% 4. RCA small vessel with tandem 75%  Underwent PCI mid to distal LAD.   Ao = 149/78 (104) LV =  139/10 RA =  3 RV = 19/3 PA = 21/9 (14) PCW = 4 Fick cardiac output/index = 6.2/2.5 PVR = 1.5 WU Ao sat = 99% PA sat = 72%, 76%  Zio 5/22: Normal  Had an ECHO 08/25/2020 EF now 55-60%, G1DD, normal RV, mild MR  Here today for HF follow-up. Feeling well. No significant dyspnea. Able to walk a mile without limitation but admits he has not been exercising. Considering joining [employer]. Works in a Advice worker for [employer]. Took one tablet  Torsemide daily this am for an indent around sock line. Does not elevate legs when sitting. Weight up 2  lb from last visit. Admits may be eating more sodium. Blood pressure tends to be controlled at home. Takes all medications as prescribed. More energy after starting CPAP.  SH:  Social History   Socioeconomic History   Marital status: Married    Spouse name: Luciano Cutter   Number of children: 2   Years of education: Not on file   Highest education level: High school graduate  Occupational History   Occupation: Primary school teacher    Employer: [employer]  Tobacco Use   Smoking status: Former    Packs/day: 2.00    Years: 25.00    Pack years: 50.00    Types: Cigarettes    Quit date: 01/26/2008    Years since quitting: 12.8   Smokeless tobacco: Never  Vaping Use   Vaping Use: Never used  Substance and Sexual Activity   Alcohol use: Never   Drug use: No   Sexual activity: Yes    Birth control/protection: None  Other Topics Concern   Not on file  Social History Narrative   Not on file   Social Determinants of Health   Financial Resource Strain: Low Risk    Difficulty of Paying Living Expenses: Not very hard  Food Insecurity: No Food Insecurity   Worried About Running Out of Food in the Last Year: Never true   [place] in the Last Year: Never true  Transportation  Needs: No Transportation Needs   Lack of Transportation (Medical): No   Lack of Transportation (Non-Medical): No  Physical Activity: Not on file  Stress: Not on file  Social Connections: Not on file  Intimate Partner Violence: Not on file    FH:  Family History  Problem Relation Age of Onset   Anesthesia problems Neg Hx    Hypotension Neg Hx    Malignant hyperthermia Neg Hx    Pseudochol deficiency Neg Hx     Past Medical History:  Diagnosis Date   Essential hypertension    GERD (gastroesophageal reflux disease)    PSVT (paroxysmal supraventricular tachycardia) (HCC)    Stroke (HCC)     Current Outpatient Medications  Medication Sig Dispense Refill   amLODipine (NORVASC) 5 MG tablet Take 1  tablet by mouth daily.     ASPIRIN LOW DOSE 81 MG EC tablet Take 81 mg by mouth daily.     carvedilol (COREG) 25 MG tablet Take 25 mg by mouth 2 (two) times daily.     clopidogrel (PLAVIX) 75 MG tablet Take 75 mg by mouth daily.     DENTA 5000 PLUS 1.1 % CREA dental cream Take 1 application by mouth at bedtime.     empagliflozin (JARDIANCE) 10 MG TABS tablet Take 1 tablet (10 mg total) by mouth daily before breakfast. 30 tablet 11   hydrALAZINE (APRESOLINE) 25 MG tablet Take 25 mg by mouth 3 (three) times daily.     meclizine (ANTIVERT) 25 MG tablet Take 1 tablet (25 mg total) by mouth 3 (three) times daily as needed for up to 15 doses for dizziness. 15 tablet 0   Multiple Vitamin (MULTIVITAMIN) tablet Take 1 tablet by mouth daily.     pantoprazole (PROTONIX) 40 MG tablet Take 1 tablet (40 mg total) by mouth daily. 30 tablet 11   rosuvastatin (CRESTOR) 40 MG tablet Take 1 tablet (40 mg total) by mouth daily. 30 tablet 6   sacubitril-valsartan (ENTRESTO) 97-103 MG Take 1 tablet by mouth 2 (two) times daily. 180 tablet 3   spironolactone (ALDACTONE) 25 MG tablet TAKE ONE TABLET BY MOUTH ONCE DAILY 90 tablet 1   torsemide (DEMADEX) 20 MG tablet Take 1 tablet (20 mg total) by mouth daily as needed. 90 tablet 3   No current facility-administered medications for this encounter.    Vitals:   12/14/20 1454  BP: (!) 164/98  Pulse: 72  SpO2: 97%  Weight: 117.7 kg (259 lb 6.4 oz)    PHYSICAL EXAM: General:  Well appearing. No resp difficulty HEENT: normal Neck: no JVD. Carotids 2+ bilat; no bruits. No lymphadenopathy or thryomegaly appreciated. Cor: PMI nondisplaced. Regular rate & rhythm. No rubs, gallops or murmurs. Lungs: clear Abdomen: soft, nontender, nondistended, obese. No hepatosplenomegaly. No bruits or masses. Good bowel sounds. Extremities: no cyanosis, clubbing, rash, mild edema around sock line Neuro: alert & orientedx3, cranial nerves grossly intact. moves all 4 extremities w/o  difficulty. Affect pleasant    ASSESSMENT & PLAN:  1. Chronic Systolic CHF with recovered LV function/iCM -ECHO 05/2020 with EF 45%, G1DD, no valvular disease, normal RV -Cardiac MRI 05/2020 EF 32% with <50% subendocardial LGE of the apical and basal lateral and inferior walls suggestive of prior infarction and viable myocardium - cath 4/22 EF 40-45%. 3v CAD s/p DES to LAD - ECHO 08/25/2020 EF now 55-60%, G1DD, normal RV, mild MR - NYHA Class II symptoms. Encouraged regular exercise.  - Volume status ok today. Continue with Torsemide PRN.  -  Elevate legs when sitting. - Continue spiro 25mg  - Continue carvedilol 25mg  BID  - Continue Entresto 97/103 bid - Continue Jardiance. Provided several days worth of samples to get him through until mail order refill arrives. - BMP, BNP today  2. CAD - cath 4/22 EF 40-45%. 3v CAD s/p DES to LAD - no s/s angina  - Continue DAPT and statin - Hgb stable on labs 09/02  3. Hx of CVA -MRI 04/2020 cortical/subcortical infarcts within posterior right frontal lobe and right parietooccipital lobes -no afib/flutter on zio -Suspected embolic   4. OSA - Doing well with CPAP  5. Hyperlipidemia - Managed by primary cardiology team.  - Rosuvastatin recently increased to 40 mg daily  6. HTN - BP elevated today.  - Has been controlled at office visits and on home monitoring - No changes today. He will call if elevated at home. - Limit sodium. Increase exercise.   F/u: 8 months with Dr. Haroldine Laws, same day echo  [facility], Riad Wagley N, PA-C  3:20 PM

## 2020-12-20 ENCOUNTER — Other Ambulatory Visit (HOSPITAL_COMMUNITY): Payer: Self-pay

## 2020-12-20 MED ORDER — ENTRESTO 97-103 MG PO TABS: 1.0000 | ORAL_TABLET | Freq: Two times a day (BID) | ORAL | 3 refills | Status: DC

## 2020-12-23 ENCOUNTER — Other Ambulatory Visit (HOSPITAL_COMMUNITY): Payer: Self-pay

## 2020-12-23 NOTE — Telephone Encounter (Signed)
Opened in error

## 2021-05-03 ENCOUNTER — Other Ambulatory Visit: Payer: Self-pay

## 2021-05-03 MED ORDER — SPIRONOLACTONE 25 MG PO TABS: 25.0000 mg | ORAL_TABLET | Freq: Every day | ORAL | 1 refills | Status: DC

## 2021-08-05 ENCOUNTER — Encounter: Payer: Self-pay | Admitting: Cardiology

## 2021-08-05 NOTE — Progress Notes (Unsigned)
Cardiology Office Note  Date: 08/09/2021   ID: Dean Wilson, DOB [birth year], MRN [id number]  PCP:  Glenda Chroman, MD  Cardiologist:  None Electrophysiologist:  None   Chief Complaint  Patient presents with   Cardiac follow-up    History of Present Illness: Dean Wilson is a 61 y.o. male last seen in November 2022 by Dr. Haroldine Laws.  This is our first meeting in the office, I reviewed his records and updated the chart.  He has a history of multiple small cortical/subcortical infarcts diagnosed in March 2022 at which point LVEF was 45%.  Cardiac MRI was consistent with ischemic heart disease and he ultimately underwent cardiac catheterization with placement of DES to the mid LAD in April 2022.  Subsequently on medical therapy LVEF normalized.  He is here today with his wife for a follow-up visit.  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  Reports NYHA class II dyspnea, no palpitations or angina.  He has been going to [employer] a few days a week to either walk on the treadmill or use a stationary bicycle, feels like his stamina has been better with this.  He would like to lose some more weight though.  I reviewed his medications which are noted below.  No obvious intolerances.  Requesting most recent lab work from Dr. Woody Seller.  I personally viewed his ECG today which shows normal sinus rhythm.  His last echocardiogram was in July 2022 at which point LVEF was 55 to 60% range.  Past Medical History:  Diagnosis Date   CAD (coronary artery disease)    DES to mid LAD April 2022   Essential hypertension    GERD (gastroesophageal reflux disease)    History of cardiomyopathy    OSA on CPAP    PSVT (paroxysmal supraventricular tachycardia) ([place])    Stroke [facility])     Past Surgical History:  Procedure Laterality Date   CORONARY STENT INTERVENTION N/A 05/20/2020   Procedure: CORONARY STENT INTERVENTION;  Surgeon: Burnell Blanks, MD;  Location: [place] [facility];  Service: Cardiovascular;  Laterality: N/A;   RIGHT/LEFT HEART CATH AND CORONARY ANGIOGRAPHY N/A 05/20/2020   Procedure: RIGHT/LEFT HEART CATH AND CORONARY ANGIOGRAPHY;  Surgeon: Jolaine Artist, MD;  Location: [place] [facility];  Service: Cardiovascular;  Laterality: N/A;   UMBILICAL HERNIA REPAIR      Current Outpatient Medications  Medication Sig Dispense Refill   amLODipine (NORVASC) 5 MG tablet Take 1 tablet by mouth daily.     ASPIRIN LOW DOSE 81 MG EC tablet Take 81 mg by mouth daily.     carvedilol (COREG) 25 MG tablet Take 25 mg by mouth 2 (two) times daily.     clopidogrel (PLAVIX) 75 MG tablet Take 75 mg by mouth daily.     DENTA 5000 PLUS 1.1 % CREA dental cream Take 1 application by mouth at bedtime.     empagliflozin (JARDIANCE) 10 MG TABS tablet Take 1 tablet (10 mg total) by mouth daily before breakfast. 30 tablet 11   hydrALAZINE (APRESOLINE) 25 MG tablet Take 25 mg by mouth 3 (three) times daily.     meclizine (ANTIVERT) 25 MG tablet Take 1 tablet (25 mg total) by mouth 3 (three) times daily as needed for up to 15 doses for dizziness. 15 tablet 0   Multiple Vitamin (MULTIVITAMIN) tablet Take 1 tablet by mouth daily.     pantoprazole (PROTONIX) 40 MG tablet Take 1 tablet (  40 mg total) by mouth daily. 30 tablet 11   rosuvastatin (CRESTOR) 40 MG tablet Take 1 tablet (40 mg total) by mouth daily. 30 tablet 6   sacubitril-valsartan (ENTRESTO) 97-103 MG Take 1 tablet by mouth 2 (two) times daily. 180 tablet 3   spironolactone (ALDACTONE) 25 MG tablet Take 1 tablet (25 mg total) by mouth daily. 90 tablet 1   torsemide (DEMADEX) 20 MG tablet Take 1 tablet (20 mg total) by mouth daily as needed. 90 tablet 3   No current facility-administered medications for this visit.   Allergies:  Banana, Sunflower oil, and Tomato   ROS:  No palpitations or syncope.  Physical Exam: VS:  BP (!) 140/92   Pulse 63   Ht 6' (1.829 m)   Wt 259 lb 12.8 oz (117.8 kg)   SpO2  95%   BMI 35.24 kg/m , BMI Body mass index is 35.24 kg/m.  Wt Readings from Last 3 Encounters:  08/09/21 259 lb 12.8 oz (117.8 kg)  12/14/20 259 lb 6.4 oz (117.7 kg)  10/07/20 257 lb (116.6 kg)    General: Patient appears comfortable at rest. HEENT: Conjunctiva and lids normal, oropharynx clear. Neck: Supple, no elevated JVP or carotid bruits, no thyromegaly. Lungs: Clear to auscultation, nonlabored breathing at rest. Cardiac: Regular rate and rhythm, no S3 or significant systolic murmur, no pericardial rub. Abdomen: Soft, nontender, no hepatomegaly, bowel sounds present, no guarding or rebound. Extremities: No pitting edema.  ECG:  An ECG dated 09/08/2020 was personally reviewed today and demonstrated:  Sinus rhythm.  Recent Labwork: 10/07/2020: ALT 17; AST 19; Hemoglobin 13.9; Platelets 221 12/14/2020: B Natriuretic Peptide 114.4; BUN 25; Creatinine, Ser 1.50; Potassium 3.7; Sodium 136     Component Value Date/Time   CHOL 173 06/29/2020 1557   TRIG 173 (H) 06/29/2020 1557   HDL 41 06/29/2020 1557   CHOLHDL 4.2 06/29/2020 1557   VLDL 35 06/29/2020 1557   LDLCALC 97 06/29/2020 1557    Other Studies Reviewed Today:  Cardiac catheterization 05/20/2020: Mid RCA lesion is 40% stenosed. Dist RCA lesion is 75% stenosed with 75% stenosed side branch in RPDA. 2nd Mrg lesion is 100% stenosed. Ost Cx to Prox Cx lesion is 30% stenosed. Dist LAD lesion is 95% stenosed. Ost LAD to Prox LAD lesion is 50% stenosed. Mid LAD lesion is 50% stenosed. 1st Diag lesion is 95% stenosed.   Findings:   Ao = 149/78 (104) LV =  139/10 RA =  3 RV = 19/3 PA = 21/9 (14) PCW = 4 Fick cardiac output/index = 6.2/2.5 PVR = 1.5 WU Ao sat = 99% PA sat = 72%, 76%   Assessment: 1. 3v CAD 2. EF 40-45% 3. Well compensated hemodynamics   Plan/Discussion:   Plan PCI of LAD. Distal RCA borderline significant but does not feed much myocardium. Will treat that lesion medically for now unless he  develops angina.   PCI 05/20/2020: Prox LAD lesion is 50% stenosed. Mid LAD lesion is 50% stenosed. Dist LAD lesion is 95% stenosed. A drug-eluting stent was successfully placed using a SYNERGY XD 2.75X28. Post intervention, there is a 0% residual stenosis.   1. Severe stenosis mid LAD 2. Successful PTCA/DES x 1 mid LAD 3. See Dr. Clayborne Dana report from the diagnostic cath for further details of the other vessels  Echocardiogram 08/25/2020:  1. Left ventricular ejection fraction, by estimation, is 55 to 60%. The  left ventricle has normal function. Left ventricular endocardial border  not optimally defined to evaluate  regional wall motion. There is mild left  ventricular hypertrophy. Left  ventricular diastolic parameters are consistent with Grade I diastolic  dysfunction (impaired relaxation).   2. Right ventricular systolic function is normal. The right ventricular  size is normal. Tricuspid regurgitation signal is inadequate for assessing  PA pressure.   3. The mitral valve is abnormal. Mild mitral valve regurgitation. No  evidence of mitral stenosis.   4. The aortic valve has an indeterminant number of cusps. Aortic valve  regurgitation is not visualized. No aortic stenosis is present.   5. Visualized portion of the abdominal aorta appears dilated at 3.5 cm.   6. The inferior vena cava is normal in size with greater than 50%  respiratory variability, suggesting right atrial pressure of 3 mmHg.   Assessment and Plan:  1.  HFrecEF, LVEF 55 to 60% by echocardiogram in July 2022.  I reviewed his prior work-up.  He reports NYHA class II symptoms.  Encouraged regular exercise plan and weight loss if possible.  He is on a good regimen including Coreg, Jardiance, Entresto, Aldactone, and Demadex as needed.  Check follow-up echocardiogram to ensure stability.  Requesting interval lab work from PCP.  2.  CAD status post DES to the LAD in April 2022.  No active angina symptoms.  Continue  antiplatelet regimen, Jardiance, beta-blocker, and statin.  3.  Mixed hyperlipidemia, on Crestor 40 mg daily.  Requesting interval lab work from PCP.  LDL was 97 in May of last year.  4.  History of stroke by brain MRI in March 2022.  No obvious atrial arrhythmias by subsequent ZIO.  He is on dual antiplatelet therapy.  5.  OSA on CPAP.  Medication Adjustments/Labs and Tests Ordered: Current medicines are reviewed at length with the patient today.  Concerns regarding medicines are outlined above.   Tests Ordered: Orders Placed This Encounter  Procedures   EKG 12-Lead   ECHOCARDIOGRAM COMPLETE    Medication Changes: No orders of the defined types were placed in this encounter.   Disposition:  Follow up  6 months.  Signed, Satira Sark, MD, [facility] 08/09/2021 11:05 AM    [facility] [place], [place], [place] [address] Phone: [phone]; Fax: [phone]

## 2021-08-09 ENCOUNTER — Ambulatory Visit: Payer: BC Managed Care – PPO | Admitting: Cardiology

## 2021-08-09 ENCOUNTER — Encounter: Payer: Self-pay | Admitting: *Deleted

## 2021-08-09 ENCOUNTER — Encounter: Payer: Self-pay | Admitting: Cardiology

## 2021-08-09 VITALS — BP 140/92 | HR 63 | Ht 72.0 in | Wt 259.8 lb

## 2021-08-09 DIAGNOSIS — Z8679 Personal history of other diseases of the circulatory system: Secondary | ICD-10-CM | POA: Diagnosis not present

## 2021-08-09 DIAGNOSIS — E782 Mixed hyperlipidemia: Secondary | ICD-10-CM

## 2021-08-09 DIAGNOSIS — I25119 Atherosclerotic heart disease of native coronary artery with unspecified angina pectoris: Secondary | ICD-10-CM | POA: Diagnosis not present

## 2021-08-09 DIAGNOSIS — Z8673 Personal history of transient ischemic attack (TIA), and cerebral infarction without residual deficits: Secondary | ICD-10-CM | POA: Diagnosis not present

## 2021-08-09 NOTE — Patient Instructions (Addendum)

## 2021-08-14 ENCOUNTER — Ambulatory Visit (INDEPENDENT_AMBULATORY_CARE_PROVIDER_SITE_OTHER): Payer: BC Managed Care – PPO

## 2021-08-14 DIAGNOSIS — I25119 Atherosclerotic heart disease of native coronary artery with unspecified angina pectoris: Secondary | ICD-10-CM

## 2021-08-14 LAB — ECHOCARDIOGRAM COMPLETE
AR max vel: 2.36 cm2
AV Area VTI: 2.36 cm2
AV Area mean vel: 2.27 cm2
AV Mean grad: 4.6 mmHg
AV Peak grad: 9.6 mmHg
Ao pk vel: 1.55 m/s
Area-P 1/2: 2.71 cm2
Calc EF: 62.7 %
MV M vel: 4.63 m/s
MV Peak grad: 85.6 mmHg
S' Lateral: 3.08 cm
Single Plane A2C EF: 61.7 %
Single Plane A4C EF: 64.1 %

## 2021-08-17 ENCOUNTER — Telehealth: Payer: Self-pay | Admitting: *Deleted

## 2021-08-17 MED ORDER — REPATHA 140 MG/ML ~~LOC~~ SOSY: 140.0000 mg | PREFILLED_SYRINGE | SUBCUTANEOUS | 6 refills | Status: DC

## 2021-08-17 NOTE — Telephone Encounter (Signed)
-----   Message from Satira Sark, MD sent at 08/15/2021  7:25 PM EDT ----- Results reviewed. LDL 99 per review of PCP lab work. With vascular disease goal should be 50-60 and he is already on Crestor 40 mg daily. Please ask lipid clinic to review his candidacy and coverage for a PCSK9i.

## 2021-08-17 NOTE — Telephone Encounter (Signed)
Patient informed and verbalized understanding of plan. Repatha sent to [facility]

## 2021-08-17 NOTE — Telephone Encounter (Signed)
-----   Message from Satira Sark, MD sent at 08/16/2021  8:00 PM EDT ----- Regarding: RE: Per [place]--Please ask lipid clinic to review his candidacy and coverage for a PCSK9i Let's see if he would be willing to start [place]. ----- Message ----- From: Merlene Laughter, RN Sent: 08/16/2021   3:48 PM EDT To: Satira Sark, MD Subject: FW: Per [place]--Please ask lipid clinic to#   ----- Message ----- From: Rollen Sox, [facility] Sent: 08/16/2021   3:46 PM EDT To: Merlene Laughter, RN Subject: RE: Per [place]--Please ask lipid clinic to#  Yes, looks like he should qualify for Repatha  ----- Message ----- From: Merlene Laughter, RN Sent: 08/16/2021   3:26 PM EDT To: [facility] Subject: Per [place]--Please ask lipid clinic to rev#

## 2021-08-21 ENCOUNTER — Encounter: Payer: Self-pay | Admitting: *Deleted

## 2021-08-21 NOTE — Progress Notes (Signed)
Request Reference Number: [id number]. REPATHA INJ '140MG'$ /ML is approved through 02/17/2022. Your patient may now fill this prescription and it will be covered.

## 2021-12-04 ENCOUNTER — Telehealth: Payer: Self-pay | Admitting: Cardiology

## 2021-12-04 MED ORDER — REPATHA 140 MG/ML ~~LOC~~ SOSY: 140.0000 mg | PREFILLED_SYRINGE | SUBCUTANEOUS | 3 refills | Status: DC

## 2021-12-04 NOTE — Telephone Encounter (Signed)
Pt c/o medication issue:  1. Name of Medication: Evolocumab (REPATHA) 140 MG/ML SOSY  2. How are you currently taking this medication (dosage and times per day)? As prescribed   3. Are you having a reaction (difficulty breathing--STAT)? No   4. What is your medication issue? Pt states that his insurance is requesting him switch to [facility] and he is requesting this RX be sent there.

## 2022-01-24 ENCOUNTER — Telehealth: Payer: Self-pay | Admitting: *Deleted

## 2022-01-24 DIAGNOSIS — Z79899 Other long term (current) drug therapy: Secondary | ICD-10-CM

## 2022-01-24 DIAGNOSIS — E782 Mixed hyperlipidemia: Secondary | ICD-10-CM

## 2022-01-24 DIAGNOSIS — I25119 Atherosclerotic heart disease of native coronary artery with unspecified angina pectoris: Secondary | ICD-10-CM

## 2022-01-24 NOTE — Telephone Encounter (Signed)
Per Domenic Polite, okay to order FLP.  Patient informed and verbalized understanding of plan. Lab order faxed to [facility]

## 2022-01-24 NOTE — Telephone Encounter (Signed)
Request Reference Number: [id number]. REPATHA INJ '140MG'$ /ML is denied for not meeting the prior authorization requirement(s). Details of this decision are in the notice attached below or have been faxed to you.  The denial was based on our criteria for Repatha Inj '140mg'$ /Ml.  Per your health plan's criteria, this drug is covered if you meet the following: Your bad cholesterol [low-density lipoprotein cholesterol (LDL-C)] decreases while on requested therapy. The information provided does not show that you meet the criteria listed above. Please speak with your doctor about your choices.

## 2022-01-30 ENCOUNTER — Telehealth: Payer: Self-pay | Admitting: Student in an Organized Health Care Education/Training Program

## 2022-01-30 ENCOUNTER — Encounter (HOSPITAL_COMMUNITY): Payer: Self-pay

## 2022-01-30 ENCOUNTER — Other Ambulatory Visit: Payer: Self-pay

## 2022-01-30 ENCOUNTER — Emergency Department (HOSPITAL_COMMUNITY): Payer: BC Managed Care – PPO

## 2022-01-30 ENCOUNTER — Observation Stay (HOSPITAL_COMMUNITY)
Admission: EM | Admit: 2022-01-30 | Discharge: 2022-02-01 | Disposition: A | Discharge status: Home / Self Care | Payer: BC Managed Care – PPO | Attending: Family Medicine | Admitting: Family Medicine

## 2022-01-30 DIAGNOSIS — Z1152 Encounter for screening for COVID-19: Secondary | ICD-10-CM | POA: Insufficient documentation

## 2022-01-30 DIAGNOSIS — I2510 Atherosclerotic heart disease of native coronary artery without angina pectoris: Secondary | ICD-10-CM | POA: Diagnosis not present

## 2022-01-30 DIAGNOSIS — R079 Chest pain, unspecified: Secondary | ICD-10-CM | POA: Diagnosis present

## 2022-01-30 DIAGNOSIS — Z87891 Personal history of nicotine dependence: Secondary | ICD-10-CM | POA: Insufficient documentation

## 2022-01-30 DIAGNOSIS — E782 Mixed hyperlipidemia: Secondary | ICD-10-CM | POA: Insufficient documentation

## 2022-01-30 DIAGNOSIS — R002 Palpitations: Secondary | ICD-10-CM | POA: Diagnosis not present

## 2022-01-30 DIAGNOSIS — Z7902 Long term (current) use of antithrombotics/antiplatelets: Secondary | ICD-10-CM | POA: Diagnosis not present

## 2022-01-30 DIAGNOSIS — I10 Essential (primary) hypertension: Secondary | ICD-10-CM | POA: Insufficient documentation

## 2022-01-30 DIAGNOSIS — Z8673 Personal history of transient ischemic attack (TIA), and cerebral infarction without residual deficits: Secondary | ICD-10-CM | POA: Diagnosis not present

## 2022-01-30 DIAGNOSIS — I2489 Other forms of acute ischemic heart disease: Principal | ICD-10-CM | POA: Insufficient documentation

## 2022-01-30 DIAGNOSIS — N1831 Chronic kidney disease, stage 3a: Secondary | ICD-10-CM | POA: Insufficient documentation

## 2022-01-30 DIAGNOSIS — Z7982 Long term (current) use of aspirin: Secondary | ICD-10-CM | POA: Insufficient documentation

## 2022-01-30 DIAGNOSIS — Z79899 Other long term (current) drug therapy: Secondary | ICD-10-CM | POA: Insufficient documentation

## 2022-01-30 DIAGNOSIS — I130 Hypertensive heart and chronic kidney disease with heart failure and stage 1 through stage 4 chronic kidney disease, or unspecified chronic kidney disease: Secondary | ICD-10-CM | POA: Insufficient documentation

## 2022-01-30 DIAGNOSIS — G4733 Obstructive sleep apnea (adult) (pediatric): Secondary | ICD-10-CM | POA: Insufficient documentation

## 2022-01-30 DIAGNOSIS — E669 Obesity, unspecified: Secondary | ICD-10-CM | POA: Insufficient documentation

## 2022-01-30 DIAGNOSIS — R7989 Other specified abnormal findings of blood chemistry: Secondary | ICD-10-CM | POA: Insufficient documentation

## 2022-01-30 DIAGNOSIS — I5022 Chronic systolic (congestive) heart failure: Secondary | ICD-10-CM | POA: Diagnosis not present

## 2022-01-30 DIAGNOSIS — Z955 Presence of coronary angioplasty implant and graft: Secondary | ICD-10-CM | POA: Diagnosis not present

## 2022-01-30 DIAGNOSIS — I499 Cardiac arrhythmia, unspecified: Secondary | ICD-10-CM | POA: Insufficient documentation

## 2022-01-30 DIAGNOSIS — I471 Supraventricular tachycardia, unspecified: Secondary | ICD-10-CM | POA: Insufficient documentation

## 2022-01-30 DIAGNOSIS — I214 Non-ST elevation (NSTEMI) myocardial infarction: Secondary | ICD-10-CM

## 2022-01-30 LAB — BASIC METABOLIC PANEL
Anion gap: 12 (ref 5–15)
BUN: 30 mg/dL — ABNORMAL HIGH (ref 8–23)
CO2: 23 mmol/L (ref 22–32)
Calcium: 8.8 mg/dL — ABNORMAL LOW (ref 8.9–10.3)
Chloride: 104 mmol/L (ref 98–111)
Creatinine, Ser: 1.59 mg/dL — ABNORMAL HIGH (ref 0.61–1.24)
GFR, Estimated: 49 mL/min — ABNORMAL LOW (ref >60)
Glucose, Bld: 108 mg/dL — ABNORMAL HIGH (ref 70–99)
Potassium: 4.1 mmol/L (ref 3.5–5.1)
Sodium: 139 mmol/L (ref 135–145)

## 2022-01-30 LAB — CBC WITH DIFFERENTIAL/PLATELET
Abs Immature Granulocytes: 0.09 10*3/uL — ABNORMAL HIGH (ref 0.00–0.07)
Basophils Absolute: 0.1 10*3/uL (ref 0.0–0.1)
Basophils Relative: 1 %
Eosinophils Absolute: 0.3 10*3/uL (ref 0.0–0.5)
Eosinophils Relative: 2 %
HCT: 47.4 % (ref 39.0–52.0)
Hemoglobin: 15.4 g/dL (ref 13.0–17.0)
Immature Granulocytes: 1 %
Lymphocytes Relative: 22 %
Lymphs Abs: 2.8 10*3/uL (ref 0.7–4.0)
MCH: 27.9 pg (ref 26.0–34.0)
MCHC: 32.5 g/dL (ref 30.0–36.0)
MCV: 85.9 fL (ref 80.0–100.0)
Monocytes Absolute: 1.1 10*3/uL — ABNORMAL HIGH (ref 0.1–1.0)
Monocytes Relative: 9 %
Neutro Abs: 8.6 10*3/uL — ABNORMAL HIGH (ref 1.7–7.7)
Neutrophils Relative %: 65 %
Platelets: 207 10*3/uL (ref 150–400)
RBC: 5.52 MIL/uL (ref 4.22–5.81)
RDW: 14.7 % (ref 11.5–15.5)
WBC: 12.9 10*3/uL — ABNORMAL HIGH (ref 4.0–10.5)
nRBC: 0 % (ref 0.0–0.2)

## 2022-01-30 LAB — CBC
HCT: 50.2 % (ref 39.0–52.0)
Hemoglobin: 16 g/dL (ref 13.0–17.0)
MCH: 27.9 pg (ref 26.0–34.0)
MCHC: 31.9 g/dL (ref 30.0–36.0)
MCV: 87.5 fL (ref 80.0–100.0)
Platelets: 231 10*3/uL (ref 150–400)
RBC: 5.74 MIL/uL (ref 4.22–5.81)
RDW: 15 % (ref 11.5–15.5)
WBC: 10.7 10*3/uL — ABNORMAL HIGH (ref 4.0–10.5)
nRBC: 0 % (ref 0.0–0.2)

## 2022-01-30 LAB — RESP PANEL BY RT-PCR (RSV, FLU A&B, COVID)  RVPGX2
Influenza A by PCR: NEGATIVE
Influenza B by PCR: NEGATIVE
Resp Syncytial Virus by PCR: NEGATIVE
SARS Coronavirus 2 by RT PCR: NEGATIVE

## 2022-01-30 LAB — TROPONIN I (HIGH SENSITIVITY)
Troponin I (High Sensitivity): 27 ng/L — ABNORMAL HIGH (ref <18)
Troponin I (High Sensitivity): 634 ng/L (ref <18)

## 2022-01-30 LAB — TSH: TSH: 1.055 u[IU]/mL (ref 0.350–4.500)

## 2022-01-30 LAB — D-DIMER, QUANTITATIVE: D-Dimer, Quant: <0.27 ug/mL-FEU (ref 0.00–0.50)

## 2022-01-30 MED ORDER — ONDANSETRON HCL 4 MG/2ML IJ SOLN: 4.0000 mg | Freq: Four times a day (QID) | INTRAMUSCULAR | Status: DC | PRN

## 2022-01-30 MED ORDER — ACETAMINOPHEN 325 MG PO TABS: 650.0000 mg | ORAL_TABLET | ORAL | Status: DC | PRN

## 2022-01-30 MED ORDER — SACUBITRIL-VALSARTAN 97-103 MG PO TABS
1.0000 | ORAL_TABLET | Freq: Two times a day (BID) | ORAL | Status: DC
  Administered 2022-01-30 – 2022-02-01 (×4): 1 via ORAL
  Filled 2022-01-30 (×8): qty 1

## 2022-01-30 MED ORDER — CARVEDILOL 25 MG PO TABS
25.0000 mg | ORAL_TABLET | Freq: Two times a day (BID) | ORAL | Status: DC
  Administered 2022-01-30 – 2022-02-01 (×4): 25 mg via ORAL
  Filled 2022-01-30 (×2): qty 1
  Filled 2022-01-30 (×2): qty 2

## 2022-01-30 MED ORDER — AMLODIPINE BESYLATE 5 MG PO TABS
5.0000 mg | ORAL_TABLET | Freq: Every day | ORAL | Status: DC
  Administered 2022-01-31 – 2022-02-01 (×2): 5 mg via ORAL
  Filled 2022-01-30 (×2): qty 1

## 2022-01-30 MED ORDER — PANTOPRAZOLE SODIUM 40 MG PO TBEC
40.0000 mg | DELAYED_RELEASE_TABLET | Freq: Every day | ORAL | Status: DC
  Administered 2022-01-31 – 2022-02-01 (×2): 40 mg via ORAL
  Filled 2022-01-30 (×2): qty 1

## 2022-01-30 MED ORDER — EMPAGLIFLOZIN 10 MG PO TABS
10.0000 mg | ORAL_TABLET | Freq: Every day | ORAL | Status: DC
  Administered 2022-01-31 – 2022-02-01 (×2): 10 mg via ORAL
  Filled 2022-01-30 (×5): qty 1

## 2022-01-30 MED ORDER — SODIUM CHLORIDE 0.9 % IV BOLUS
500.0000 mL | Freq: Once | INTRAVENOUS | Status: AC
  Administered 2022-01-30: 500 mL via INTRAVENOUS

## 2022-01-30 MED ORDER — HYDRALAZINE HCL 25 MG PO TABS
25.0000 mg | ORAL_TABLET | Freq: Three times a day (TID) | ORAL | Status: DC
  Administered 2022-01-30 – 2022-02-01 (×4): 25 mg via ORAL
  Filled 2022-01-30 (×4): qty 1

## 2022-01-30 MED ORDER — CLOPIDOGREL BISULFATE 75 MG PO TABS
75.0000 mg | ORAL_TABLET | Freq: Every day | ORAL | Status: DC
  Administered 2022-01-31 – 2022-02-01 (×2): 75 mg via ORAL
  Filled 2022-01-30 (×2): qty 1

## 2022-01-30 MED ORDER — SPIRONOLACTONE 25 MG PO TABS
25.0000 mg | ORAL_TABLET | Freq: Every day | ORAL | Status: DC
  Administered 2022-01-31 – 2022-02-01 (×2): 25 mg via ORAL
  Filled 2022-01-30 (×2): qty 1

## 2022-01-30 MED ORDER — HEPARIN SODIUM (PORCINE) 5000 UNIT/ML IJ SOLN
5000.0000 [IU] | Freq: Three times a day (TID) | INTRAMUSCULAR | Status: DC
  Administered 2022-01-30: 5000 [IU] via SUBCUTANEOUS
  Filled 2022-01-30: qty 1

## 2022-01-30 NOTE — ED Provider Notes (Signed)
[facility] EMERGENCY DEPARTMENT Provider Note   CSN: [id number] Arrival date & time: 01/30/22  1517     History  Chief Complaint  Patient presents with   Chest Pain    DAO MEMMOTT is a 61 y.o. male.  Pt is a 61 yo male with a pmhx significant for CAD, HTN, GERD, SVT, CM, and sleep apnea.  Pt was driving back from [place], [place] and developed palpitations.  He has a watch which shows his HR.  HR in the upper 140s.  Pt said he thought it would get better, but it did not, so he came in.  He feels fine now and HR is better.  He denies sob.  He said he normally stays away from caffeine, but drank caffeine this past weekend.         Home Medications Prior to Admission medications   Medication Sig Start Date End Date Taking? Authorizing Provider  amLODipine (NORVASC) 5 MG tablet Take 1 tablet by mouth daily. 05/16/20  Yes [provider]  amoxicillin-clavulanate (AUGMENTIN) 500-125 MG tablet Take 1 tablet by mouth 2 (two) times daily. 01/23/22  Yes [provider]  ASPIRIN LOW DOSE 81 MG EC tablet Take 81 mg by mouth daily. 04/29/20  Yes [provider]  carvedilol (COREG) 25 MG tablet Take 25 mg by mouth 2 (two) times daily. 04/20/20  Yes [provider]  clopidogrel (PLAVIX) 75 MG tablet Take 75 mg by mouth daily. 04/29/20  Yes [provider]  DENTA 5000 PLUS 1.1 % CREA dental cream Take 1 application by mouth at bedtime. 04/21/20  Yes [provider]  empagliflozin (JARDIANCE) 10 MG TABS tablet Take 1 tablet (10 mg total) by mouth daily before breakfast. 09/08/20  Yes Bensimhon, Shaune Pascal, MD  Evolocumab (REPATHA) 140 MG/ML SOSY Inject 140 mg into the skin every 14 (fourteen) days. 12/04/21  Yes Satira Sark, MD  hydrALAZINE (APRESOLINE) 25 MG tablet Take 25 mg by mouth 3 (three) times daily.   Yes [provider]  Multiple Vitamin (MULTIVITAMIN) tablet Take 1 tablet by mouth daily.   Yes [provider]   pantoprazole (PROTONIX) 40 MG tablet Take 1 tablet (40 mg total) by mouth daily. 01/26/11 01/30/22 Yes Rehman, Mechele Dawley, MD  predniSONE (DELTASONE) 10 MG tablet Take 10 mg by mouth 2 (two) times daily. 01/23/22  Yes [provider]  rosuvastatin (CRESTOR) 40 MG tablet Take 1 tablet (40 mg total) by mouth daily. 11/07/20  Yes Verta Ellen., NP  sacubitril-valsartan (ENTRESTO) 97-103 MG Take 1 tablet by mouth 2 (two) times daily. 12/20/20  Yes Bensimhon, Shaune Pascal, MD  spironolactone (ALDACTONE) 25 MG tablet Take 1 tablet (25 mg total) by mouth daily. 05/03/21  Yes Bensimhon, Shaune Pascal, MD  torsemide (DEMADEX) 20 MG tablet Take 1 tablet (20 mg total) by mouth daily as needed. 09/08/20  Yes Bensimhon, Shaune Pascal, MD  meclizine (ANTIVERT) 25 MG tablet Take 1 tablet (25 mg total) by mouth 3 (three) times daily as needed for up to 15 doses for dizziness. 10/07/20   Luna Fuse, MD      Allergies    Banana, Sunflower oil, and Tomato    Review of Systems   Review of Systems  Cardiovascular:  Positive for palpitations.  All other systems reviewed and are negative.   Physical Exam Updated Vital Signs BP (!) 141/84   Pulse (!) 58   Temp (!) 97.5 F (36.4 C) (Axillary)   Resp  10   Ht 6' (1.829 m)   Wt 119.1 kg   SpO2 97%   BMI 35.61 kg/m  Physical Exam Vitals and nursing note reviewed.  Constitutional:      Appearance: He is well-developed. He is obese.  HENT:     Head: Normocephalic and atraumatic.  Eyes:     Extraocular Movements: Extraocular movements intact.     Pupils: Pupils are equal, round, and reactive to light.  Cardiovascular:     Rate and Rhythm: Normal rate and regular rhythm.     Heart sounds: Normal heart sounds.  Pulmonary:     Effort: Pulmonary effort is normal.     Breath sounds: Normal breath sounds.  Abdominal:     General: Bowel sounds are normal.     Palpations: Abdomen is soft.  Musculoskeletal:        General: Normal range of motion.      Cervical back: Normal range of motion and neck supple.  Skin:    General: Skin is warm.     Capillary Refill: Capillary refill takes less than 2 seconds.  Neurological:     General: No focal deficit present.     Mental Status: He is alert and oriented to person, place, and time.  Psychiatric:        Mood and Affect: Mood normal.        Behavior: Behavior normal.     ED Results / Procedures / Treatments   Labs (all labs ordered are listed, but only abnormal results are displayed) Labs Reviewed  BASIC METABOLIC PANEL - Abnormal; Notable for the following components:      Result Value   Glucose, Bld 108 (*)    BUN 30 (*)    Creatinine, Ser 1.59 (*)    Calcium 8.8 (*)    GFR, Estimated 49 (*)    All other components within normal limits  TROPONIN I (HIGH SENSITIVITY) - Abnormal; Notable for the following components:   Troponin I (High Sensitivity) 27 (*)    All other components within normal limits  TROPONIN I (HIGH SENSITIVITY) - Abnormal; Notable for the following components:   Troponin I (High Sensitivity) 634 (*)    All other components within normal limits  RESP PANEL BY RT-PCR (RSV, FLU A&B, COVID)  [id number]  TSH  D-DIMER, QUANTITATIVE (NOT AT [facility])  CBC  CBC WITH DIFFERENTIAL/PLATELET    EKG EKG Interpretation  Date/Time:  Tuesday January 30 2022 20:57:09 EST Ventricular Rate:  59 PR Interval:  169 QRS Duration: 100 QT Interval:  420 QTC Calculation: 416 R Axis:   82 Text Interpretation: Sinus rhythm Borderline right axis deviation Abnormal R-wave progression, early transition Abnormal inferior Q waves now sb Confirmed by Isla Pence [id number]) on 01/30/2022 9:13:56 PM  Radiology DG Chest Port 1 View  Result Date: 01/30/2022 CLINICAL DATA:  Chest pain. EXAM: PORTABLE CHEST 1 VIEW COMPARISON:  May 04, 2020. FINDINGS: The heart size and mediastinal contours are within normal limits. Both lungs are clear. The visualized skeletal structures are unremarkable.  IMPRESSION: No active disease. Electronically Signed   By: Marijo Conception M.D.   On: 01/30/2022 16:43    Procedures Procedures    Medications Ordered in ED Medications  sodium chloride 0.9 % bolus 500 mL (500 mLs Intravenous New Bag/Given 01/30/22 1958)    ED Course/ Medical Decision Making/ A&P  Medical Decision Making Amount and/or Complexity of Data Reviewed Labs: ordered. Radiology: ordered.  Risk Decision regarding hospitalization.   This patient presents to the ED for concern of palpitations, this involves an extensive number of treatment options, and is a complaint that carries with it a high risk of complications and morbidity.  The differential diagnosis includes svt, vt, vf, afib   Co morbidities that complicate the patient evaluation  CAD, HTN, GERD, SVT, CM, and sleep apnea   Additional history obtained:  Additional history obtained from epic chart review External records from outside source obtained and reviewed including wife   Lab Tests:  I Ordered, and personally interpreted labs.  The pertinent results include:  bmp with bun 30 and cr 1.59 (chronic); covid/flu neg, ddimer neg, trop 27; 2nd trop went up to 634   Imaging Studies ordered:  I ordered imaging studies including cxr  I independently visualized and interpreted imaging which showed No active disease.  I agree with the radiologist interpretation   Cardiac Monitoring:  The patient was maintained on a cardiac monitor.  I personally viewed and interpreted the cardiac monitored which showed an underlying rhythm of: nsr   Medicines ordered and prescription drug management:  I ordered medication including ivfs  for dehydration  Reevaluation of the patient after these medicines showed that the patient improved I have reviewed the patients home medicines and have made adjustments as needed   Test Considered:  trop   Consultations Obtained:  I requested  consultation with the cardiologist (Dr. Renella Cunas) ,  and discussed lab and imaging findings as well as pertinent plan - he recommends keeping pt here for serial troponins.  He said to hold off on heparin as cp was associated with palpitations and he is cp free now. Pt d/w Dr. Clearence Ped (triad) for admission   Problem List / ED Course:  Palpitations:  ST vs SVT.  Pt spontaneously converted without intervention and cp is gone.  Trop is elevated, possibly due to strain.  Pt will be admitted to follow troponins.  Cards to see in the am.   Reevaluation:  After the interventions noted above, I reevaluated the patient and found that they have :improved   Social Determinants of Health:  Lives at home   Dispostion:  After consideration of the diagnostic results and the patients response to treatment, I feel that the patent would benefit from admission.          Final Clinical Impression(s) / ED Diagnoses Final diagnoses:  Palpitations  Elevated troponin    Rx / DC Orders ED Discharge Orders     None         Isla Pence, MD 01/30/22 2117

## 2022-01-30 NOTE — Telephone Encounter (Signed)
Paged regarding patient a [facility] by [facility]/Dr. Gilford Raid.   79M with HFrEF with recovered LVEF, NYHA class II sx, ICM (mLAD PCI x1, 05/2020), HTN, OSA, prior CVA, paroxysmal SVT and GERD who was brought in by his wife with SOB with associated palpitations. HR 140s by his watch.  ECG  Result date: 01/30/22 (15:27:11)  AFL vs SVT (VR 147), PR 210, QRS 88, QT/Qtc 316/494  ST depressions V2-V4  TTE  Result date: 08/14/21  1. Left ventricular ejection fraction, by estimation, is 60 to 65%. The  left ventricle has normal function. The left ventricle has no regional  wall motion abnormalities. Left ventricular diastolic parameters are  consistent with Grade I diastolic  dysfunction (impaired relaxation). The average left ventricular global  longitudinal strain is -16.7 %. The global longitudinal strain is normal.   2. Right ventricular systolic function is normal. The right ventricular  size is normal. Tricuspid regurgitation signal is inadequate for assessing  PA pressure.   3. The mitral valve is abnormal. Trivial mitral valve regurgitation. No  evidence of mitral stenosis.   4. The aortic valve has an indeterminant number of cusps. Aortic valve  regurgitation is not visualized. No aortic stenosis is present.   5. The inferior vena cava is normal in size with greater than 50%  respiratory variability, suggesting right atrial pressure of 3 mmHg.   Coronary angiography/PCI  Result date: 05/1520 Prox LAD lesion is 50% stenosed. Mid LAD lesion is 50% stenosed. Dist LAD lesion is 95% stenosed. A drug-eluting stent was successfully placed using a SYNERGY XD [id number]. Post intervention, there is a 0% residual stenosis.  1. Severe stenosis mid LAD 2. Successful PTCA/DES x 1 mid LAD  Mid RCA lesion is 40% stenosed. Dist RCA lesion is 75% stenosed with 75% stenosed side branch in RPDA. 2nd Mrg lesion is 100% stenosed. Ost Cx to Prox Cx lesion is 30% stenosed. Dist LAD lesion is 95%  stenosed. Ost LAD to Prox LAD lesion is 50% stenosed. Mid LAD lesion is 50% stenosed. 1st Diag lesion is 95% stenosed.   Findings:   Ao = 149/78 (104) LV =  139/10 RA =  3 RV = 19/3 PA = 21/9 (14) PCW = 4 Fick cardiac output/index = 6.2/2.5 PVR = 1.5 WU Ao sat = 99% PA sat = 72%, 76%   Assessment: 1. 3v CAD 2. EF 40-45% 3. Well compensated hemodynamics  CP resolved with resolution of palpitations.  Patient was driving and had acute onset palpitations followed by CP. SVT caught on ECG as above. Shortly thereafter patient went to NSR and chest pain as well resolved. He is already on ASA/plavix s/p PCI (05/20/20). He had residual prox and mLAD stenosis post PCI (distal LAD) of 50% in both segments (*written as distal PAD PCI, looked at primary cath films, proximal disease present, more mid-distal PCI). Had residual disease as above (most notable OM2 and RCA).   No other symptoms on presentation today. Labs reviewed and negative for covid/flu. hsT only mildly elevated initially (27 at 167:19) however with significant delta (634 at 18:38) with previously normal hsT in the past (6-24). He has CKD as baseline (sCr 1.59 today, prior 1.5-2.1). D-dimer negative.   Based off acute onset of sx following palpitations and acute resolution of sx after cessation of SVT I would favor this to be a type 2 NSTEMI in the setting of residual known CAD and SVT. He had a similar presentation 02/06/16 with acute onset CP with associated diaphoresis  and SOB with 2 prior episodes before that. 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 ADO with conversion to NSR and resolution of sx. He has no urgent indication for transfer to [facility] now that he is asx and in NSR. He will need EP evaluation since this is at least his second ED evaluation (and possibly fourth evaluation for similar). Would not  heparinize unless CP recurs while in NSR. Can continue all other medications. Trend hsT. Cardiology to evaluate in the morning.

## 2022-01-30 NOTE — ED Triage Notes (Signed)
Pt BIB wife, began having SHOB, increased pulse rate, radiates to L arm, cold sweats. Hx of stroke, heart attack.

## 2022-01-31 ENCOUNTER — Other Ambulatory Visit: Payer: Self-pay

## 2022-01-31 ENCOUNTER — Observation Stay (HOSPITAL_BASED_OUTPATIENT_CLINIC_OR_DEPARTMENT_OTHER): Payer: BC Managed Care – PPO

## 2022-01-31 ENCOUNTER — Encounter: Payer: Self-pay | Admitting: *Deleted

## 2022-01-31 ENCOUNTER — Encounter (HOSPITAL_COMMUNITY): Payer: Self-pay | Admitting: Family Medicine

## 2022-01-31 ENCOUNTER — Encounter (HOSPITAL_COMMUNITY)
Admission: EM | Disposition: A | Discharge status: Home / Self Care | Payer: Self-pay | Source: Home / Self Care | Attending: Emergency Medicine

## 2022-01-31 DIAGNOSIS — I5022 Chronic systolic (congestive) heart failure: Secondary | ICD-10-CM | POA: Diagnosis not present

## 2022-01-31 DIAGNOSIS — I2510 Atherosclerotic heart disease of native coronary artery without angina pectoris: Secondary | ICD-10-CM

## 2022-01-31 DIAGNOSIS — R7989 Other specified abnormal findings of blood chemistry: Secondary | ICD-10-CM

## 2022-01-31 DIAGNOSIS — I214 Non-ST elevation (NSTEMI) myocardial infarction: Secondary | ICD-10-CM

## 2022-01-31 DIAGNOSIS — I499 Cardiac arrhythmia, unspecified: Secondary | ICD-10-CM | POA: Diagnosis not present

## 2022-01-31 DIAGNOSIS — R079 Chest pain, unspecified: Secondary | ICD-10-CM | POA: Diagnosis not present

## 2022-01-31 DIAGNOSIS — I471 Supraventricular tachycardia, unspecified: Secondary | ICD-10-CM | POA: Insufficient documentation

## 2022-01-31 DIAGNOSIS — I209 Angina pectoris, unspecified: Secondary | ICD-10-CM

## 2022-01-31 DIAGNOSIS — I10 Essential (primary) hypertension: Secondary | ICD-10-CM

## 2022-01-31 DIAGNOSIS — R000 Tachycardia, unspecified: Secondary | ICD-10-CM | POA: Diagnosis not present

## 2022-01-31 DIAGNOSIS — E782 Mixed hyperlipidemia: Secondary | ICD-10-CM

## 2022-01-31 HISTORY — PX: LEFT HEART CATH AND CORONARY ANGIOGRAPHY: CATH118249

## 2022-01-31 LAB — COMPREHENSIVE METABOLIC PANEL
ALT: 15 U/L (ref 0–44)
AST: 21 U/L (ref 15–41)
Albumin: 3.4 g/dL — ABNORMAL LOW (ref 3.5–5.0)
Alkaline Phosphatase: 45 U/L (ref 38–126)
Anion gap: 7 (ref 5–15)
BUN: 29 mg/dL — ABNORMAL HIGH (ref 8–23)
CO2: 23 mmol/L (ref 22–32)
Calcium: 8.6 mg/dL — ABNORMAL LOW (ref 8.9–10.3)
Chloride: 110 mmol/L (ref 98–111)
Creatinine, Ser: 1.31 mg/dL — ABNORMAL HIGH (ref 0.61–1.24)
GFR, Estimated: >60 mL/min (ref >60)
Glucose, Bld: 122 mg/dL — ABNORMAL HIGH (ref 70–99)
Potassium: 3.5 mmol/L (ref 3.5–5.1)
Sodium: 140 mmol/L (ref 135–145)
Total Bilirubin: 0.6 mg/dL (ref 0.3–1.2)
Total Protein: 6.2 g/dL — ABNORMAL LOW (ref 6.5–8.1)

## 2022-01-31 LAB — CBC
HCT: 48.3 % (ref 39.0–52.0)
Hemoglobin: 15.6 g/dL (ref 13.0–17.0)
MCH: 28 pg (ref 26.0–34.0)
MCHC: 32.3 g/dL (ref 30.0–36.0)
MCV: 86.6 fL (ref 80.0–100.0)
Platelets: 201 10*3/uL (ref 150–400)
RBC: 5.58 MIL/uL (ref 4.22–5.81)
RDW: 14.8 % (ref 11.5–15.5)
WBC: 10.2 10*3/uL (ref 4.0–10.5)
nRBC: 0 % (ref 0.0–0.2)

## 2022-01-31 LAB — ECHOCARDIOGRAM COMPLETE
AR max vel: 2.98 cm2
AV Area VTI: 2.9 cm2
AV Area mean vel: 3.16 cm2
AV Mean grad: 4 mmHg
AV Peak grad: 7.4 mmHg
Ao pk vel: 1.36 m/s
Area-P 1/2: 2.74 cm2
Height: 72 in
MV VTI: 2.34 cm2
S' Lateral: 3.5 cm
Weight: 4204.61 oz

## 2022-01-31 LAB — HIV ANTIBODY (ROUTINE TESTING W REFLEX): HIV Screen 4th Generation wRfx: NONREACTIVE

## 2022-01-31 LAB — MAGNESIUM: Magnesium: 2.3 mg/dL (ref 1.7–2.4)

## 2022-01-31 LAB — LIPID PANEL
Cholesterol: 110 mg/dL (ref 0–200)
HDL: 61 mg/dL (ref >40)
LDL Cholesterol: 19 mg/dL (ref 0–99)
Total CHOL/HDL Ratio: 1.8 RATIO
Triglycerides: 150 mg/dL — ABNORMAL HIGH (ref <150)
VLDL: 30 mg/dL (ref 0–40)

## 2022-01-31 LAB — TROPONIN I (HIGH SENSITIVITY)
Troponin I (High Sensitivity): 2141 ng/L (ref <18)
Troponin I (High Sensitivity): 2405 ng/L (ref <18)

## 2022-01-31 LAB — HEPARIN LEVEL (UNFRACTIONATED): Heparin Unfractionated: 0.92 IU/mL — ABNORMAL HIGH (ref 0.30–0.70)

## 2022-01-31 SURGERY — LEFT HEART CATH AND CORONARY ANGIOGRAPHY
Anesthesia: LOCAL

## 2022-01-31 MED ORDER — LIDOCAINE HCL (PF) 1 % IJ SOLN
INTRAMUSCULAR | Status: DC | PRN
  Administered 2022-01-31: 5 mL via INTRADERMAL

## 2022-01-31 MED ORDER — SODIUM CHLORIDE 0.9 % IV SOLN: INTRAVENOUS | Status: AC

## 2022-01-31 MED ORDER — IOHEXOL 350 MG/ML SOLN
INTRAVENOUS | Status: DC | PRN
  Administered 2022-01-31: 72 mL

## 2022-01-31 MED ORDER — LIDOCAINE HCL (PF) 1 % IJ SOLN
INTRAMUSCULAR | Status: AC
  Filled 2022-01-31: qty 30

## 2022-01-31 MED ORDER — HEPARIN SODIUM (PORCINE) 1000 UNIT/ML IJ SOLN
INTRAMUSCULAR | Status: AC
  Filled 2022-01-31: qty 10

## 2022-01-31 MED ORDER — SODIUM CHLORIDE 0.9 % WEIGHT BASED INFUSION: 1.0000 mL/kg/h | INTRAVENOUS | Status: DC

## 2022-01-31 MED ORDER — VERAPAMIL HCL 2.5 MG/ML IV SOLN
INTRAVENOUS | Status: AC
  Filled 2022-01-31: qty 2

## 2022-01-31 MED ORDER — HEPARIN (PORCINE) 25000 UT/250ML-% IV SOLN
1300.0000 [IU]/h | INTRAVENOUS | Status: DC
  Administered 2022-01-31: 1300 [IU]/h via INTRAVENOUS
  Administered 2022-01-31: 1500 [IU]/h via INTRAVENOUS
  Filled 2022-01-31 (×2): qty 250

## 2022-01-31 MED ORDER — SODIUM CHLORIDE 0.9% FLUSH: 3.0000 mL | INTRAVENOUS | Status: DC | PRN

## 2022-01-31 MED ORDER — FENTANYL CITRATE (PF) 100 MCG/2ML IJ SOLN
INTRAMUSCULAR | Status: AC
  Filled 2022-01-31: qty 2

## 2022-01-31 MED ORDER — SODIUM CHLORIDE 0.9% FLUSH
3.0000 mL | Freq: Two times a day (BID) | INTRAVENOUS | Status: DC
  Administered 2022-01-31 – 2022-02-01 (×3): 3 mL via INTRAVENOUS

## 2022-01-31 MED ORDER — HEPARIN SODIUM (PORCINE) 1000 UNIT/ML IJ SOLN
INTRAMUSCULAR | Status: DC | PRN
  Administered 2022-01-31: 6000 [IU] via INTRAVENOUS

## 2022-01-31 MED ORDER — HEPARIN (PORCINE) IN NACL 1000-0.9 UT/500ML-% IV SOLN
INTRAVENOUS | Status: AC
  Filled 2022-01-31: qty 1000

## 2022-01-31 MED ORDER — VERAPAMIL HCL 2.5 MG/ML IV SOLN
INTRAVENOUS | Status: DC | PRN
  Administered 2022-01-31: 10 mL via INTRA_ARTERIAL

## 2022-01-31 MED ORDER — HEPARIN BOLUS VIA INFUSION
4000.0000 [IU] | Freq: Once | INTRAVENOUS | Status: AC
  Administered 2022-01-31: 4000 [IU] via INTRAVENOUS

## 2022-01-31 MED ORDER — SODIUM CHLORIDE 0.9 % IV SOLN: 250.0000 mL | INTRAVENOUS | Status: DC | PRN

## 2022-01-31 MED ORDER — ASPIRIN 81 MG PO CHEW
81.0000 mg | CHEWABLE_TABLET | ORAL | Status: AC
  Administered 2022-01-31: 81 mg via ORAL
  Filled 2022-01-31: qty 1

## 2022-01-31 MED ORDER — MIDAZOLAM HCL 2 MG/2ML IJ SOLN
INTRAMUSCULAR | Status: AC
  Filled 2022-01-31: qty 2

## 2022-01-31 MED ORDER — SODIUM CHLORIDE 0.9 % WEIGHT BASED INFUSION: 3.0000 mL/kg/h | INTRAVENOUS | Status: DC

## 2022-01-31 MED ORDER — FENTANYL CITRATE (PF) 100 MCG/2ML IJ SOLN
INTRAMUSCULAR | Status: DC | PRN
  Administered 2022-01-31: 25 ug via INTRAVENOUS

## 2022-01-31 MED ORDER — MIDAZOLAM HCL 2 MG/2ML IJ SOLN
INTRAMUSCULAR | Status: DC | PRN
  Administered 2022-01-31: 2 mg via INTRAVENOUS

## 2022-01-31 MED ORDER — LABETALOL HCL 5 MG/ML IV SOLN: 10.0000 mg | INTRAVENOUS | Status: AC | PRN

## 2022-01-31 MED ORDER — HEPARIN (PORCINE) IN NACL 1000-0.9 UT/500ML-% IV SOLN
INTRAVENOUS | Status: DC | PRN
  Administered 2022-01-31 (×2): 500 mL

## 2022-01-31 MED ORDER — ASPIRIN 81 MG PO CHEW: 81.0000 mg | CHEWABLE_TABLET | ORAL | Status: DC

## 2022-01-31 MED ORDER — SODIUM CHLORIDE 0.9% FLUSH
3.0000 mL | Freq: Two times a day (BID) | INTRAVENOUS | Status: DC
  Administered 2022-02-01 (×2): 3 mL via INTRAVENOUS

## 2022-01-31 MED ORDER — HYDRALAZINE HCL 20 MG/ML IJ SOLN: 10.0000 mg | INTRAMUSCULAR | Status: AC | PRN

## 2022-01-31 SURGICAL SUPPLY — 10 items
CATH 5FR JL3.5 JR4 ANG PIG MP (CATHETERS) IMPLANT
DEVICE RAD COMP TR BAND LRG (VASCULAR PRODUCTS) IMPLANT
GLIDESHEATH SLEND SS 6F .021 (SHEATH) IMPLANT
GUIDEWIRE INQWIRE 1.5J.035X260 (WIRE) IMPLANT
INQWIRE 1.5J .035X260CM (WIRE) ×1
KIT HEART LEFT (KITS) ×1 IMPLANT
PACK CARDIAC CATHETERIZATION (CUSTOM PROCEDURE TRAY) ×1 IMPLANT
SYR MEDRAD MARK 7 150ML (SYRINGE) ×1 IMPLANT
TRANSDUCER W/STOPCOCK (MISCELLANEOUS) ×1 IMPLANT
TUBING CIL FLEX 10 FLL-RA (TUBING) ×1 IMPLANT

## 2022-01-31 NOTE — Progress Notes (Signed)
Patient arrived via [facility]. Consent was signed and dated.  Patient hemodynamically stable upon arrival.

## 2022-01-31 NOTE — H&P (Signed)
History and Physical    Patient: Dean Wilson BOF:[id number] DOB: 06-12-1960 DOA: 01/30/2022 DOS: the patient was seen and examined on 01/31/2022 PCP: Glenda Chroman, MD  Patient coming from: Home  Chief Complaint:  Chief Complaint  Patient presents with   Chest Pain   HPI: Dean Wilson is a 61 y.o. male with medical history significant of coronary artery disease, essential hypertension, GERD, OSA, stroke, and more presents ED with a chief complaint of elevated heart rate.  Patient reports he was driving home from [facility] when he started having palpitations and chest tightness.  Reports it started around 3:30 PM on the 26th.  He had no missed doses of Coreg.  He had been compliant with his CPAP.  His symptoms lasted about 45 minutes.  They went away when he bear down to use the bathroom here in the ER.  Symptoms have not returned since then.  Patient reports that when the symptoms were going on he had associated dizziness, diaphoresis.  He denies any nausea, vomiting.  He reports he did feel like he was going to have diarrhea, but that did not end up happening.  He does have a history of atrial fibrillation.  He reports he was diagnosed at [facility] years ago.  Used to take a medication for it, but does not remember which medication that was.  Caffeine use 2 trigger it.  He has reduced his caffeine and has been off his atrial fibrillation medication for years.  Patient recently did have a sinus infection was on Augmentin and prednisone.  He reports other than that he has been in his normal state of health.  Patient has no other complaints at this time.  If symptoms recur, or patient becomes hypoxic it may be worth it to get a CTA of his chest given his long trip to [place].  Patient does not smoke, does not drink.  He did receive his COVID shot.  Patient appears Review of Systems: As mentioned in the history of present illness. All other systems reviewed and  are negative. Past Medical History:  Diagnosis Date   CAD (coronary artery disease)    DES to mid LAD April 2022   Essential hypertension    GERD (gastroesophageal reflux disease)    History of cardiomyopathy    OSA on CPAP    PSVT (paroxysmal supraventricular tachycardia)    Stroke [facility])    Past Surgical History:  Procedure Laterality Date   CORONARY STENT INTERVENTION N/A 05/20/2020   Procedure: CORONARY STENT INTERVENTION;  Surgeon: Burnell Blanks, MD;  Location: [facility];  Service: Cardiovascular;  Laterality: N/A;   RIGHT/LEFT HEART CATH AND CORONARY ANGIOGRAPHY N/A 05/20/2020   Procedure: RIGHT/LEFT HEART CATH AND CORONARY ANGIOGRAPHY;  Surgeon: Jolaine Artist, MD;  Location: [facility];  Service: Cardiovascular;  Laterality: N/A;   UMBILICAL HERNIA REPAIR     Social History:  reports that he quit smoking about 14 years ago. His smoking use included cigarettes. He has a 50.00 pack-year smoking history. He has never used smokeless tobacco. He reports that he does not drink alcohol and does not use drugs.  Allergies  Allergen Reactions   Banana Itching   Sunflower Oil Itching    Seeds not oil   Tomato Itching    Family History  Problem Relation Age of Onset   Anesthesia problems Neg Hx    Hypotension Neg Hx    Malignant hyperthermia Neg Hx  Pseudochol deficiency Neg Hx     Prior to Admission medications   Medication Sig Start Date End Date Taking? Authorizing Provider  amLODipine (NORVASC) 5 MG tablet Take 1 tablet by mouth daily. 05/16/20  Yes [provider]  amoxicillin-clavulanate (AUGMENTIN) 500-125 MG tablet Take 1 tablet by mouth 2 (two) times daily. 01/23/22  Yes [provider]  ASPIRIN LOW DOSE 81 MG EC tablet Take 81 mg by mouth daily. 04/29/20  Yes [provider]  carvedilol (COREG) 25 MG tablet Take 25 mg by mouth 2 (two) times daily. 04/20/20  Yes [provider]  clopidogrel (PLAVIX) 75 MG  tablet Take 75 mg by mouth daily. 04/29/20  Yes [provider]  DENTA 5000 PLUS 1.1 % CREA dental cream Take 1 application by mouth at bedtime. 04/21/20  Yes [provider]  empagliflozin (JARDIANCE) 10 MG TABS tablet Take 1 tablet (10 mg total) by mouth daily before breakfast. 09/08/20  Yes Bensimhon, Shaune Pascal, MD  Evolocumab (REPATHA) 140 MG/ML SOSY Inject 140 mg into the skin every 14 (fourteen) days. 12/04/21  Yes Satira Sark, MD  hydrALAZINE (APRESOLINE) 25 MG tablet Take 25 mg by mouth 3 (three) times daily.   Yes [provider]  Multiple Vitamin (MULTIVITAMIN) tablet Take 1 tablet by mouth daily.   Yes [provider]  pantoprazole (PROTONIX) 40 MG tablet Take 1 tablet (40 mg total) by mouth daily. 01/26/11 01/30/22 Yes Rehman, Mechele Dawley, MD  predniSONE (DELTASONE) 10 MG tablet Take 10 mg by mouth 2 (two) times daily. 01/23/22  Yes [provider]  rosuvastatin (CRESTOR) 40 MG tablet Take 1 tablet (40 mg total) by mouth daily. 11/07/20  Yes Verta Ellen., NP  sacubitril-valsartan (ENTRESTO) 97-103 MG Take 1 tablet by mouth 2 (two) times daily. 12/20/20  Yes Bensimhon, Shaune Pascal, MD  spironolactone (ALDACTONE) 25 MG tablet Take 1 tablet (25 mg total) by mouth daily. 05/03/21  Yes Bensimhon, Shaune Pascal, MD  torsemide (DEMADEX) 20 MG tablet Take 1 tablet (20 mg total) by mouth daily as needed. 09/08/20  Yes Bensimhon, Shaune Pascal, MD  meclizine (ANTIVERT) 25 MG tablet Take 1 tablet (25 mg total) by mouth 3 (three) times daily as needed for up to 15 doses for dizziness. 10/07/20   Luna Fuse, MD    Physical Exam: Vitals:   01/31/22 0130 01/31/22 0200 01/31/22 0400 01/31/22 0430  BP: 111/71 112/69 124/61 (!) 119/95  Pulse: (!) 55 (!) 55 68 (!) 57  Resp: '10 16 16 '$ (!) 23  Temp:      TempSrc:      SpO2: 97% 92% 93% 100%  Weight:      Height:       1.  General: Patient lying supine in bed,  no acute distress   2. Psychiatric: Alert and  oriented x 3, mood and behavior normal for situation, pleasant and cooperative with exam   3. Neurologic: Speech and language are normal, face is symmetric, moves all 4 extremities voluntarily, at baseline without acute deficits on limited exam   4. HEENMT:  Head is atraumatic, normocephalic, pupils reactive to light, neck is supple, trachea is midline, mucous membranes are moist   5. Respiratory : Lungs are clear to auscultation bilaterally without wheezing, rhonchi, rales, no cyanosis, no increase in work of breathing or accessory muscle use   6. Cardiovascular : Heart rate normal, rhythm is regular, no murmurs, rubs or gallops, no peripheral edema, peripheral pulses palpated  7. Gastrointestinal:  Abdomen is soft, nondistended, nontender to palpation bowel sounds active, no masses or organomegaly palpated   8. Skin:  Skin is warm, dry and intact without rashes, acute lesions, or ulcers on limited exam   9.Musculoskeletal:  No acute deformities or trauma, no asymmetry in tone, no peripheral edema, peripheral pulses palpated, no tenderness to palpation in the extremities  Data Reviewed: In the ED Temp 97.5-97.7, heart rate 63-96, respiratory rate 10-22, blood pressure 107/72-128/98, satting 97% Trope 27, 634>> 2400 Chemistry panel reveals an elevated creatinine that seems to be close to baseline Negative COVID flu Chest x-ray shows no active disease Patient was given a 500 mL bolus Cardiology was consulted and recommends admission for cardiology inpatient consult Assessment and Plan: * Chest pain - Patient had chest tightness that was associated with palpitations - When the palpitations went away so did the chest pain - He has significant elevation in troponin from 27> 634> 2141> 2405 - When cardiology was first consulted he had only gone from 27-6 34 and they did not recommend heparin at that time - As troponin continue to increase heparin was started, cardiology consult  placed - Monitor on telemetry - EKG was difficult to interpret with cardiology stating that could be atrial flutter or SVT.  There were no acute ischemic changes - Patient does report a history of atrial fibrillation - Echo in the a.m. - Continue to monitor  Mixed hyperlipidemia - Continue Repatha at discharge  Essential hypertension - Continue Coreg, hydralazine, Aldactone  Elevated troponin - Troponin significantly elevated from 27>> 2400 - Heparin started - Consult cardiology - Monitor on telemetry - EKG does not show acute ischemic changes  Arrhythmia - History of atrial fibrillation - Reports he was taken off of medication for atrial fibrillation years ago - Still follows with cardiology - Arrhythmia spontaneously resolved when patient was bearing down in the bathroom - Currently in normal sinus rhythm on the monitor - Continue to monitor on telemetry - Cardiology consulted  Chronic systolic congestive heart failure (HCC) - Last echo showed grade 1 diastolic heart failure with an ejection fraction of 60 to 65% - Update echo - Continue Coreg, Crestor, Entresto, Aldactone, Jardiance, Repatha - Cardiology consulted - Continue to monitor      Advance Care Planning:   Code Status: Full Code  Consults: Cardiology  Family Communication: No family at bedside  Severity of Illness: The appropriate patient status for this patient is OBSERVATION. Observation status is judged to be reasonable and necessary in order to provide the required intensity of service to ensure the patient's safety. The patient's presenting symptoms, physical exam findings, and initial radiographic and laboratory data in the context of their medical condition is felt to place them at decreased risk for further clinical deterioration. Furthermore, it is anticipated that the patient will be medically stable for discharge from the hospital within 2 midnights of admission.   Author: Rolla Plate,  DO 01/31/2022 6:27 AM  For on call review [email or web address].

## 2022-01-31 NOTE — Progress Notes (Signed)
  Transition of Care [facility]) Screening Note   Patient Details  Name: Dean Wilson Date of Birth: Jul 28, 1960   [facility]) CM/SW Contact:    Ihor Gully, LCSW Phone Number: 01/31/2022, 2:01 PM    Transition of Care Department [facility]) has reviewed patient and no TOC needs have been identified at this time. We will continue to monitor patient advancement through interdisciplinary progression rounds. If new patient transition needs arise, please place a TOC consult.

## 2022-01-31 NOTE — Progress Notes (Signed)
Same-day note   Dean Wilson is a 61 y.o. male with past medical history of coronary artery disease, essential hypertension, GERD, obstructive sleep apnea, stroke presented to hospital with elevated heart rate while driving from [place] he had some chest tightness and palpitations.  He had missed a dose of Coreg.  Patient reported compliance with CPAP.  His symptoms lasted for around 45 minutes and improved blood was having dizziness diaphoresis.  Patient has history of atrial fibrillation diagnosed years ago.   In the ED, patient was noted to be mildly tachypneic, troponin was 27 followed by 634 followed by 2400.  Creatinine at baseline.  Negative for COVID and flu.  Chest x-ray was negative for acute disease.  Patient was given 500 mL bolus and cardiology was consulted for further evaluation and treatment.  Today, patient was seen and examined at bedside.  Denies current chest pain shortness of breath. Physical exam reveals a obese male.  Not in obvious distress.  Chest clear.  Known chest wall tenderness.  S1-S2 heard.  No peripheral edema.  Assessment and plan  Chest pain/palpitation with elevated troponin. With some palpitations with elevated troponins from 27> 634> 2141> 2405.  Cardiology on board.  History of CAD with stent to distal LAD.  Had a long discussion with the patient who had initially refused transfer to [facility] due to transportation costs.  At this time he is willing to be checked out for for his health.  EKG without acute ischemic changes.  Continue IV heparin.  Check 2D echocardiogram.  Communicated with Dr. Col[facility] regarding transfer.  He is going to arrange for [facility] and [facility].  Will keep the patient n.p.o. in case he would get catheter today.    Mixed hyperlipidemia - Continue Repatha at discharge   Essential hypertension - Continue Coreg, hydralazine, Aldactone   History of atrial fibrillation Not on medication at this time.  Was taken off in the  past.  Cardiology on board.  Cardiology recommends 30-day monitor as outpatient.  EKG KG nonacute.  Chronic systolic congestive heart failure (HCC) Previous 2D echocardiogram showed grade 1 diastolic heart failure with an ejection fraction of 60 to 65%.  Continue Coreg Crestor Entresto Aldactone Jardiance Repatha.  Cardiology on board.  Check 2D echocardiogram for wall motion abnormality/EF.

## 2022-01-31 NOTE — H&P (View-Only) (Signed)
Cardiology Consultation   Patient ID: Dean Wilson MRN: [id number]; DOB: [birth year]  Admit date: 01/30/2022 Date of Consult: 01/31/2022  PCP:  Dean Chroman, MD   [facility] Cardiologist:  Dean Lesches, MD   {  Patient Profile:   Dean Wilson is a 61 y.o. male with a hx of multiple small cortical/subcortical infarcts diagnosed in March 2022 at which point LVEF was 45% now with improved EF, ischemic cardiomyopathy, CAD status post DES to the mid LAD in April 2022, HLD, OSA on CPAP who is being seen 01/31/2022 for the evaluation of palpitations, elevated heart rate and elevated troponin at the request of Dr. Louanne Wilson.  History of Present Illness:   Dean Wilson was admitted in outside hospital 04/24/2020 with acute CVA symptoms found to have multiple small acute cortical/subcortical infarcts within posterior right frontal lobe and right parietal occipital lobes.  He also had evidence of a small chronic right cerebellar infarct.  During his workup he had an echo with an EF 45%, normal RV, normal bubble study, grade 1 diastolic dysfunction, normal valves, collapsible IVC.  Carotid duplex without significant stenosis.  Follow-up heart monitor showed no obvious atrial arrhythmias.  He was admitted to [facility] in April 2022 with heart failure.  He had a repeat echo which demonstrated EF 40 to 45% and possible LV thrombus with follow-up cardiac MRI recommended.  Follow-up MRI suggested ischemic heart disease with less than 50% LGE of the apical and basal lateral and inferior walls.  He was started on guideline directed medical therapy and discharged with close follow-up.  Cardiac MRI in April 2022 showed an EF of 32%, LGE consistent with prior myocardial infarction in the basal to apical lateral wall and mid inferior wall.  LGE was less than 50% transmural in these regions.  He was seen in the clinic and was arranged to undergo cardiac catheterization.  Cardiac  cath showed LAD disease and underwent PCI to the mid to distal LAD.  Repeat echo in June 2022 showed EF of 55 to 32%, grade 1 diastolic dysfunction, normal RV, mild MR.  Patient was last seen in July 2023 was overall stable from a cardiac perspective.  Repeat echo was ordered.  This showed LVEF 60 to 20%, grade 1 diastolic dysfunction, normal RV SF, trivial MR.  Patient presented to [facility] on 01/30/2022 with elevated heart rate. He was traveling back from [place], where he stayed for the holidays.  He had a caffeinated drink, which was abnormal for him.  He was also on prednisone and antibiotics for a sinus infection.  At the end of the trip he started feeling palpitations and elevated heart rate.  He noted a heart rate of 147.  He felt chest discomfort and shortness of breath.  He also had some dizziness and lightheadedness.  Episode lasted for about 40 minutes before self resolving.  He had no further episodes.  Decided to go to the ER for further workup.  In the ER blood pressure 141/84,  afebrile, respiratory rate 10, 97% O2.  EKG showed SVT with heart rates up to 140s.  Labs showed serum creatinine 1.59, BUN 30.  High-sensitivity troponin 27, 634.  Chest x-ray is negative.  Patient was given IV fluids.  Patient was admitted for further workup.  Patient denies active chest pain.  Says he is feeling back to normal.  He reports prior episode of SVT 20 years ago.  He reports compliance with medications.  He  reports occasional alcohol use.  No smoking history.  He exercises 3 times weekly.  He reports compliance with CPAP.   Past Medical History:  Diagnosis Date   CAD (coronary artery disease)    DES to mid LAD April 2022   Essential hypertension    GERD (gastroesophageal reflux disease)    History of cardiomyopathy    OSA on CPAP    PSVT (paroxysmal supraventricular tachycardia)    Stroke [facility])     Past Surgical History:  Procedure Laterality Date   CORONARY STENT  INTERVENTION N/A 05/20/2020   Procedure: CORONARY STENT INTERVENTION;  Surgeon: Dean Blanks, MD;  Location: [place] [facility];  Service: Cardiovascular;  Laterality: N/A;   RIGHT/LEFT HEART CATH AND CORONARY ANGIOGRAPHY N/A 05/20/2020   Procedure: RIGHT/LEFT HEART CATH AND CORONARY ANGIOGRAPHY;  Surgeon: Dean Artist, MD;  Location: [place] [facility];  Service: Cardiovascular;  Laterality: N/A;   UMBILICAL HERNIA REPAIR       Home Medications:  Prior to Admission medications   Medication Sig Start Date End Date Taking? Authorizing Provider  amLODipine (NORVASC) 5 MG tablet Take 1 tablet by mouth daily. 05/16/20  Yes [provider]  amoxicillin-clavulanate (AUGMENTIN) 500-125 MG tablet Take 1 tablet by mouth 2 (two) times daily. 01/23/22  Yes [provider]  ASPIRIN LOW DOSE 81 MG EC tablet Take 81 mg by mouth daily. 04/29/20  Yes [provider]  carvedilol (COREG) 25 MG tablet Take 25 mg by mouth 2 (two) times daily. 04/20/20  Yes [provider]  clopidogrel (PLAVIX) 75 MG tablet Take 75 mg by mouth daily. 04/29/20  Yes [provider]  DENTA 5000 PLUS 1.1 % CREA dental cream Take 1 application by mouth at bedtime. 04/21/20  Yes [provider]  empagliflozin (JARDIANCE) 10 MG TABS tablet Take 1 tablet (10 mg total) by mouth daily before breakfast. 09/08/20  Yes Wilson, Dean Pascal, MD  Evolocumab (REPATHA) 140 MG/ML SOSY Inject 140 mg into the skin every 14 (fourteen) days. 12/04/21  Yes Dean Sark, MD  hydrALAZINE (APRESOLINE) 25 MG tablet Take 25 mg by mouth 3 (three) times daily.   Yes [provider]  Multiple Vitamin (MULTIVITAMIN) tablet Take 1 tablet by mouth daily.   Yes [provider]  pantoprazole (PROTONIX) 40 MG tablet Take 1 tablet (40 mg total) by mouth daily. 01/26/11 01/30/22 Yes Dean Wilson, Dean Dawley, MD  predniSONE (DELTASONE) 10 MG tablet Take 10 mg by mouth 2 (two) times daily.  01/23/22  Yes [provider]  rosuvastatin (CRESTOR) 40 MG tablet Take 1 tablet (40 mg total) by mouth daily. 11/07/20  Yes Dean Wilson  sacubitril-valsartan (ENTRESTO) 97-103 MG Take 1 tablet by mouth 2 (two) times daily. 12/20/20  Yes Wilson, Dean Pascal, MD  spironolactone (ALDACTONE) 25 MG tablet Take 1 tablet (25 mg total) by mouth daily. 05/03/21  Yes Wilson, Dean Pascal, MD  torsemide (DEMADEX) 20 MG tablet Take 1 tablet (20 mg total) by mouth daily as needed. 09/08/20  Yes Wilson, Dean Pascal, MD  meclizine (ANTIVERT) 25 MG tablet Take 1 tablet (25 mg total) by mouth 3 (three) times daily as needed for up to 15 doses for dizziness. 10/07/20   Luna Fuse, MD    Inpatient Medications: Scheduled Meds:  amLODipine  5 mg Oral Daily   carvedilol  25 mg Oral BID   clopidogrel  75 mg Oral Daily   empagliflozin  10 mg Oral QAC breakfast  hydrALAZINE  25 mg Oral TID   pantoprazole  40 mg Oral Daily   sacubitril-valsartan  1 tablet Oral BID   spironolactone  25 mg Oral Daily   Continuous Infusions:  heparin 1,500 Units/hr (01/31/22 0139)   PRN Meds: acetaminophen, ondansetron (ZOFRAN) IV, ondansetron (ZOFRAN) IV  Allergies:    Allergies  Allergen Reactions   Banana Itching   Sunflower Oil Itching    Seeds not oil   Tomato Itching    Social History:   Social History   Socioeconomic History   Marital status: Married    Spouse name: Luciano Cutter   Number of children: 2   Years of education: Not on file   Highest education level: High school graduate  Occupational History   Occupation: Primary school teacher    Employer: [employer]  Tobacco Use   Smoking status: Former    Packs/day: 2.00    Years: 25.00    Total pack years: 50.00    Types: Cigarettes    Quit date: 01/26/2008    Years since quitting: 14.0   Smokeless tobacco: Never  Vaping Use   Vaping Use: Never used  Substance and Sexual Activity   Alcohol use: Never   Drug use: No   Sexual  activity: Yes    Birth control/protection: None  Other Topics Concern   Not on file  Social History Narrative   Not on file   Social Determinants of Health   Financial Resource Strain: Low Risk  (05/06/2020)   Overall Financial Resource Strain (CARDIA)    Difficulty of Paying Living Expenses: Not very hard  Food Insecurity: No Food Insecurity (05/06/2020)   Hunger Vital Sign    Worried About Running Out of Food in the Last Year: Never true    [place] in the Last Year: Never true  Transportation Needs: No Transportation Needs (05/06/2020)   PRAPARE - Hydrologist (Medical): No    Lack of Transportation (Non-Medical): No  Physical Activity: Not on file  Stress: Not on file  Social Connections: Not on file  Intimate Partner Violence: Not on file    Family History:    Family History  Problem Relation Age of Onset   Anesthesia problems Neg Hx    Hypotension Neg Hx    Malignant hyperthermia Neg Hx    Pseudochol deficiency Neg Hx      ROS:  Please see the history of present illness.   All other ROS reviewed and negative.     Physical Exam/Data:   Vitals:   01/31/22 0430 01/31/22 0530 01/31/22 0714 01/31/22 0716  BP: (!) 119/95 (!) 91/56 (!) 148/81   Pulse: (!) 57 (!) 52 (!) 59   Resp: (!) '23 12 16   '$ Temp:    97.6 F (36.4 C)  TempSrc:    Oral  SpO2: 100% 98% 96%   Weight:      Height:        Intake/Output Summary (Last 24 hours) at 01/31/2022 0827 Last data filed at 01/30/2022 2332 Gross per 24 hour  Intake 500 ml  Output --  Net 500 ml      01/31/2022    1:00 AM 01/30/2022    3:30 PM 08/09/2021   10:35 AM  Last 3 Weights  Weight (lbs) 262 lb 9.1 oz 262 lb 9.6 oz 259 lb 12.8 oz  Weight (kg) 119.1 kg 119.115 kg 117.845 kg     Body mass index is 35.61  kg/m.  General:  Well nourished, well developed, in no acute distress HEENT: normal Neck: no JVD Vascular: No carotid bruits; Distal pulses 2+ bilaterally Cardiac:  normal  S1, S2; RRR; no murmur  Lungs:  clear to auscultation bilaterally, no wheezing, rhonchi or rales  Abd: soft, nontender, no hepatomegaly  Ext: no edema Musculoskeletal:  No deformities, BUE and BLE strength normal and equal Skin: warm and dry  Neuro:  CNs 2-12 intact, no focal abnormalities noted Psych:  Normal affect   EKG:  The EKG was personally reviewed and demonstrates:  SVT 140bpm Telemetry:  Telemetry was personally reviewed and demonstrates:  unable to review at this time  Relevant CV Studies:  Cardiac monitor 06/20/2020 Study Highlights     Patch Wear Time:  4 days and 16 hours (2022-04-13T10:53:27-0400 to 2022-04-18T03:36:31-0400)   1. Sinus rhythm - avg HR of 69 bpm.  2. Rare PACs and PVCs.   Normal monitor.    Glori Bickers, MD  11:21 PM       Echocardiogram 08/25/2020  1. Left ventricular ejection fraction, by estimation, is 55 to 60%. The left ventricle has normal function. Left ventricular endocardial border not optimally defined to evaluate regional wall motion. There is mild left ventricular hypertrophy. Left ventricular diastolic parameters are consistent with Grade I diastolic dysfunction (impaired relaxation). 2. Right ventricular systolic function is normal. The right ventricular size is normal. Tricuspid regurgitation signal is inadequate for assessing PA pressure. 3. The mitral valve is abnormal. Mild mitral valve regurgitation. No evidence of mitral stenosis. 4. The aortic valve has an indeterminant number of cusps. Aortic valve regurgitation is not visualized. No aortic stenosis is present. 5. Visualized portion of the abdominal aorta appears dilated at 3.5 cm. 6. The inferior vena cava is normal in size with greater than 50% respiratory variability, suggesting right atrial pressure of 3 mmHg. Comparison(s): ECHO 05/2020 with EF 45%, G1DD, no valvular disease, normal RV.           Cardiac catheterization / PCI 05/20/2020 Conclusion     Mid  RCA lesion is 40% stenosed. Dist RCA lesion is 75% stenosed with 75% stenosed side branch in RPDA. 2nd Mrg lesion is 100% stenosed. Ost Cx to Prox Cx lesion is 30% stenosed. Dist LAD lesion is 95% stenosed. Ost LAD to Prox LAD lesion is 50% stenosed. Mid LAD lesion is 50% stenosed. 1st Diag lesion is 95% stenosed.   Findings:   Ao = 149/78 (104) LV =  139/10 RA =  3 RV = 19/3 PA = 21/9 (14) PCW = 4 Fick cardiac output/index = 6.2/2.5 PVR = 1.5 WU Ao sat = 99% PA sat = 72%, 76%   Assessment: 1. 3v CAD 2. EF 40-45% 3. Well compensated hemodynamics   Plan/Discussion:   Plan PCI of LAD. Distal RCA borderline significant but does not feed much myocardium. Will treat that lesion medically for now unless he develops angina.    Diagnostic Dominance: Right      Intervention       Conclusion     Prox LAD lesion is 50% stenosed. Mid LAD lesion is 50% stenosed. Dist LAD lesion is 95% stenosed. A drug-eluting stent was successfully placed using a SYNERGY XD 2.75X28. Post intervention, there is a 0% residual stenosis.   1. Severe stenosis mid LAD 2. Successful PTCA/DES x 1 mid LAD 3. See Dr. Clayborne Dana report from the diagnostic cath for further details of the other vessels   Recommendations: DAPT with ASA and Plavix for  at least six months. Same day post PCI discharge   Diagnostic Dominance: Right      Intervention            Echocardiogram 05/05/2020 1.Apical images concerning for LV thrombus. GIven drop in EF and recent CVA, echo contrast performed. On echo images, there is no definite thrombus. There appears to be trabeculation/musculature at the apex, but there is also slow moving contrast at the true apex. Given drop in EF and abnormal LV features, would consider cardiac MRI.Marland Kitchen Left ventricular ejection fraction, by estimation, is 40 to 45%. The left ventricle has mildly decreased function. The left ventricle demonstrates global hypokinesis. The left  ventricular internal cavity size was mildly dilated. There is moderate concentric left ventricular hypertrophy. Left ventricular diastolic parameters are indeterminate. Elevated left ventricular end-diastolic pressure. 2. Right ventricular systolic function is normal. The right ventricular size is normal. 3. A small pericardial effusion is present. 4. The mitral valve is grossly normal. Mild mitral valve regurgitation. 5. The aortic valve is grossly normal. Aortic valve regurgitation is not visualized. 6. The inferior vena cava is normal in size with greater than 50% respiratory variability, suggesting right atrial pressure of 3 mmHg. Comparison(s): Prior images unable to be directly viewed, comparison made by report only.  Laboratory Data:  High Sensitivity Troponin:   Recent Labs  Lab 01/30/22 1619 01/30/22 1838 01/30/22 2051 01/31/22 0118  TROPONINIHS 27* 634* 2,141* 2,405*     Chemistry Recent Labs  Lab 01/30/22 1619 01/30/22 2051 01/31/22 0118  NA 139  --  140  K 4.1  --  3.5  CL 104  --  110  CO2 23  --  23  GLUCOSE 108*  --  122*  BUN 30*  --  29*  CREATININE 1.59*  --  1.31*  CALCIUM 8.8*  --  8.6*  MG  --  2.3  --   GFRNONAA 49*  --  >60  ANIONGAP 12  --  7    Recent Labs  Lab 01/31/22 0118  PROT 6.2*  ALBUMIN 3.4*  AST 21  ALT 15  ALKPHOS 45  BILITOT 0.6   Lipids  Recent Labs  Lab 01/31/22 0118  CHOL 110  TRIG 150*  HDL 61  LDLCALC 19  CHOLHDL 1.8    Hematology Recent Labs  Lab 01/30/22 1619 01/30/22 2051 01/31/22 0118  WBC 10.7* 12.9* 10.2  RBC 5.74 5.52 5.58  HGB 16.0 15.4 15.6  HCT 50.2 47.4 48.3  MCV 87.5 85.9 86.6  MCH 27.9 27.9 28.0  MCHC 31.9 32.5 32.3  RDW 15.0 14.7 14.8  PLT 231 207 201   Thyroid  Recent Labs  Lab 01/30/22 1619  TSH 1.055    BNPNo results for input(s): "BNP", "PROBNP" in the last 168 hours.  DDimer  Recent Labs  Lab 01/30/22 1619  DDIMER <0.27     Radiology/Studies:  [facility] Chest Port 1  View  Result Date: 01/30/2022 CLINICAL DATA:  Chest pain. EXAM: PORTABLE CHEST 1 VIEW COMPARISON:  May 04, 2020. FINDINGS: The heart size and mediastinal contours are within normal limits. Both lungs are clear. The visualized skeletal structures are unremarkable. IMPRESSION: No active disease. Electronically Signed   By: Marijo Conception M.D.   On: 01/30/2022 16:43     Assessment and Plan:   Palpitations/chest discomfort pSVT -Patient presents with palpitations/elevated heart rate to the 140s with associated chest discomfort for 40 minutes.  He reports he had a caffeinated drink, which was abnormal for  him.  He was also on prednisone and an antibiotic for sinus infection -Labs showed mild dehydration> he was given IV fluids -D-dimer normal -TSH normal -Keep potassium greater than 4 and mag greater than 2 -Patient reports history of SVT, which is likely the culprit -Troponin elevated to the 2405, plan for echo.  He is on IV heparin -Unable to review telemetry at this time.  Continue telemetry -Patient will need heart monitor at discharge  Elevated troponin CAD s/p DES LAD 05/2020 -High-sensitivity troponin elevated to 2405 he was started on IV heparin -He reports chest discomfort in the setting of palpitations/elevated heart rate -No active chest pain -Continue IV heparin x 48 hours -Continue to trend troponin -Recheck an echo -Given history of CAD may require further ischemic workup, will discuss with MD.  Patient is not wanting to travel to [facility]. -Continue aspirin 81 mg daily and Plavix 75 mg daily.  Continue Repatha and Crestor.  HFimpEF ICM -EF as low as 45% in the past by echo 04/2020 -EF improved by echo July showing LVEF 55 to 50%, grade 1 diastolic dysfunction -Post recent echo in July 2023 showed EF 60 to 65% - Continue Coreg 25 mg twice daily -Continue Jardiance 10 mg daily -Continue spironolactone 25 mg daily -Continue Entresto 97-23 mg twice daily -Patient takes  torsemide 20 mg as needed for swelling  Hyperlipidemia -Lipid panel this admission showed triglycerides 150, HDL 61, LDL 19 -Continue Repatha and Crestor  OSA -He reports compliance with CPAP  History of stroke in March 2022 -Patient is on dual antiplatelet therapy and cholesterol medication  For questions or updates, please contact [place] Please consult [email or web address] for contact info under    Signed, Cadence Arlyss Repress  01/31/2022 8:27 AM   Patient examined chart reviewed Discussed care with patient and PA Exam with overweight white male no distress. Lungs clear no murmur abdomen benign good pulses and trace edema. ECG non acute Troponin elevated > 2000. Known CAD with stent to distal LAD and residual distal RCA/D1 dx. He refused to be transferred to [facility] for cath. "My insurance will not cover that"  Admit to [facility].  Check echo as prior EF was normal. Heparin Telemetry to monitor for SVT/PAF. Will need ouptatient 30 day monitor If rhythm stable, EF preserved and no further arrhythmia or chest pain possible d/c in am and can consider arranging outpatient cath Outpatient f/u will be with Dr Domenic Polite  If EF down or mor angina with arrhythmia will try to convince him to go to [facility] for further care Likely demand ischemia in setting of tachycardia and known CAD  Jenkins Rouge MD [facility]

## 2022-01-31 NOTE — Assessment & Plan Note (Signed)
Patient reported to admitting MD he had a "history of atrial fibrillation" and was "taken off of medication for atrial fibrillation years ago", but Cardiology notes indicate he had a remote PSVT, after his embolic stroke in 3391, had a Zio patch with no atrial arrhythmias.

## 2022-01-31 NOTE — ED Notes (Signed)
ED TO INPATIENT HANDOFF REPORT  ED Nurse Name and Phone #: [phone]  S Name/Age/Gender Dean Wilson 61 y.o. male Room/Bed: APOTF/OTF  Code Status   Code Status: Full Code  Home/SNF/Other Home Patient oriented to: self, place, time, and situation Is this baseline? Yes   Triage Complete: Triage complete  Chief Complaint Chest pain [R07.9]  Triage Note Pt BIB wife, began having SHOB, increased pulse rate, radiates to L arm, cold sweats. Hx of stroke, heart attack.    Allergies Allergies  Allergen Reactions   Banana Itching   Sunflower Oil Itching    Seeds not oil   Tomato Itching    Level of Care/Admitting Diagnosis ED Disposition     ED Disposition  Admit   Condition  --   [place]: [facility] [[id number]]  Level of Care: Telemetry [5]  Covid Evaluation: Asymptomatic - no recent exposure (last 10 days) testing not required  Diagnosis: Chest pain [841660]  Admitting Physician: Rolla Plate [[id number]]  Attending Physician: Rolla Plate [[id number]]          B Medical/Surgery History Past Medical History:  Diagnosis Date   CAD (coronary artery disease)    DES to mid LAD April 2022   Essential hypertension    GERD (gastroesophageal reflux disease)    History of cardiomyopathy    OSA on CPAP    PSVT (paroxysmal supraventricular tachycardia)    Stroke [facility])    Past Surgical History:  Procedure Laterality Date   CORONARY STENT INTERVENTION N/A 05/20/2020   Procedure: CORONARY STENT INTERVENTION;  Surgeon: Burnell Blanks, MD;  Location: [place] CV LAB;  Service: Cardiovascular;  Laterality: N/A;   RIGHT/LEFT HEART CATH AND CORONARY ANGIOGRAPHY N/A 05/20/2020   Procedure: RIGHT/LEFT HEART CATH AND CORONARY ANGIOGRAPHY;  Surgeon: Jolaine Artist, MD;  Location: [place] CV LAB;  Service: Cardiovascular;  Laterality: N/A;   UMBILICAL HERNIA REPAIR       A IV Location/Drains/Wounds Patient  Lines/Drains/Airways Status     Active Line/Drains/Airways     Name Placement date Placement time Site Days   Peripheral IV 01/30/22 20 G Anterior;Distal;Left;Upper Arm 01/30/22  1958  Arm  1            Intake/Output Last 24 hours  Intake/Output Summary (Last 24 hours) at 01/31/2022 0809 Last data filed at 01/30/2022 2332 Gross per 24 hour  Intake 500 ml  Output --  Net 500 ml    Labs/Imaging Results for orders placed or performed during the hospital encounter of 01/30/22 (from the past 48 hour(s))  Resp panel by RT-PCR (RSV, Flu A&B, Covid) Anterior Nasal Swab     Status: None   Collection Time: 01/30/22  3:30 PM   Specimen: Anterior Nasal Swab  Result Value Ref Range   SARS Coronavirus 2 by RT PCR NEGATIVE NEGATIVE    Comment: (NOTE) SARS-CoV-2 target nucleic acids are NOT DETECTED.  The SARS-CoV-2 RNA is generally detectable in upper respiratory specimens during the acute phase of infection. The lowest concentration of SARS-CoV-2 viral copies this assay can detect is 138 copies/mL. A negative result does not preclude SARS-Cov-2 infection and should not be used as the sole basis for treatment or other patient management decisions. A negative result may occur with  improper specimen collection/handling, submission of specimen other than nasopharyngeal swab, presence of viral mutation(s) within the areas targeted by this assay, and inadequate number of viral copies(<138 copies/mL). A negative result must be combined with  clinical observations, patient history, and epidemiological information. The expected result is Negative.  Fact Sheet for Patients:  EntrepreneurPulse.com.au  Fact Sheet for Healthcare Providers:  IncredibleEmployment.be  This test is no t yet approved or cleared by the [place] FDA and  has been authorized for detection and/or diagnosis of SARS-CoV-2 by FDA under an Emergency Use Authorization (EUA). This  EUA will remain  in effect (meaning this test can be used) for the duration of the COVID-19 declaration under Section 564(b)(1) of the Act, 21 U.S.C.section 360bbb-3(b)(1), unless the authorization is terminated  or revoked sooner.       Influenza A by PCR NEGATIVE NEGATIVE   Influenza B by PCR NEGATIVE NEGATIVE    Comment: (NOTE) The Xpert Xpress SARS-CoV-2/FLU/RSV plus assay is intended as an aid in the diagnosis of influenza from Nasopharyngeal swab specimens and should not be used as a sole basis for treatment. Nasal washings and aspirates are unacceptable for Xpert Xpress SARS-CoV-2/FLU/RSV testing.  Fact Sheet for Patients: EntrepreneurPulse.com.au  Fact Sheet for Healthcare Providers: IncredibleEmployment.be  This test is not yet approved or cleared by the [place] FDA and has been authorized for detection and/or diagnosis of SARS-CoV-2 by FDA under an Emergency Use Authorization (EUA). This EUA will remain in effect (meaning this test can be used) for the duration of the COVID-19 declaration under Section 564(b)(1) of the Act, 21 U.S.C. section 360bbb-3(b)(1), unless the authorization is terminated or revoked.     Resp Syncytial Virus by PCR NEGATIVE NEGATIVE    Comment: (NOTE) Fact Sheet for Patients: EntrepreneurPulse.com.au  Fact Sheet for Healthcare Providers: IncredibleEmployment.be  This test is not yet approved or cleared by the [place] FDA and has been authorized for detection and/or diagnosis of SARS-CoV-2 by FDA under an Emergency Use Authorization (EUA). This EUA will remain in effect (meaning this test can be used) for the duration of the COVID-19 declaration under Section 564(b)(1) of the Act, 21 U.S.C. section 360bbb-3(b)(1), unless the authorization is terminated or revoked.  Performed at [facility], [address], [place], [place] [address]   TSH     Status:  None   Collection Time: 01/30/22  4:19 PM  Result Value Ref Range   TSH 1.055 0.350 - 4.500 uIU/mL    Comment: Performed by a 3rd Generation assay with a functional sensitivity of <=0.01 uIU/mL. Performed at [facility], [address] [place], [place] [address]   Basic metabolic panel     Status: Abnormal   Collection Time: 01/30/22  4:19 PM  Result Value Ref Range   Sodium 139 135 - 145 mmol/L   Potassium 4.1 3.5 - 5.1 mmol/L   Chloride 104 98 - 111 mmol/L   CO2 23 22 - 32 mmol/L   Glucose, Bld 108 (H) 70 - 99 mg/dL    Comment: Glucose reference range applies only to samples taken after fasting for at least 8 hours.   BUN 30 (H) 8 - 23 mg/dL   Creatinine, Ser 1.59 (H) 0.61 - 1.24 mg/dL   Calcium 8.8 (L) 8.9 - 10.3 mg/dL   GFR, Estimated 49 (L) >60 mL/min    Comment: (NOTE) Calculated using the CKD-EPI Creatinine Equation (2021)    Anion gap 12 5 - 15    Comment: Performed at [facility], [address], [place], [place] [address]  CBC     Status: Abnormal   Collection Time: 01/30/22  4:19 PM  Result Value Ref Range   WBC 10.7 (H) 4.0 -  10.5 K/uL   RBC 5.74 4.22 - 5.81 MIL/uL   Hemoglobin 16.0 13.0 - 17.0 g/dL   HCT 50.2 39.0 - 52.0 %   MCV 87.5 80.0 - 100.0 fL   MCH 27.9 26.0 - 34.0 pg   MCHC 31.9 30.0 - 36.0 g/dL   RDW 15.0 11.5 - 15.5 %   Platelets 231 150 - 400 K/uL   nRBC 0.0 0.0 - 0.2 %    Comment: Performed at [facility], [address] [place], [place] [address]  D-dimer, quantitative     Status: None   Collection Time: 01/30/22  4:19 PM  Result Value Ref Range   D-Dimer, Quant <0.27 0.00 - 0.50 ug/mL-FEU    Comment: (NOTE) At the manufacturer cut-off value of 0.5 g/mL FEU, this assay has a negative predictive value of 95-100%.This assay is intended for use in conjunction with a clinical pretest probability (PTP) assessment model to exclude pulmonary embolism (PE) and deep venous thrombosis (DVT) in outpatients suspected of PE or DVT. Results should be  correlated with clinical presentation. Performed at [facility], [address], [place], [place] [address]   Troponin I (High Sensitivity)     Status: Abnormal   Collection Time: 01/30/22  4:19 PM  Result Value Ref Range   Troponin I (High Sensitivity) 27 (H) <18 ng/L    Comment: (NOTE) Elevated high sensitivity troponin I (hsTnI) values and significant  changes across serial measurements may suggest ACS but many other  chronic and acute conditions are known to elevate hsTnI results.  Refer to the "Links" section for chest pain algorithms and additional  guidance. Performed at [facility], [address], [place], [place] [address]   Troponin I (High Sensitivity)     Status: Abnormal   Collection Time: 01/30/22  6:38 PM  Result Value Ref Range   Troponin I (High Sensitivity) 634 (HH) <18 ng/L    Comment: DELTA CHECK NOTED CRITICAL RESULT CALLED TO, READ BACK BY AND VERIFIED WITH: BRAME,M ON 01/30/22 AT 1945 BY LOY,C (NOTE) Elevated high sensitivity troponin I (hsTnI) values and significant  changes across serial measurements may suggest ACS but many other  chronic and acute conditions are known to elevate hsTnI results.  Refer to the Links section for chest pain algorithms and additional  guidance. Performed at [facility], [address] [place], [place] [address]   CBC with Differential     Status: Abnormal   Collection Time: 01/30/22  8:51 PM  Result Value Ref Range   WBC 12.9 (H) 4.0 - 10.5 K/uL   RBC 5.52 4.22 - 5.81 MIL/uL   Hemoglobin 15.4 13.0 - 17.0 g/dL   HCT 47.4 39.0 - 52.0 %   MCV 85.9 80.0 - 100.0 fL   MCH 27.9 26.0 - 34.0 pg   MCHC 32.5 30.0 - 36.0 g/dL   RDW 14.7 11.5 - 15.5 %   Platelets 207 150 - 400 K/uL   nRBC 0.0 0.0 - 0.2 %   Neutrophils Relative % 65 %   Neutro Abs 8.6 (H) 1.7 - 7.7 K/uL   Lymphocytes Relative 22 %   Lymphs Abs 2.8 0.7 - 4.0 K/uL   Monocytes Relative 9 %   Monocytes Absolute 1.1 (H) 0.1 - 1.0 K/uL   Eosinophils Relative 2 %    Eosinophils Absolute 0.3 0.0 - 0.5 K/uL   Basophils Relative 1 %   Basophils Absolute 0.1 0.0 - 0.1 K/uL   Immature Granulocytes 1 %   Abs Immature  Granulocytes 0.09 (H) 0.00 - 0.07 K/uL    Comment: Performed at [facility], [address], [place], [place] [address]  Troponin I (High Sensitivity)     Status: Abnormal   Collection Time: 01/30/22  8:51 PM  Result Value Ref Range   Troponin I (High Sensitivity) 2,141 (HH) <18 ng/L    Comment: DELTA CHECK NOTED CRITICAL RESULT CALLED TO, READ BACK BY AND VERIFIED WITH: TEASLEY,E ON 01/31/2022 @ 0100 BY ACOSTA,A Performed at [facility], [address] [place], [place] [address]   Magnesium     Status: None   Collection Time: 01/30/22  8:51 PM  Result Value Ref Range   Magnesium 2.3 1.7 - 2.4 mg/dL    Comment: Performed at [facility], [address], [place], [place] [address]  Lipid panel     Status: Abnormal   Collection Time: 01/31/22  1:18 AM  Result Value Ref Range   Cholesterol 110 0 - 200 mg/dL   Triglycerides 150 (H) <150 mg/dL   HDL 61 >40 mg/dL   Total CHOL/HDL Ratio 1.8 RATIO   VLDL 30 0 - 40 mg/dL   LDL Cholesterol 19 0 - 99 mg/dL    Comment:        Total Cholesterol/HDL:CHD Risk Coronary Heart Disease Risk Table                     Men   Women  1/2 Average Risk   3.4   3.3  Average Risk       5.0   4.4  2 X Average Risk   9.6   7.1  3 X Average Risk  23.4   11.0        Use the calculated Patient Ratio above and the CHD Risk Table to determine the patient's CHD Risk.        ATP III CLASSIFICATION (LDL):  <100     mg/dL   Optimal  100-129  mg/dL   Near or Above                    Optimal  130-159  mg/dL   Borderline  160-189  mg/dL   High  >190     mg/dL   Very High Performed at [facility], [address] [place], [place] [address]   CBC     Status: None   Collection Time: 01/31/22  1:18 AM  Result Value Ref Range   WBC 10.2 4.0 - 10.5 K/uL   RBC 5.58 4.22 - 5.81 MIL/uL   Hemoglobin 15.6 13.0 - 17.0  g/dL   HCT 48.3 39.0 - 52.0 %   MCV 86.6 80.0 - 100.0 fL   MCH 28.0 26.0 - 34.0 pg   MCHC 32.3 30.0 - 36.0 g/dL   RDW 14.8 11.5 - 15.5 %   Platelets 201 150 - 400 K/uL   nRBC 0.0 0.0 - 0.2 %    Comment: Performed at [facility], [address], [place], [place] [address]  Comprehensive metabolic panel     Status: Abnormal   Collection Time: 01/31/22  1:18 AM  Result Value Ref Range   Sodium 140 135 - 145 mmol/L   Potassium 3.5 3.5 - 5.1 mmol/L   Chloride 110 98 - 111 mmol/L   CO2 23 22 - 32 mmol/L   Glucose, Bld 122 (H) 70 - 99 mg/dL    Comment: Glucose reference range applies only to samples taken after fasting for at least  8 hours.   BUN 29 (H) 8 - 23 mg/dL   Creatinine, Ser 1.31 (H) 0.61 - 1.24 mg/dL   Calcium 8.6 (L) 8.9 - 10.3 mg/dL   Total Protein 6.2 (L) 6.5 - 8.1 g/dL   Albumin 3.4 (L) 3.5 - 5.0 g/dL   AST 21 15 - 41 U/L   ALT 15 0 - 44 U/L   Alkaline Phosphatase 45 38 - 126 U/L   Total Bilirubin 0.6 0.3 - 1.2 mg/dL   GFR, Estimated >60 >60 mL/min    Comment: (NOTE) Calculated using the CKD-EPI Creatinine Equation (2021)    Anion gap 7 5 - 15    Comment: Performed at [facility], [address], [place], [place] [address]  Troponin I (High Sensitivity)     Status: Abnormal   Collection Time: 01/31/22  1:18 AM  Result Value Ref Range   Troponin I (High Sensitivity) 2,405 (HH) <18 ng/L    Comment: DELTA CHECK NOTED CRITICAL RESULT CALLED TO, READ BACK BY AND VERIFIED WITH: TEASLEY,E ON 01/31/2022 @ 0234 BY ACOSTA,A Performed at [facility], [address] [place], [place] [address]    DG Chest Port 1 View  Result Date: 01/30/2022 CLINICAL DATA:  Chest pain. EXAM: PORTABLE CHEST 1 VIEW COMPARISON:  May 04, 2020. FINDINGS: The heart size and mediastinal contours are within normal limits. Both lungs are clear. The visualized skeletal structures are unremarkable. IMPRESSION: No active disease. Electronically Signed   By: Marijo Conception M.D.   On: 01/30/2022 16:43     Pending Labs Unresulted Labs (From admission, onward)     Start     Ordered   02/01/22 0500  Heparin level (unfractionated)  Daily,   R     See Hyperspace for full Linked Orders Report.   01/31/22 0117   02/01/22 0500  CBC  Daily,   R     See Hyperspace for full Linked Orders Report.   01/31/22 0117   01/31/22 0800  Heparin level (unfractionated)  Once-Timed,   TIMED        01/31/22 0117   01/31/22 0500  HIV Antibody (routine testing w rflx)  Once,   R        01/31/22 0500   Pending  Basic metabolic panel  Once,   R        Pending   Pending  CBC  Once,   R        Pending            Vitals/Pain Today's Vitals   01/31/22 0430 01/31/22 0530 01/31/22 0714 01/31/22 0716  BP: (!) 119/95 (!) 91/56 (!) 148/81   Pulse: (!) 57 (!) 52 (!) 59   Resp: (!) '23 12 16   '$ Temp:    97.6 F (36.4 C)  TempSrc:    Oral  SpO2: 100% 98% 96%   Weight:      Height:      PainSc:        Isolation Precautions No active isolations  Medications Medications  amLODipine (NORVASC) tablet 5 mg (has no administration in time range)  carvedilol (COREG) tablet 25 mg (25 mg Oral Given 01/30/22 2345)  hydrALAZINE (APRESOLINE) tablet 25 mg (25 mg Oral Given by Other 01/30/22 2345)  sacubitril-valsartan (ENTRESTO) 97-103 mg per tablet (1 tablet Oral Given by Other 01/30/22 2346)  spironolactone (ALDACTONE) tablet 25 mg (has no administration in time range)  empagliflozin (JARDIANCE) tablet 10 mg (has no administration in time range)  pantoprazole (  PROTONIX) EC tablet 40 mg (has no administration in time range)  clopidogrel (PLAVIX) tablet 75 mg (has no administration in time range)  acetaminophen (TYLENOL) tablet 650 mg (has no administration in time range)  ondansetron (ZOFRAN) injection 4 mg (has no administration in time range)  ondansetron (ZOFRAN) injection 4 mg (has no administration in time range)  heparin ADULT infusion 100 units/mL (25000 units/232m) (1,500 Units/hr Intravenous New  Bag/Given 01/31/22 0139)  sodium chloride 0.9 % bolus 500 mL (0 mLs Intravenous Stopped 01/30/22 2332)  heparin bolus via infusion 4,000 Units (4,000 Units Intravenous Bolus from Bag 01/31/22 0139)    Mobility walks Low fall risk   Focused Assessments    R Recommendations: See Admitting Provider Note  Report given to:   Additional Notes:

## 2022-01-31 NOTE — Assessment & Plan Note (Signed)
-   Patient had chest tightness that was associated with palpitations - When the palpitations went away so did the chest pain - He has significant elevation in troponin from 27> 634> 2141> 2405 - When cardiology was first consulted he had only gone from 27-6 34 and they did not recommend heparin at that time - As troponin continue to increase heparin was started, cardiology consult placed - Monitor on telemetry - EKG was difficult to interpret with cardiology stating that could be atrial flutter or SVT.  There were no acute ischemic changes - Patient does report a history of atrial fibrillation - Echo in the a.m. - Continue to monitor

## 2022-01-31 NOTE — Assessment & Plan Note (Signed)
-   Continue Repatha at discharge

## 2022-01-31 NOTE — Progress Notes (Signed)
*  PRELIMINARY RESULTS* Echocardiogram 2D Echocardiogram has been performed.  Dean Wilson 01/31/2022, 10:32 AM

## 2022-01-31 NOTE — Progress Notes (Signed)
ANTICOAGULATION CONSULT NOTE - Initial Consult  Pharmacy Consult for heparin Indication: chest pain/ACS  Allergies  Allergen Reactions   Banana Itching   Sunflower Oil Itching    Seeds not oil   Tomato Itching    Patient Measurements: Height: 6' (182.9 cm) Weight: 119.1 kg (262 lb 9.6 oz) IBW/kg (Calculated) : 77.6 Heparin Dosing Weight: 105kg  Vital Signs: Temp: 97.5 F (36.4 C) (12/26 1856) Temp Source: Axillary (12/26 1856) BP: 114/55 (12/26 2230) Pulse Rate: 58 (12/26 2230)  Labs: Recent Labs    01/30/22 1619 01/30/22 1838 01/30/22 2051  HGB 16.0  --  15.4  HCT 50.2  --  47.4  PLT 231  --  207  CREATININE 1.59*  --   --   TROPONINIHS 27* 634* 2,141*    Estimated Creatinine Clearance: 64.2 mL/min (A) (by C-G formula based on SCr of 1.59 mg/dL (H)).   Medical History: Past Medical History:  Diagnosis Date   CAD (coronary artery disease)    DES to mid LAD April 2022   Essential hypertension    GERD (gastroesophageal reflux disease)    History of cardiomyopathy    OSA on CPAP    PSVT (paroxysmal supraventricular tachycardia)    Stroke [facility])     Assessment: 61yo male c/o SOB, cold sweats, and palpitations, found to be in ST/SVT which converted to NSR without intervention, sx improved but now troponin elevated and rising sharply (82>500>3704) >> to begin heparin.  Goal of Therapy:  Heparin level 0.3-0.7 units/ml Monitor platelets by anticoagulation protocol: Yes   Plan:  Heparin 4000 units IV bolus x1 followed by infusion at 1500 units/hr. Monitor heparin levels an CBC.  Wynona Neat, PharmD, BCPS  01/31/2022,1:09 AM

## 2022-01-31 NOTE — Consult Note (Addendum)
Cardiology Consultation   Patient ID: Dean Wilson MRN: [id number]; DOB: 10/17/1960  Admit date: 01/30/2022 Date of Consult: 01/31/2022  PCP:  Glenda Chroman, MD   [facility] Cardiologist:  Rozann Lesches, MD   {  Patient Profile:   Dean Wilson is a 61 y.o. male with a hx of multiple small cortical/subcortical infarcts diagnosed in March 2022 at which point LVEF was 45% now with improved EF, ischemic cardiomyopathy, CAD status post DES to the mid LAD in April 2022, HLD, OSA on CPAP who is being seen 01/31/2022 for the evaluation of palpitations, elevated heart rate and elevated troponin at the request of Dr. Louanne Belton.  History of Present Illness:   [name] was admitted in outside hospital 04/24/2020 with acute CVA symptoms found to have multiple small acute cortical/subcortical infarcts within posterior right frontal lobe and right parietal occipital lobes.  He also had evidence of a small chronic right cerebellar infarct.  During his workup he had an echo with an EF 45%, normal RV, normal bubble study, grade 1 diastolic dysfunction, normal valves, collapsible IVC.  Carotid duplex without significant stenosis.  Follow-up heart monitor showed no obvious atrial arrhythmias.  He was admitted to [facility] in April 2022 with heart failure.  He had a repeat echo which demonstrated EF 40 to 45% and possible LV thrombus with follow-up cardiac MRI recommended.  Follow-up MRI suggested ischemic heart disease with less than 50% LGE of the apical and basal lateral and inferior walls.  He was started on guideline directed medical therapy and discharged with close follow-up.  Cardiac MRI in April 2022 showed an EF of 32%, LGE consistent with prior myocardial infarction in the basal to apical lateral wall and mid inferior wall.  LGE was less than 50% transmural in these regions.  He was seen in the clinic and was arranged to undergo cardiac catheterization.  Cardiac  cath showed LAD disease and underwent PCI to the mid to distal LAD.  Repeat echo in June 2022 showed EF of 55 to 42%, grade 1 diastolic dysfunction, normal RV, mild MR.  Patient was last seen in July 2023 was overall stable from a cardiac perspective.  Repeat echo was ordered.  This showed LVEF 60 to 59%, grade 1 diastolic dysfunction, normal RV SF, trivial MR.  Patient presented to [facility] on 01/30/2022 with elevated heart rate. He was traveling back from [place], where he stayed for the holidays.  He had a caffeinated drink, which was abnormal for him.  He was also on prednisone and antibiotics for a sinus infection.  At the end of the trip he started feeling palpitations and elevated heart rate.  He noted a heart rate of 147.  He felt chest discomfort and shortness of breath.  He also had some dizziness and lightheadedness.  Episode lasted for about 40 minutes before self resolving.  He had no further episodes.  Decided to go to the ER for further workup.  In the ER blood pressure 141/84,  afebrile, respiratory rate 10, 97% O2.  EKG showed SVT with heart rates up to 140s.  Labs showed serum creatinine 1.59, BUN 30.  High-sensitivity troponin 27, 634.  Chest x-ray is negative.  Patient was given IV fluids.  Patient was admitted for further workup.  Patient denies active chest pain.  Says he is feeling back to normal.  He reports prior episode of SVT 20 years ago.  He reports compliance with medications.  He  reports occasional alcohol use.  No smoking history.  He exercises 3 times weekly.  He reports compliance with CPAP.   Past Medical History:  Diagnosis Date   CAD (coronary artery disease)    DES to mid LAD April 2022   Essential hypertension    GERD (gastroesophageal reflux disease)    History of cardiomyopathy    OSA on CPAP    PSVT (paroxysmal supraventricular tachycardia)    Stroke [facility])     Past Surgical History:  Procedure Laterality Date   CORONARY STENT  INTERVENTION N/A 05/20/2020   Procedure: CORONARY STENT INTERVENTION;  Surgeon: Burnell Blanks, MD;  Location: [facility];  Service: Cardiovascular;  Laterality: N/A;   RIGHT/LEFT HEART CATH AND CORONARY ANGIOGRAPHY N/A 05/20/2020   Procedure: RIGHT/LEFT HEART CATH AND CORONARY ANGIOGRAPHY;  Surgeon: Jolaine Artist, MD;  Location: [facility];  Service: Cardiovascular;  Laterality: N/A;   UMBILICAL HERNIA REPAIR       Home Medications:  Prior to Admission medications   Medication Sig Start Date End Date Taking? Authorizing Provider  amLODipine (NORVASC) 5 MG tablet Take 1 tablet by mouth daily. 05/16/20  Yes [provider]  amoxicillin-clavulanate (AUGMENTIN) 500-125 MG tablet Take 1 tablet by mouth 2 (two) times daily. 01/23/22  Yes [provider]  ASPIRIN LOW DOSE 81 MG EC tablet Take 81 mg by mouth daily. 04/29/20  Yes [provider]  carvedilol (COREG) 25 MG tablet Take 25 mg by mouth 2 (two) times daily. 04/20/20  Yes [provider]  clopidogrel (PLAVIX) 75 MG tablet Take 75 mg by mouth daily. 04/29/20  Yes [provider]  DENTA 5000 PLUS 1.1 % CREA dental cream Take 1 application by mouth at bedtime. 04/21/20  Yes [provider]  empagliflozin (JARDIANCE) 10 MG TABS tablet Take 1 tablet (10 mg total) by mouth daily before breakfast. 09/08/20  Yes Bensimhon, Shaune Pascal, MD  Evolocumab (REPATHA) 140 MG/ML SOSY Inject 140 mg into the skin every 14 (fourteen) days. 12/04/21  Yes Satira Sark, MD  hydrALAZINE (APRESOLINE) 25 MG tablet Take 25 mg by mouth 3 (three) times daily.   Yes [provider]  Multiple Vitamin (MULTIVITAMIN) tablet Take 1 tablet by mouth daily.   Yes [provider]  pantoprazole (PROTONIX) 40 MG tablet Take 1 tablet (40 mg total) by mouth daily. 01/26/11 01/30/22 Yes Rehman, Mechele Dawley, MD  predniSONE (DELTASONE) 10 MG tablet Take 10 mg by mouth 2 (two) times daily.  01/23/22  Yes [provider]  rosuvastatin (CRESTOR) 40 MG tablet Take 1 tablet (40 mg total) by mouth daily. 11/07/20  Yes Verta Ellen., NP  sacubitril-valsartan (ENTRESTO) 97-103 MG Take 1 tablet by mouth 2 (two) times daily. 12/20/20  Yes Bensimhon, Shaune Pascal, MD  spironolactone (ALDACTONE) 25 MG tablet Take 1 tablet (25 mg total) by mouth daily. 05/03/21  Yes Bensimhon, Shaune Pascal, MD  torsemide (DEMADEX) 20 MG tablet Take 1 tablet (20 mg total) by mouth daily as needed. 09/08/20  Yes Bensimhon, Shaune Pascal, MD  meclizine (ANTIVERT) 25 MG tablet Take 1 tablet (25 mg total) by mouth 3 (three) times daily as needed for up to 15 doses for dizziness. 10/07/20   Luna Fuse, MD    Inpatient Medications: Scheduled Meds:  amLODipine  5 mg Oral Daily   carvedilol  25 mg Oral BID   clopidogrel  75 mg Oral Daily   empagliflozin  10 mg Oral QAC breakfast  hydrALAZINE  25 mg Oral TID   pantoprazole  40 mg Oral Daily   sacubitril-valsartan  1 tablet Oral BID   spironolactone  25 mg Oral Daily   Continuous Infusions:  heparin 1,500 Units/hr (01/31/22 0139)   PRN Meds: acetaminophen, ondansetron (ZOFRAN) IV, ondansetron (ZOFRAN) IV  Allergies:    Allergies  Allergen Reactions   Banana Itching   Sunflower Oil Itching    Seeds not oil   Tomato Itching    Social History:   Social History   Socioeconomic History   Marital status: Married    Spouse name: Luciano Cutter   Number of children: 2   Years of education: Not on file   Highest education level: High school graduate  Occupational History   Occupation: Primary school teacher    Employer: [employer]  Tobacco Use   Smoking status: Former    Packs/day: 2.00    Years: 25.00    Total pack years: 50.00    Types: Cigarettes    Quit date: 01/26/2008    Years since quitting: 14.0   Smokeless tobacco: Never  Vaping Use   Vaping Use: Never used  Substance and Sexual Activity   Alcohol use: Never   Drug use: No   Sexual  activity: Yes    Birth control/protection: None  Other Topics Concern   Not on file  Social History Narrative   Not on file   Social Determinants of Health   Financial Resource Strain: Low Risk  (05/06/2020)   Overall Financial Resource Strain (CARDIA)    Difficulty of Paying Living Expenses: Not very hard  Food Insecurity: No Food Insecurity (05/06/2020)   Hunger Vital Sign    Worried About Running Out of Food in the Last Year: Never true    [place] in the Last Year: Never true  Transportation Needs: No Transportation Needs (05/06/2020)   PRAPARE - Hydrologist (Medical): No    Lack of Transportation (Non-Medical): No  Physical Activity: Not on file  Stress: Not on file  Social Connections: Not on file  Intimate Partner Violence: Not on file    Family History:    Family History  Problem Relation Age of Onset   Anesthesia problems Neg Hx    Hypotension Neg Hx    Malignant hyperthermia Neg Hx    Pseudochol deficiency Neg Hx      ROS:  Please see the history of present illness.   All other ROS reviewed and negative.     Physical Exam/Data:   Vitals:   01/31/22 0430 01/31/22 0530 01/31/22 0714 01/31/22 0716  BP: (!) 119/95 (!) 91/56 (!) 148/81   Pulse: (!) 57 (!) 52 (!) 59   Resp: (!) '23 12 16   '$ Temp:    97.6 F (36.4 C)  TempSrc:    Oral  SpO2: 100% 98% 96%   Weight:      Height:        Intake/Output Summary (Last 24 hours) at 01/31/2022 0827 Last data filed at 01/30/2022 2332 Gross per 24 hour  Intake 500 ml  Output --  Net 500 ml      01/31/2022    1:00 AM 01/30/2022    3:30 PM 08/09/2021   10:35 AM  Last 3 Weights  Weight (lbs) 262 lb 9.1 oz 262 lb 9.6 oz 259 lb 12.8 oz  Weight (kg) 119.1 kg 119.115 kg 117.845 kg     Body mass index is 35.61  kg/m.  General:  Well nourished, well developed, in no acute distress HEENT: normal Neck: no JVD Vascular: No carotid bruits; Distal pulses 2+ bilaterally Cardiac:  normal  S1, S2; RRR; no murmur  Lungs:  clear to auscultation bilaterally, no wheezing, rhonchi or rales  Abd: soft, nontender, no hepatomegaly  Ext: no edema Musculoskeletal:  No deformities, BUE and BLE strength normal and equal Skin: warm and dry  Neuro:  CNs 2-12 intact, no focal abnormalities noted Psych:  Normal affect   EKG:  The EKG was personally reviewed and demonstrates:  SVT 140bpm Telemetry:  Telemetry was personally reviewed and demonstrates:  unable to review at this time  Relevant CV Studies:  Cardiac monitor 06/20/2020 Study Highlights     Patch Wear Time:  4 days and 16 hours (2022-04-13T10:53:27-0400 to 2022-04-18T03:36:31-0400)   1. Sinus rhythm - avg HR of 69 bpm.  2. Rare PACs and PVCs.   Normal monitor.    Glori Bickers, MD  11:21 PM       Echocardiogram 08/25/2020  1. Left ventricular ejection fraction, by estimation, is 55 to 60%. The left ventricle has normal function. Left ventricular endocardial border not optimally defined to evaluate regional wall motion. There is mild left ventricular hypertrophy. Left ventricular diastolic parameters are consistent with Grade I diastolic dysfunction (impaired relaxation). 2. Right ventricular systolic function is normal. The right ventricular size is normal. Tricuspid regurgitation signal is inadequate for assessing PA pressure. 3. The mitral valve is abnormal. Mild mitral valve regurgitation. No evidence of mitral stenosis. 4. The aortic valve has an indeterminant number of cusps. Aortic valve regurgitation is not visualized. No aortic stenosis is present. 5. Visualized portion of the abdominal aorta appears dilated at 3.5 cm. 6. The inferior vena cava is normal in size with greater than 50% respiratory variability, suggesting right atrial pressure of 3 mmHg. Comparison(s): ECHO 05/2020 with EF 45%, G1DD, no valvular disease, normal RV.           Cardiac catheterization / PCI 05/20/2020 Conclusion     Mid  RCA lesion is 40% stenosed. Dist RCA lesion is 75% stenosed with 75% stenosed side branch in RPDA. 2nd Mrg lesion is 100% stenosed. Ost Cx to Prox Cx lesion is 30% stenosed. Dist LAD lesion is 95% stenosed. Ost LAD to Prox LAD lesion is 50% stenosed. Mid LAD lesion is 50% stenosed. 1st Diag lesion is 95% stenosed.   Findings:   Ao = 149/78 (104) LV =  139/10 RA =  3 RV = 19/3 PA = 21/9 (14) PCW = 4 Fick cardiac output/index = 6.2/2.5 PVR = 1.5 WU Ao sat = 99% PA sat = 72%, 76%   Assessment: 1. 3v CAD 2. EF 40-45% 3. Well compensated hemodynamics   Plan/Discussion:   Plan PCI of LAD. Distal RCA borderline significant but does not feed much myocardium. Will treat that lesion medically for now unless he develops angina.    Diagnostic Dominance: Right      Intervention       Conclusion     Prox LAD lesion is 50% stenosed. Mid LAD lesion is 50% stenosed. Dist LAD lesion is 95% stenosed. A drug-eluting stent was successfully placed using a SYNERGY XD 2.75X28. Post intervention, there is a 0% residual stenosis.   1. Severe stenosis mid LAD 2. Successful PTCA/DES x 1 mid LAD 3. See Dr. Clayborne Dana report from the diagnostic cath for further details of the other vessels   Recommendations: DAPT with ASA and Plavix for  at least six months. Same day post PCI discharge   Diagnostic Dominance: Right      Intervention            Echocardiogram 05/05/2020 1.Apical images concerning for LV thrombus. GIven drop in EF and recent CVA, echo contrast performed. On echo images, there is no definite thrombus. There appears to be trabeculation/musculature at the apex, but there is also slow moving contrast at the true apex. Given drop in EF and abnormal LV features, would consider cardiac MRI.Marland Kitchen Left ventricular ejection fraction, by estimation, is 40 to 45%. The left ventricle has mildly decreased function. The left ventricle demonstrates global hypokinesis. The left  ventricular internal cavity size was mildly dilated. There is moderate concentric left ventricular hypertrophy. Left ventricular diastolic parameters are indeterminate. Elevated left ventricular end-diastolic pressure. 2. Right ventricular systolic function is normal. The right ventricular size is normal. 3. A small pericardial effusion is present. 4. The mitral valve is grossly normal. Mild mitral valve regurgitation. 5. The aortic valve is grossly normal. Aortic valve regurgitation is not visualized. 6. The inferior vena cava is normal in size with greater than 50% respiratory variability, suggesting right atrial pressure of 3 mmHg. Comparison(s): Prior images unable to be directly viewed, comparison made by report only.  Laboratory Data:  High Sensitivity Troponin:   Recent Labs  Lab 01/30/22 1619 01/30/22 1838 01/30/22 2051 01/31/22 0118  TROPONINIHS 27* 634* 2,141* 2,405*     Chemistry Recent Labs  Lab 01/30/22 1619 01/30/22 2051 01/31/22 0118  NA 139  --  140  K 4.1  --  3.5  CL 104  --  110  CO2 23  --  23  GLUCOSE 108*  --  122*  BUN 30*  --  29*  CREATININE 1.59*  --  1.31*  CALCIUM 8.8*  --  8.6*  MG  --  2.3  --   GFRNONAA 49*  --  >60  ANIONGAP 12  --  7    Recent Labs  Lab 01/31/22 0118  PROT 6.2*  ALBUMIN 3.4*  AST 21  ALT 15  ALKPHOS 45  BILITOT 0.6   Lipids  Recent Labs  Lab 01/31/22 0118  CHOL 110  TRIG 150*  HDL 61  LDLCALC 19  CHOLHDL 1.8    Hematology Recent Labs  Lab 01/30/22 1619 01/30/22 2051 01/31/22 0118  WBC 10.7* 12.9* 10.2  RBC 5.74 5.52 5.58  HGB 16.0 15.4 15.6  HCT 50.2 47.4 48.3  MCV 87.5 85.9 86.6  MCH 27.9 27.9 28.0  MCHC 31.9 32.5 32.3  RDW 15.0 14.7 14.8  PLT 231 207 201   Thyroid  Recent Labs  Lab 01/30/22 1619  TSH 1.055    BNPNo results for input(s): "BNP", "PROBNP" in the last 168 hours.  DDimer  Recent Labs  Lab 01/30/22 1619  DDIMER <0.27     Radiology/Studies:  [facility] Chest Port 1  View  Result Date: 01/30/2022 CLINICAL DATA:  Chest pain. EXAM: PORTABLE CHEST 1 VIEW COMPARISON:  May 04, 2020. FINDINGS: The heart size and mediastinal contours are within normal limits. Both lungs are clear. The visualized skeletal structures are unremarkable. IMPRESSION: No active disease. Electronically Signed   By: Marijo Conception M.D.   On: 01/30/2022 16:43     Assessment and Plan:   Palpitations/chest discomfort pSVT -Patient presents with palpitations/elevated heart rate to the 140s with associated chest discomfort for 40 minutes.  He reports he had a caffeinated drink, which was abnormal for  him.  He was also on prednisone and an antibiotic for sinus infection -Labs showed mild dehydration> he was given IV fluids -D-dimer normal -TSH normal -Keep potassium greater than 4 and mag greater than 2 -Patient reports history of SVT, which is likely the culprit -Troponin elevated to the 2405, plan for echo.  He is on IV heparin -Unable to review telemetry at this time.  Continue telemetry -Patient will need heart monitor at discharge  Elevated troponin CAD s/p DES LAD 05/2020 -High-sensitivity troponin elevated to 2405 he was started on IV heparin -He reports chest discomfort in the setting of palpitations/elevated heart rate -No active chest pain -Continue IV heparin x 48 hours -Continue to trend troponin -Recheck an echo -Given history of CAD may require further ischemic workup, will discuss with MD.  Patient is not wanting to travel to [facility]. -Continue aspirin 81 mg daily and Plavix 75 mg daily.  Continue Repatha and Crestor.  HFimpEF ICM -EF as low as 45% in the past by echo 04/2020 -EF improved by echo July showing LVEF 55 to 19%, grade 1 diastolic dysfunction -Post recent echo in July 2023 showed EF 60 to 65% - Continue Coreg 25 mg twice daily -Continue Jardiance 10 mg daily -Continue spironolactone 25 mg daily -Continue Entresto 97-23 mg twice daily -Patient takes  torsemide 20 mg as needed for swelling  Hyperlipidemia -Lipid panel this admission showed triglycerides 150, HDL 61, LDL 19 -Continue Repatha and Crestor  OSA -He reports compliance with CPAP  History of stroke in March 2022 -Patient is on dual antiplatelet therapy and cholesterol medication  For questions or updates, please contact [place] Please consult [email or web address] for contact info under    Signed, Cadence Arlyss Repress  01/31/2022 8:27 AM   Patient examined chart reviewed Discussed care with patient and PA Exam with overweight white male no distress. Lungs clear no murmur abdomen benign good pulses and trace edema. ECG non acute Troponin elevated > 2000. Known CAD with stent to distal LAD and residual distal RCA/D1 dx. He refused to be transferred to [facility] for cath. "My insurance will not cover that"  Admit to [facility].  Check echo as prior EF was normal. Heparin Telemetry to monitor for SVT/PAF. Will need ouptatient 30 day monitor If rhythm stable, EF preserved and no further arrhythmia or chest pain possible d/c in am and can consider arranging outpatient cath Outpatient f/u will be with Dr Domenic Polite  If EF down or mor angina with arrhythmia will try to convince him to go to [facility] for further care Likely demand ischemia in setting of tachycardia and known CAD  Jenkins Rouge MD [facility]

## 2022-01-31 NOTE — Interval H&P Note (Signed)
History and Physical Interval Note:  01/31/2022 4:18 PM  Dean Wilson  has presented today for surgery, with the diagnosis of nstemi.  The various methods of treatment have been discussed with the patient and family. After consideration of risks, benefits and other options for treatment, the patient has consented to  Procedure(s): LEFT HEART CATH AND CORONARY ANGIOGRAPHY (N/A) as a surgical intervention.  The patient's history has been reviewed, patient examined, no change in status, stable for surgery.  I have reviewed the patient's chart and labs.  Questions were answered to the patient's satisfaction.     [name]

## 2022-01-31 NOTE — Assessment & Plan Note (Signed)
-   Troponin significantly elevated from 27>> 2400 - Heparin started - Consult cardiology - Monitor on telemetry - EKG does not show acute ischemic changes

## 2022-01-31 NOTE — Progress Notes (Signed)
Key: [id number] Repatha SureClick '140MG'$ /ML auto-injectors Form [employer] Electronic Prior Authorization Form [id number] NCPDP)  Request Reference Number: [id number]. REPATHA SURE INJ '140MG'$ /ML is approved through 02/01/2023. Your patient may now fill this prescription and it will be covered.

## 2022-01-31 NOTE — Assessment & Plan Note (Signed)
-   Continue Coreg, hydralazine, Aldactone

## 2022-01-31 NOTE — Progress Notes (Addendum)
Call from hospitalist indicating patient changed his mind about transfer TTE with EF 50-55% troponin 2405 no acute ECG changes likey demand Ischemia in setting of atrial arrhythmia Prior stent to distal LAD and residual dx in Diagonal and distal RCA Orders written for cath and our service notified of transfer Will keep NPO but not sure cath can be done today Continue heparin Spoke with cath lab and they indicated they may be able to speak with [facility] with transfer directly to cath lab for cath today and worry about bed latter   Jenkins Rouge MD [facility]

## 2022-01-31 NOTE — Assessment & Plan Note (Addendum)
-   Last echo showed grade 1 diastolic heart failure with an ejection fraction of 60 to 65% - Update echo - Continue Coreg, Crestor, Entresto, Aldactone, Jardiance, Repatha - Cardiology consulted - Continue to monitor

## 2022-01-31 NOTE — ED Notes (Signed)
Date and time results received: 01/31/22 0100 (use smartphrase ".now" to insert current time)  Test: troponin Critical Value: 2141  Name of Provider Notified: A. Zierle-Ghosh, DO

## 2022-01-31 NOTE — Hospital Course (Signed)
Dean Wilson is a 61 y.o. M with CAD s/p PCI to LAD 2022, sCHF EF now recovered, HTN, CKD IIIa baseline 1.3-1.5, obesity, OSA and stroke who presented with chest tightness and palpitations while driving from [place].     12/27: Hstrop 27> 634> 2141> 2405, admitted and started on heparin gtt, Cards consulted --> went to Cath, multivessel disease, recommended medical therapy

## 2022-01-31 NOTE — Progress Notes (Signed)
ANTICOAGULATION CONSULT NOTE - Pharmacy Consult for heparin Indication: chest pain/ACS  Allergies  Allergen Reactions   Banana Itching   Sunflower Oil Itching    Seeds not oil   Tomato Itching    Patient Measurements: Height: 6' (182.9 cm) Weight: 119.2 kg (262 lb 12.6 oz) IBW/kg (Calculated) : 77.6 Heparin Dosing Weight: 105kg  Vital Signs: Temp: 97.8 F (36.6 C) (12/27 0854) Temp Source: Oral (12/27 0854) BP: 137/89 (12/27 0854) Pulse Rate: 60 (12/27 0854)  Labs: Recent Labs    01/30/22 1619 01/30/22 1838 01/30/22 2051 01/31/22 0118 01/31/22 1135  HGB 16.0  --  15.4 15.6  --   HCT 50.2  --  47.4 48.3  --   PLT 231  --  207 201  --   HEPARINUNFRC  --   --   --   --  0.92*  CREATININE 1.59*  --   --  1.31*  --   TROPONINIHS 27* 634* 2,141* 2,405*  --      Estimated Creatinine Clearance: 77.9 mL/min (A) (by C-G formula based on SCr of 1.31 mg/dL (H)).   Medical History: Past Medical History:  Diagnosis Date   CAD (coronary artery disease)    DES to mid LAD April 2022   Essential hypertension    GERD (gastroesophageal reflux disease)    History of cardiomyopathy    OSA on CPAP    PSVT (paroxysmal supraventricular tachycardia)    Stroke [facility])     Assessment: 61yo male c/o SOB, cold sweats, and palpitations, found to be in ST/SVT which converted to NSR without intervention, sx improved but now troponin elevated and rising sharply (29>798>9211) >> to begin heparin.  HL 0.92- supratherapeutic CBC WNL Trop 2405  Goal of Therapy:  Heparin level 0.3-0.7 units/ml Monitor platelets by anticoagulation protocol: Yes   Plan:  Decrease heparin infusion to 1300 units/hr. Monitor heparin levels an CBC.  Margot Ables, PharmD Clinical Pharmacist 01/31/2022 12:35 PM

## 2022-02-01 ENCOUNTER — Encounter (HOSPITAL_COMMUNITY): Payer: Self-pay | Admitting: Cardiovascular Disease

## 2022-02-01 DIAGNOSIS — E669 Obesity, unspecified: Secondary | ICD-10-CM | POA: Insufficient documentation

## 2022-02-01 DIAGNOSIS — R079 Chest pain, unspecified: Secondary | ICD-10-CM | POA: Diagnosis not present

## 2022-02-01 DIAGNOSIS — I259 Chronic ischemic heart disease, unspecified: Secondary | ICD-10-CM | POA: Diagnosis not present

## 2022-02-01 DIAGNOSIS — N1831 Chronic kidney disease, stage 3a: Secondary | ICD-10-CM | POA: Insufficient documentation

## 2022-02-01 DIAGNOSIS — G4733 Obstructive sleep apnea (adult) (pediatric): Secondary | ICD-10-CM | POA: Insufficient documentation

## 2022-02-01 LAB — BASIC METABOLIC PANEL
Anion gap: 8 (ref 5–15)
BUN: 22 mg/dL (ref 8–23)
CO2: 25 mmol/L (ref 22–32)
Calcium: 8.7 mg/dL — ABNORMAL LOW (ref 8.9–10.3)
Chloride: 108 mmol/L (ref 98–111)
Creatinine, Ser: 1.27 mg/dL — ABNORMAL HIGH (ref 0.61–1.24)
GFR, Estimated: >60 mL/min (ref >60)
Glucose, Bld: 106 mg/dL — ABNORMAL HIGH (ref 70–99)
Potassium: 3.8 mmol/L (ref 3.5–5.1)
Sodium: 141 mmol/L (ref 135–145)

## 2022-02-01 NOTE — Assessment & Plan Note (Signed)
Baseline Cr 1.3-1.5, stable here

## 2022-02-01 NOTE — TOC Transition Note (Signed)
Transition of Care [facility]) - CM/SW Discharge Note   Patient Details  Name: Dean Wilson MRN: [id number] Date of Birth: 05-Mar-1960  Transition of Care  [facility]) CM/SW Contact:  Pollie Friar, RN Phone Number: 02/01/2022, 9:49 AM   Clinical Narrative:    Pt is discharging home with self care. No needs per TOC.   Final next level of care: Home/Self Care Barriers to Discharge: No Barriers Identified   Patient Goals and CMS Choice      Discharge Placement                         Discharge Plan and Services Additional resources added to the After Visit Summary for                                       Social Determinants of Health (SDOH) Interventions SDOH Screenings   Food Insecurity: No Food Insecurity (01/31/2022)  Housing: Low Risk  (01/31/2022)  Transportation Needs: No Transportation Needs (01/31/2022)  Utilities: Not At Risk (01/31/2022)  Alcohol Screen: Low Risk  (05/06/2020)  Financial Resource Strain: Low Risk  (05/06/2020)  Tobacco Use: Medium Risk (02/01/2022)     Readmission Risk Interventions     No data to display

## 2022-02-01 NOTE — Discharge Summary (Signed)
Physician Discharge Summary   Patient: Dean Wilson MRN: [id number] DOB: 1960/06/19  Admit date:     01/30/2022  Discharge date: 02/01/22  Discharge Physician: Edwin Dada   PCP: Glenda Chroman, MD     Recommendations at discharge:  Follow up with PCP Dr. Woody Seller in 1 week Dr. Woody Seller: Please obtain BMP in 1 week  Follow up with Dr. Domenic Polite Cardiology for coronary disease and PSVT in 2 weeks     Discharge Diagnoses: Principal Problem:   Demand ischemia Active Problems:   History of CVA (cerebrovascular accident)   Chronic systolic congestive heart failure (HCC)   Paroxysmal supraventricular tachycardia   Essential hypertension   Mixed hyperlipidemia   Obesity (BMI 30-39.9)   OSA (obstructive sleep apnea)   Stage 3a chronic kidney disease (CKD) [facility])     Hospital Course: Dean Wilson is a 61 y.o. M with CAD s/p PCI to LAD 2022, sCHF EF now recovered, HTN, CKD IIIa baseline 1.3-1.5, obesity, OSA and stroke who presented with chest tightness and palpitations while driving from [place].         * Demand ischemia Myocardial infarction ruled out.  Patient admitted on heparin.  Cardiology consutled and took for [facility] that showed diffuse moderate disease, no culprit lesions.  Patient disclosed that he had taken a caffeine drink while driving >17 hours and taking prednisone and so it was suspected that he had a recurrence of his old paroxysmal SVT and this provoked some type 2 ischemia in the setting of his multivessel coronary disease  Recommend close follow up with Dr. Domenic Polite      Stage 3a chronic kidney disease (CKD) ([place]) Baseline Cr 1.3-1.5, stable here  Mixed hyperlipidemia  Essential hypertension  Paroxysmal supraventricular tachycardia Atrial fibrillation ruled out.  Patient reported to admitting MD he had a "history of atrial fibrillation" and was "taken off of medication for atrial fibrillation years ago", but Cardiology notes indicate he had a  remote PSVT, after his embolic stroke in 5102, had a Zio patch with no atrial arrhythmias.   Chronic systolic congestive heart failure ([place])              The [facility] Controlled Substances Registry was reviewed for this patient prior to discharge.   Consultants: Cardiology  Procedures performed:  Echocardiogram Left heart cath   Disposition: Home Diet recommendation:  Discharge Diet Orders (From admission, onward)     Start     Ordered   02/01/22 0000  Diet - low sodium heart healthy        02/01/22 1023             DISCHARGE MEDICATION: Allergies as of 02/01/2022       Reactions   Banana Itching   Sunflower Oil Itching   Seeds not oil   Tomato Itching        Medication List     STOP taking these medications    amoxicillin-clavulanate 500-125 MG tablet Commonly known as: AUGMENTIN   predniSONE 10 MG tablet Commonly known as: DELTASONE       TAKE these medications    amLODipine 5 MG tablet Commonly known as: NORVASC Take 1 tablet by mouth daily.   Aspirin Low Dose 81 MG tablet Generic drug: aspirin EC Take 81 mg by mouth daily.   carvedilol 25 MG tablet Commonly known as: COREG Take 25 mg by mouth 2 (two) times daily.   clopidogrel 75 MG tablet Commonly known as: PLAVIX Take 75 mg  by mouth daily.   Denta 5000 Plus 1.1 % Crea dental cream Generic drug: sodium fluoride Take 1 application by mouth at bedtime.   empagliflozin 10 MG Tabs tablet Commonly known as: Jardiance Take 1 tablet (10 mg total) by mouth daily before breakfast.   Entresto 97-103 MG Generic drug: sacubitril-valsartan Take 1 tablet by mouth 2 (two) times daily.   hydrALAZINE 25 MG tablet Commonly known as: APRESOLINE Take 25 mg by mouth 3 (three) times daily.   meclizine 25 MG tablet Commonly known as: ANTIVERT Take 1 tablet (25 mg total) by mouth 3 (three) times daily as needed for up to 15 doses for dizziness.   multivitamin tablet Take 1  tablet by mouth daily.   pantoprazole 40 MG tablet Commonly known as: Protonix Take 1 tablet (40 mg total) by mouth daily.   Repatha 140 MG/ML Sosy Generic drug: Evolocumab Inject 140 mg into the skin every 14 (fourteen) days.   rosuvastatin 40 MG tablet Commonly known as: CRESTOR Take 1 tablet (40 mg total) by mouth daily.   spironolactone 25 MG tablet Commonly known as: ALDACTONE Take 1 tablet (25 mg total) by mouth daily.   torsemide 20 MG tablet Commonly known as: DEMADEX Take 1 tablet (20 mg total) by mouth daily as needed.        Follow-up Information     Glenda Chroman, MD Follow up on 02/12/2022.   Specialty: Internal Medicine Why: '@1'$ :30pm Contact information: [address] [place] [phone]                 Discharge Instructions     Diet - low sodium heart healthy   Complete by: As directed    Discharge instructions   Complete by: As directed    **IMPORTANT DISCHARGE INSTRUCTIONS**   From Dr. Loleta Books: You were admitted for chest pain Here, we found that your "troponin" as elevated, which is an enzyme only found in heart muscle, and which indicated some heart injury We did a catheterization that showed no new blockages, just a lot of old ones, and we suspect that you had an episode of fast heart rate from the caffeine, prednisone, etc. That caused stress on your heart and some brief heart injury  You should be on the following heart medicines: Amlodipine '5mg'$  Coreg '25mg'$  twice daily Hydralazine '25mg'$  three times daily Entresto 97-103 mg twice daily Spironolactone '25mg'$   Torsemide 20 mg as needed  Go see Dr. Domenic Polite in 2 weeks as scheduled   Increase activity slowly   Complete by: As directed        Discharge Exam: Filed Weights   01/31/22 0100 01/31/22 0854 02/01/22 0333  Weight: 119.1 kg 119.2 kg 117.6 kg    General: Pt is alert, awake, not in acute distress Cardiovascular: RRR, nl S1-S2, no murmurs appreciated.   No LE  edema.   Respiratory: Normal respiratory rate and rhythm.  CTAB without rales or wheezes. Abdominal: Abdomen soft and non-tender.  No distension or HSM.   Neuro/Psych: Strength symmetric in upper and lower extremities.  Judgment and insight appear normal.   Condition at discharge: good  The results of significant diagnostics from this hospitalization (including imaging, microbiology, ancillary and laboratory) are listed below for reference.   Imaging Studies: CARDIAC CATHETERIZATION  Result Date: 01/31/2022   Ost LAD to Prox LAD lesion is 50% stenosed.   Ost Cx to Prox Cx lesion is 30% stenosed.   Mid RCA to Dist RCA lesion is  40% stenosed.   1st Diag lesion is 95% stenosed.   2nd Mrg lesion is 100% stenosed.   Dist RCA lesion is 60% stenosed.   Mid LAD lesion is 50% stenosed.   Previously placed Mid LAD to Dist LAD stent of unknown type is  widely patent. 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 Continue DAPT with ASA and plavix x 12 months. Reported SVT on presentation possible Type II MI from demand ischemia.   ECHOCARDIOGRAM COMPLETE  Result Date: 01/31/2022    ECHOCARDIOGRAM REPORT   Patient Name:   Dean Wilson Date of Exam: 01/31/2022 Medical Rec #:  [id number]           Height:       72.0 in Accession #:    [id number]          Weight:       262.8 lb Date of Birth:  10/10/1960           BSA:          2.393 m Patient Age:    80 years            BP:           137/89 mmHg Patient Gender: M                   HR:           57 bpm. Exam Location:  [facility] Procedure: 2D Echo, Cardiac Doppler and Color  Doppler Indications:    Elevated Troponin  History:        Patient has prior history of Echocardiogram examinations, most                 recent 08/14/2021. CHF, CAD, Abnormal ECG, Stroke,                 Signs/Symptoms:Chest Pain; Risk Factors:Hypertension,                 Dyslipidemia and Former Smoker.  Sonographer:    Wenda Low Referring Phys: [id number] CADENCE H FURTH  Sonographer Comments: Technically challenging study due to limited acoustic windows and patient is obese. IMPRESSIONS  1. Abnormal septal motion inferior basal hypokinesis . Left ventricular ejection fraction, by estimation, is 50 to 55%. The left ventricle has low normal function. The left ventricle has no regional wall motion abnormalities. There is moderate left ventricular hypertrophy. Left ventricular diastolic parameters are consistent with Grade I diastolic dysfunction (impaired relaxation).  2. Right ventricular systolic function is normal. The right ventricular size is normal.  3. Left atrial size was mildly dilated.  4. The mitral valve is abnormal. No evidence of mitral valve regurgitation. No evidence of mitral stenosis. Moderate mitral annular calcification.  5. The aortic valve is tricuspid. There is mild calcification of the aortic valve. Aortic valve regurgitation is not visualized. No aortic stenosis is present.  6. Aortic dilatation noted. There is mild dilatation of the aortic root, measuring 39 mm.  7. The inferior vena cava is normal in size with greater than 50% respiratory variability, suggesting right atrial pressure of 3 mmHg. FINDINGS  Left Ventricle: Abnormal septal motion inferior basal  hypokinesis. Left ventricular ejection fraction, by estimation, is 50 to 55%. The left ventricle has low normal function. The left ventricle has no regional wall motion abnormalities. The left ventricular internal cavity size was normal in size. There is moderate left ventricular hypertrophy. Left ventricular diastolic parameters  are consistent with Grade I diastolic dysfunction (impaired relaxation). Right Ventricle: The right ventricular size is normal. No increase in right ventricular wall thickness. Right ventricular systolic function is normal. Left Atrium: Left atrial size was mildly dilated. Right Atrium: Right atrial size was normal in size. Pericardium: There is no evidence of pericardial effusion. Mitral Valve: The mitral valve is abnormal. There is mild thickening of the mitral valve leaflet(s). There is mild calcification of the mitral valve leaflet(s). Moderate mitral annular calcification. No evidence of mitral valve regurgitation. No evidence  of mitral valve stenosis. MV peak gradient, 6.4 mmHg. The mean mitral valve gradient is 2.0 mmHg. Tricuspid Valve: The tricuspid valve is normal in structure. Tricuspid valve regurgitation is not demonstrated. No evidence of tricuspid stenosis. Aortic Valve: The aortic valve is tricuspid. There is mild calcification of the aortic valve. Aortic valve regurgitation is not visualized. No aortic stenosis is present. Aortic valve mean gradient measures 4.0 mmHg. Aortic valve peak gradient measures 7.4 mmHg. Aortic valve area, by VTI measures 2.90 cm. Pulmonic Valve: The pulmonic valve was normal in structure. Pulmonic valve regurgitation is not visualized. No evidence of pulmonic stenosis. Aorta: Aortic dilatation noted. There is mild dilatation of the aortic root, measuring 39 mm. Venous: The inferior vena cava is normal in size with greater than 50% respiratory variability, suggesting right atrial pressure of 3 mmHg. IAS/Shunts: The interatrial septum appears to be lipomatous. No atrial level shunt detected by color flow Doppler.  LEFT VENTRICLE PLAX 2D LVIDd:         4.90 cm   Diastology LVIDs:         3.50 cm   LV e' medial:    5.55 cm/s LV PW:         1.50 cm   LV E/e' medial:  15.1 LV IVS:        1.40 cm   LV e' lateral:   7.18 cm/s LVOT diam:     2.00 cm   LV E/e' lateral: 11.7 LV SV:          96 LV SV Index:   40 LVOT Area:     3.14 cm  RIGHT VENTRICLE RV Basal diam:  3.60 cm RV Mid diam:    2.90 cm RV S prime:     14.00 cm/s TAPSE (M-mode): 3.6 cm LEFT ATRIUM             Index        RIGHT ATRIUM           Index LA diam:        3.90 cm 1.63 cm/m   RA Area:     21.50 cm LA Vol (A2C):   88.7 ml 37.07 ml/m  RA Volume:   62.10 ml  25.95 ml/m LA Vol (A4C):   62.8 ml 26.25 ml/m LA Biplane Vol: 77.1 ml 32.22 ml/m  AORTIC VALVE                    PULMONIC VALVE AV Area (Vmax):    2.98 cm     PV Vmax:       0.93 m/s AV Area (Vmean):   3.16 cm     PV  Peak grad:  3.5 mmHg AV Area (VTI):     2.90 cm AV Vmax:           136.00 cm/s AV Vmean:          89.700 cm/s AV VTI:            0.331 m AV Peak Grad:      7.4 mmHg AV Mean Grad:      4.0 mmHg LVOT Vmax:         129.00 cm/s LVOT Vmean:        90.100 cm/s LVOT VTI:          0.306 m LVOT/AV VTI ratio: 0.92  AORTA Ao Root diam: 3.90 cm MITRAL VALVE MV Area (PHT): 2.74 cm     SHUNTS MV Area VTI:   2.34 cm     Systemic VTI:  0.31 m MV Peak grad:  6.4 mmHg     Systemic Diam: 2.00 cm MV Mean grad:  2.0 mmHg MV Vmax:       1.26 m/s MV Vmean:      71.6 cm/s MV Decel Time: 277 msec MV E velocity: 84.00 cm/s MV A velocity: 102.00 cm/s MV E/A ratio:  0.82 Jenkins Rouge MD Electronically signed by Jenkins Rouge MD Signature Date/Time: 01/31/2022/11:45:21 AM    Final    DG Chest Port 1 View  Result Date: 01/30/2022 CLINICAL DATA:  Chest pain. EXAM: PORTABLE CHEST 1 VIEW COMPARISON:  May 04, 2020. FINDINGS: The heart size and mediastinal contours are within normal limits. Both lungs are clear. The visualized skeletal structures are unremarkable. IMPRESSION: No active disease. Electronically Signed   By: Marijo Conception M.D.   On: 01/30/2022 16:43    Microbiology: Results for orders placed or performed during the hospital encounter of 01/30/22  Resp panel by RT-PCR (RSV, Flu A&B, Covid) Anterior Nasal Swab     Status: None   Collection Time: 01/30/22   3:30 PM   Specimen: Anterior Nasal Swab  Result Value Ref Range Status   SARS Coronavirus 2 by RT PCR NEGATIVE NEGATIVE Final    Comment: (NOTE) SARS-CoV-2 target nucleic acids are NOT DETECTED.  The SARS-CoV-2 RNA is generally detectable in upper respiratory specimens during the acute phase of infection. The lowest concentration of SARS-CoV-2 viral copies this assay can detect is 138 copies/mL. A negative result does not preclude SARS-Cov-2 infection and should not be used as the sole basis for treatment or other patient management decisions. A negative result may occur with  improper specimen collection/handling, submission of specimen other than nasopharyngeal swab, presence of viral mutation(s) within the areas targeted by this assay, and inadequate number of viral copies(<138 copies/mL). A negative result must be combined with clinical observations, patient history, and epidemiological information. The expected result is Negative.  Fact Sheet for Patients:  EntrepreneurPulse.com.au  Fact Sheet for Healthcare Providers:  IncredibleEmployment.be  This test is no t yet approved or cleared by the [place] FDA and  has been authorized for detection and/or diagnosis of SARS-CoV-2 by FDA under an Emergency Use Authorization (EUA). This EUA will remain  in effect (meaning this test can be used) for the duration of the COVID-19 declaration under Section 564(b)(1) of the Act, 21 U.S.C.section 360bbb-3(b)(1), unless the authorization is terminated  or revoked sooner.       Influenza A by PCR NEGATIVE NEGATIVE Final   Influenza B by PCR NEGATIVE NEGATIVE Final    Comment: (NOTE) The Xpert Xpress SARS-CoV-2/FLU/RSV plus  assay is intended as an aid in the diagnosis of influenza from Nasopharyngeal swab specimens and should not be used as a sole basis for treatment. Nasal washings and aspirates are unacceptable for Xpert Xpress  SARS-CoV-2/FLU/RSV testing.  Fact Sheet for Patients: EntrepreneurPulse.com.au  Fact Sheet for Healthcare Providers: IncredibleEmployment.be  This test is not yet approved or cleared by the [place] FDA and has been authorized for detection and/or diagnosis of SARS-CoV-2 by FDA under an Emergency Use Authorization (EUA). This EUA will remain in effect (meaning this test can be used) for the duration of the COVID-19 declaration under Section 564(b)(1) of the Act, 21 U.S.C. section 360bbb-3(b)(1), unless the authorization is terminated or revoked.     Resp Syncytial Virus by PCR NEGATIVE NEGATIVE Final    Comment: (NOTE) Fact Sheet for Patients: EntrepreneurPulse.com.au  Fact Sheet for Healthcare Providers: IncredibleEmployment.be  This test is not yet approved or cleared by the [place] FDA and has been authorized for detection and/or diagnosis of SARS-CoV-2 by FDA under an Emergency Use Authorization (EUA). This EUA will remain in effect (meaning this test can be used) for the duration of the COVID-19 declaration under Section 564(b)(1) of the Act, 21 U.S.C. section 360bbb-3(b)(1), unless the authorization is terminated or revoked.  Performed at [facility], [address], [place], [place] [address]     Labs: CBC: Recent Labs  Lab 01/30/22 1619 01/30/22 2051 01/31/22 0118  WBC 10.7* 12.9* 10.2  NEUTROABS  --  8.6*  --   HGB 16.0 15.4 15.6  HCT 50.2 47.4 48.3  MCV 87.5 85.9 86.6  PLT 231 207 237   Basic Metabolic Panel: Recent Labs  Lab 01/30/22 1619 01/30/22 2051 01/31/22 0118 02/01/22 0705  NA 139  --  140 141  K 4.1  --  3.5 3.8  CL 104  --  110 108  CO2 23  --  23 25  GLUCOSE 108*  --  122* 106*  BUN 30*  --  29* 22  CREATININE 1.59*  --  1.31* 1.27*  CALCIUM 8.8*  --  8.6* 8.7*  MG  --  2.3  --   --    Liver Function Tests: Recent Labs  Lab 01/31/22 0118  AST  21  ALT 15  ALKPHOS 45  BILITOT 0.6  PROT 6.2*  ALBUMIN 3.4*   CBG: No results for input(s): "GLUCAP" in the last 168 hours.  Discharge time spent: approximately 35 minutes spent on discharge counseling, evaluation of patient on day of discharge, and coordination of discharge planning with nursing, social work, pharmacy and case management  Signed: Edwin Dada, MD [facility] 02/01/2022

## 2022-02-01 NOTE — Plan of Care (Signed)
  Problem: Education: Goal: Understanding of cardiac disease, CV risk reduction, and recovery process will improve Outcome: Completed/Met Goal: Individualized Educational Video(s) Outcome: Completed/Met   Problem: Activity: Goal: Ability to tolerate increased activity will improve Outcome: Completed/Met   Problem: Cardiac: Goal: Ability to achieve and maintain adequate cardiovascular perfusion will improve Outcome: Completed/Met   Problem: Health Behavior/Discharge Planning: Goal: Ability to safely manage health-related needs after discharge will improve Outcome: Completed/Met   Problem: Education: Goal: Knowledge of General Education information will improve Description: Including pain rating scale, medication(s)/side effects and non-pharmacologic comfort measures Outcome: Completed/Met   Problem: Health Behavior/Discharge Planning: Goal: Ability to manage health-related needs will improve Outcome: Completed/Met   Problem: Clinical Measurements: Goal: Ability to maintain clinical measurements within normal limits will improve Outcome: Completed/Met Goal: Will remain free from infection Outcome: Completed/Met Goal: Diagnostic test results will improve Outcome: Completed/Met Goal: Respiratory complications will improve Outcome: Completed/Met Goal: Cardiovascular complication will be avoided Outcome: Completed/Met   Problem: Activity: Goal: Risk for activity intolerance will decrease Outcome: Completed/Met   Problem: Nutrition: Goal: Adequate nutrition will be maintained Outcome: Completed/Met   Problem: Coping: Goal: Level of anxiety will decrease Outcome: Completed/Met   Problem: Elimination: Goal: Will not experience complications related to bowel motility Outcome: Completed/Met Goal: Will not experience complications related to urinary retention Outcome: Completed/Met   Problem: Pain Managment: Goal: General experience of comfort will improve Outcome:  Completed/Met   Problem: Safety: Goal: Ability to remain free from injury will improve Outcome: Completed/Met   Problem: Skin Integrity: Goal: Risk for impaired skin integrity will decrease Outcome: Completed/Met   Problem: Education: Goal: Understanding of CV disease, CV risk reduction, and recovery process will improve Outcome: Completed/Met Goal: Individualized Educational Video(s) Outcome: Completed/Met   Problem: Activity: Goal: Ability to return to baseline activity level will improve Outcome: Completed/Met   Problem: Cardiovascular: Goal: Ability to achieve and maintain adequate cardiovascular perfusion will improve Outcome: Completed/Met Goal: Vascular access site(s) Level 0-1 will be maintained Outcome: Completed/Met   Problem: Health Behavior/Discharge Planning: Goal: Ability to safely manage health-related needs after discharge will improve Outcome: Completed/Met

## 2022-02-01 NOTE — Progress Notes (Addendum)
Rounding Note    Patient Name: Dean Wilson Date of Encounter: 02/01/2022  [place] Cardiologist: Rozann Lesches, MD   Subjective   Patient reports feeling very well this morning, denies chest pain, palpitations, shortness of breath. He is excited about going home today.  Inpatient Medications    Scheduled Meds:  amLODipine  5 mg Oral Daily   carvedilol  25 mg Oral BID   clopidogrel  75 mg Oral Daily   empagliflozin  10 mg Oral QAC breakfast   hydrALAZINE  25 mg Oral TID   pantoprazole  40 mg Oral Daily   sacubitril-valsartan  1 tablet Oral BID   sodium chloride flush  3 mL Intravenous Q12H   sodium chloride flush  3 mL Intravenous Q12H   spironolactone  25 mg Oral Daily   Continuous Infusions:  sodium chloride     PRN Meds: sodium chloride, acetaminophen, ondansetron (ZOFRAN) IV, ondansetron (ZOFRAN) IV, sodium chloride flush   Vital Signs    Vitals:   01/31/22 1945 01/31/22 2000 02/01/22 0007 02/01/22 0333  BP: (!) 175/96 (!) 157/86 114/81 (!) 147/88  Pulse: (!) 54 62 65 61  Resp:   18 18  Temp:   98 F (36.7 C) (!) 97.5 F (36.4 C)  TempSrc:   Oral Oral  SpO2: 95% 96% 96% 96%  Weight:    117.6 kg  Height:        Intake/Output Summary (Last 24 hours) at 02/01/2022 0803 Last data filed at 01/31/2022 1848 Gross per 24 hour  Intake 328.33 ml  Output 500 ml  Net -171.67 ml      02/01/2022    3:33 AM 01/31/2022    8:54 AM 01/31/2022    1:00 AM  Last 3 Weights  Weight (lbs) 259 lb 3.2 oz 262 lb 12.6 oz 262 lb 9.1 oz  Weight (kg) 117.572 kg 119.2 kg 119.1 kg      Telemetry    Sinus rhythm with intermittent PVCs - Personally Reviewed  ECG    No new tracing  Physical Exam   GEN: No acute distress.   Neck: No JVD Cardiac: RRR, no murmurs, rubs, or gallops.  Respiratory: Clear to auscultation bilaterally. GI: Soft, nontender, non-distended  MS: No edema; No deformity. Neuro:  Nonfocal  Psych: Normal affect   Labs     High Sensitivity Troponin:   Recent Labs  Lab 01/30/22 1619 01/30/22 1838 01/30/22 2051 01/31/22 0118  TROPONINIHS 27* 634* 2,141* 2,405*     Chemistry Recent Labs  Lab 01/30/22 1619 01/30/22 2051 01/31/22 0118  NA 139  --  140  K 4.1  --  3.5  CL 104  --  110  CO2 23  --  23  GLUCOSE 108*  --  122*  BUN 30*  --  29*  CREATININE 1.59*  --  1.31*  CALCIUM 8.8*  --  8.6*  MG  --  2.3  --   PROT  --   --  6.2*  ALBUMIN  --   --  3.4*  AST  --   --  21  ALT  --   --  15  ALKPHOS  --   --  45  BILITOT  --   --  0.6  GFRNONAA 49*  --  >60  ANIONGAP 12  --  7    Lipids  Recent Labs  Lab 01/31/22 0118  CHOL 110  TRIG 150*  HDL 61  LDLCALC 19  CHOLHDL  1.8    Hematology Recent Labs  Lab 01/30/22 1619 01/30/22 2051 01/31/22 0118  WBC 10.7* 12.9* 10.2  RBC 5.74 5.52 5.58  HGB 16.0 15.4 15.6  HCT 50.2 47.4 48.3  MCV 87.5 85.9 86.6  MCH 27.9 27.9 28.0  MCHC 31.9 32.5 32.3  RDW 15.0 14.7 14.8  PLT 231 207 201   Thyroid  Recent Labs  Lab 01/30/22 1619  TSH 1.055    BNPNo results for input(s): "BNP", "PROBNP" in the last 168 hours.  DDimer  Recent Labs  Lab 01/30/22 1619  DDIMER <0.27     Radiology    CARDIAC CATHETERIZATION  Result Date: 01/31/2022   Ost LAD to Prox LAD lesion is 50% stenosed.   Ost Cx to Prox Cx lesion is 30% stenosed.   Mid RCA to Dist RCA lesion is 40% stenosed.   1st Diag lesion is 95% stenosed.   2nd Mrg lesion is 100% stenosed.   Dist RCA lesion is 60% stenosed.   Mid LAD lesion is 50% stenosed.   Previously placed Mid LAD to Dist LAD stent of unknown type is  widely patent. 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 Continue DAPT with ASA and plavix x 12 months. Reported SVT on presentation possible Type II MI from demand ischemia.   ECHOCARDIOGRAM COMPLETE  Result Date: 01/31/2022    ECHOCARDIOGRAM REPORT   Patient Name:   Dean Wilson Date of Exam: 01/31/2022 Medical Rec #:  [id number]           Height:       72.0 in Accession #:    [id number]          Weight:       262.8 lb Date of Birth:  10/29/1960           BSA:          2.393 m Patient Age:    61 years            BP:           137/89 mmHg Patient Gender: M                   HR:           57 bpm. Exam Location:  [facility] Procedure: 2D Echo, Cardiac Doppler and Color Doppler Indications:    Elevated Troponin  History:        Patient has prior history of Echocardiogram examinations, most                 recent 08/14/2021. CHF, CAD, Abnormal ECG, Stroke,                 Signs/Symptoms:Chest Pain; Risk Factors:Hypertension,                 Dyslipidemia and Former Smoker.  Sonographer:    Wenda Low Referring Phys: [id number] CADENCE H FURTH  Sonographer Comments: Technically challenging study due to limited acoustic windows and patient is obese. IMPRESSIONS  1. Abnormal septal motion inferior basal hypokinesis . Left ventricular ejection fraction, by estimation, is 50 to 55%. The left ventricle has low normal function. The left  ventricle has no regional wall motion abnormalities. There is moderate left ventricular hypertrophy. Left ventricular diastolic parameters are consistent with Grade I diastolic dysfunction (impaired relaxation).  2. Right ventricular systolic function is normal. The right ventricular size is normal.  3. Left atrial size was mildly dilated.  4. The mitral valve is abnormal. No evidence of mitral valve regurgitation. No evidence of mitral stenosis. Moderate mitral annular calcification.  5. The aortic valve is tricuspid. There is mild calcification of the aortic  valve. Aortic valve regurgitation is not visualized. No aortic stenosis is present.  6. Aortic dilatation noted. There is mild dilatation of the aortic root, measuring 39 mm.  7. The inferior vena cava is normal in size with greater than 50% respiratory variability, suggesting right atrial pressure of 3 mmHg. FINDINGS  Left Ventricle: Abnormal septal motion inferior basal hypokinesis. Left ventricular ejection fraction, by estimation, is 50 to 55%. The left ventricle has low normal function. The left ventricle has no regional wall motion abnormalities. The left ventricular internal cavity size was normal in size. There is moderate left ventricular hypertrophy. Left ventricular diastolic parameters are consistent with Grade I diastolic dysfunction (impaired relaxation). Right Ventricle: The right ventricular size is normal. No increase in right ventricular wall thickness. Right ventricular systolic function is normal. Left Atrium: Left atrial size was mildly dilated. Right Atrium: Right atrial size was normal in size. Pericardium: There is no evidence of pericardial effusion. Mitral Valve: The mitral valve is abnormal. There is mild thickening of the mitral valve leaflet(s). There is mild calcification of the mitral valve leaflet(s). Moderate mitral annular calcification. No evidence of mitral valve regurgitation. No evidence  of mitral valve stenosis. MV peak gradient, 6.4 mmHg. The mean mitral valve gradient is 2.0 mmHg. Tricuspid Valve: The tricuspid valve is normal in structure. Tricuspid valve regurgitation is not demonstrated. No evidence of tricuspid stenosis. Aortic Valve: The aortic valve is tricuspid. There is mild calcification of the aortic valve. Aortic valve regurgitation is not visualized. No aortic stenosis is present. Aortic valve mean gradient measures 4.0 mmHg. Aortic valve peak gradient measures 7.4 mmHg. Aortic valve area, by VTI measures 2.90 cm. Pulmonic Valve: The pulmonic valve was normal in  structure. Pulmonic valve regurgitation is not visualized. No evidence of pulmonic stenosis. Aorta: Aortic dilatation noted. There is mild dilatation of the aortic root, measuring 39 mm. Venous: The inferior vena cava is normal in size with greater than 50% respiratory variability, suggesting right atrial pressure of 3 mmHg. IAS/Shunts: The interatrial septum appears to be lipomatous. No atrial level shunt detected by color flow Doppler.  LEFT VENTRICLE PLAX 2D LVIDd:         4.90 cm   Diastology LVIDs:         3.50 cm   LV e' medial:    5.55 cm/s LV PW:         1.50 cm   LV E/e' medial:  15.1 LV IVS:        1.40 cm   LV e' lateral:   7.18 cm/s LVOT diam:     2.00 cm   LV E/e' lateral: 11.7 LV SV:         96 LV SV Index:   40 LVOT Area:     3.14 cm  RIGHT VENTRICLE RV Basal diam:  3.60 cm RV Mid diam:    2.90 cm RV S prime:     14.00 cm/s TAPSE (M-mode): 3.6 cm LEFT ATRIUM  Index        RIGHT ATRIUM           Index LA diam:        3.90 cm 1.63 cm/m   RA Area:     21.50 cm LA Vol (A2C):   88.7 ml 37.07 ml/m  RA Volume:   62.10 ml  25.95 ml/m LA Vol (A4C):   62.8 ml 26.25 ml/m LA Biplane Vol: 77.1 ml 32.22 ml/m  AORTIC VALVE                    PULMONIC VALVE AV Area (Vmax):    2.98 cm     PV Vmax:       0.93 m/s AV Area (Vmean):   3.16 cm     PV Peak grad:  3.5 mmHg AV Area (VTI):     2.90 cm AV Vmax:           136.00 cm/s AV Vmean:          89.700 cm/s AV VTI:            0.331 m AV Peak Grad:      7.4 mmHg AV Mean Grad:      4.0 mmHg LVOT Vmax:         129.00 cm/s LVOT Vmean:        90.100 cm/s LVOT VTI:          0.306 m LVOT/AV VTI ratio: 0.92  AORTA Ao Root diam: 3.90 cm MITRAL VALVE MV Area (PHT): 2.74 cm     SHUNTS MV Area VTI:   2.34 cm     Systemic VTI:  0.31 m MV Peak grad:  6.4 mmHg     Systemic Diam: 2.00 cm MV Mean grad:  2.0 mmHg MV Vmax:       1.26 m/s MV Vmean:      71.6 cm/s MV Decel Time: 277 msec MV E velocity: 84.00 cm/s MV A velocity: 102.00 cm/s MV E/A ratio:  0.82 Jenkins Rouge MD Electronically signed by Jenkins Rouge MD Signature Date/Time: 01/31/2022/11:45:21 AM    Final    DG Chest Port 1 View  Result Date: 01/30/2022 CLINICAL DATA:  Chest pain. EXAM: PORTABLE CHEST 1 VIEW COMPARISON:  May 04, 2020. FINDINGS: The heart size and mediastinal contours are within normal limits. Both lungs are clear. The visualized skeletal structures are unremarkable. IMPRESSION: No active disease. Electronically Signed   By: Marijo Conception M.D.   On: 01/30/2022 16:43    Cardiac Studies   01/31/22 TTE  IMPRESSIONS     1. Abnormal septal motion inferior basal hypokinesis . Left ventricular  ejection fraction, by estimation, is 50 to 55%. The left ventricle has low  normal function. The left ventricle has no regional wall motion  abnormalities. There is moderate left  ventricular hypertrophy. Left ventricular diastolic parameters are  consistent with Grade I diastolic dysfunction (impaired relaxation).   2. Right ventricular systolic function is normal. The right ventricular  size is normal.   3. Left atrial size was mildly dilated.   4. The mitral valve is abnormal. No evidence of mitral valve  regurgitation. No evidence of mitral stenosis. Moderate mitral annular  calcification.   5. The aortic valve is tricuspid. There is mild calcification of the  aortic valve. Aortic valve regurgitation is not visualized. No aortic  stenosis is present.   6. Aortic dilatation noted. There is mild dilatation of the aortic root,  measuring  39 mm.   7. The inferior vena cava is normal in size with greater than 50%  respiratory variability, suggesting right atrial pressure of 3 mmHg.   FINDINGS   Left Ventricle: Abnormal septal motion inferior basal hypokinesis. Left  ventricular ejection fraction, by estimation, is 50 to 55%. The left  ventricle has low normal function. The left ventricle has no regional wall  motion abnormalities. The left  ventricular internal cavity size  was normal in size. There is moderate  left ventricular hypertrophy. Left ventricular diastolic parameters are  consistent with Grade I diastolic dysfunction (impaired relaxation).   Right Ventricle: The right ventricular size is normal. No increase in  right ventricular wall thickness. Right ventricular systolic function is  normal.   Left Atrium: Left atrial size was mildly dilated.   Right Atrium: Right atrial size was normal in size.   Pericardium: There is no evidence of pericardial effusion.   Mitral Valve: The mitral valve is abnormal. There is mild thickening of  the mitral valve leaflet(s). There is mild calcification of the mitral  valve leaflet(s). Moderate mitral annular calcification. No evidence of  mitral valve regurgitation. No evidence   of mitral valve stenosis. MV peak gradient, 6.4 mmHg. The mean mitral  valve gradient is 2.0 mmHg.   Tricuspid Valve: The tricuspid valve is normal in structure. Tricuspid  valve regurgitation is not demonstrated. No evidence of tricuspid  stenosis.   Aortic Valve: The aortic valve is tricuspid. There is mild calcification  of the aortic valve. Aortic valve regurgitation is not visualized. No  aortic stenosis is present. Aortic valve mean gradient measures 4.0 mmHg.  Aortic valve peak gradient measures  7.4 mmHg. Aortic valve area, by VTI measures 2.90 cm.   Pulmonic Valve: The pulmonic valve was normal in structure. Pulmonic valve  regurgitation is not visualized. No evidence of pulmonic stenosis.   Aorta: Aortic dilatation noted. There is mild dilatation of the aortic  root, measuring 39 mm.   Venous: The inferior vena cava is normal in size with greater than 50%  respiratory variability, suggesting right atrial pressure of 3 mmHg.   IAS/Shunts: The interatrial septum appears to be lipomatous. No atrial  level shunt detected by color flow Doppler.    01/31/22 LHC    Ost LAD to Prox LAD lesion is 50% stenosed.   Ost  Cx to Prox Cx lesion is 30% stenosed.   Mid RCA to Dist RCA lesion is 40% stenosed.   1st Diag lesion is 95% stenosed.   2nd Mrg lesion is 100% stenosed.   Dist RCA lesion is 60% stenosed.   Mid LAD lesion is 50% stenosed.   Previously placed Mid LAD to Dist LAD stent of unknown type is  widely patent.   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 Continue DAPT with ASA and plavix x 12 months. Reported SVT on presentation possible Type II MI from demand ischemia.   Diagnostic Dominance: Right   Patient Profile     Dean Wilson is a 61 y.o. male with a hx of multiple small cortical/subcortical infarcts diagnosed in March 2022 at which point LVEF was 45% now with  improved EF, ischemic cardiomyopathy, CAD status post DES to the mid LAD in April 2022, HLD, OSA on CPAP who is being seen 01/31/2022 for the evaluation of palpitations, elevated heart rate and elevated troponin at the request of Dr. Louanne Belton.  Patient transferred to [facility] for ischemic evaluation.  Assessment & Plan    Multivessel CAD  Patient s/p DES to LAD in April of 2022 presented to [facility] ED with palpitations, elevated HR, troponin elevation to 2405. He was transferred to [facility] for ischemic evaluation. LHC with findings of multivessel CAD, patent left main, proximal LAD ectasia, nonobstructive mid-LAD stenosis, and patency of LAD stent. Chronic occlusion of second OM branch of Lcx with right to left collaterals. Moderate diffuse distal RCA stenosis, stable with April 2022 LHC, coronary ectasia, small filling defect in area of ectasia.    Patient without acute lesion on LHC appears to have had troponin leak in the setting of type II NSTEMI with SVT (likely due to a combination of travel stress, corticosteroids, caffeine). Continue DAPT at least 12 months with ASA/Plavix.  Patient should discharge with Nitroglycerin for chest pain.  Palpitations Paroxysmal SVT  Patient with palpitations/elevated HR to the 140s with concurrent chest discomfort. On day of symptoms, patient consumed a caffeinated drink (not something he typically does). Patient also actively taking prednisone and abx for sinusitis.   No recurrence of SVT on hospital telemetry, sinus rhythm with isolated PVCs. Given that patient is very involved in his healthcare and closely monitors HR at home with a smartwatch, will defer any plans for Zio monitor to outpatient follow up. Patient denies any recent occurrence of elevated HR outside of the episode prompting this admission. He has follow up with Dr. Domenic Polite on 1/17.  HFpEF (improved) ICM  Patient with hx of LVEF down to 45% in March of 2022, now with LVEF 50-55% as of 12/27 TTE. Grade I diastolic dysfunction also noted.   Patient euvolemic on exam Continue GDMT: Coreg '25mg'$  BID Jardiance '10mg'$  Spironolactone '25mg'$  QD Entresto 97-23 mg BID PRN Torsemide  Hypertension  Patient on home regiment of Amlodipine '5mg'$ , Coreg '25mg'$  BID, Hydralazine '25mg'$  TID, Entresto 97-103 mg BID, Spironolactone '25mg'$  QD. Somewhat fluctuant BP this admission, at goal this morning. Continue above regimen at discharge.   Hyperlipidemia  Lipid panel this admission with triglycerides 150, HDL 61, LDL 19   Continue Repatha and Crestor Lipoprotein A pending  OSA  Continue nightly CPAP  For questions or updates, please contact [facility] Please consult [email or web address] for contact info under     Signed, Lily Kocher, PA-C  02/01/2022, 8:03 AM    Patient seen and examined with EW PA-C.  Agree as above, with the  following exceptions and changes as noted below. Feeling well, dressed in street clothes and anticipating going home. Gen: NAD, CV: RRR, no murmurs, Lungs: clear, Abd: soft, Extrem: Warm, well perfused, no edema, Neuro/Psych: alert and oriented x 3, normal mood and affect. All available labs, radiology testing, previous records reviewed. Pt underwent cardiac cath yesterday with stable disease and recommendation for continued medical management of CAD in setting of type II nstemi likely provoked by tachycardia, felt to be SVT. I have asked him to use his watch to monitor his rhythm as he feels this is sufficient. If atrial fibrillation is detected, I have asked him to call Dr. Myles Gip office to report and so that an ambulatory monitor can be ordered to assess burden and make further recommendations on management. He is eager to  go home, no further inpatient cardiovascular recommendations.   Elouise Munroe, MD 02/01/22 10:34 AM

## 2022-02-02 LAB — LIPOPROTEIN A (LPA): Lipoprotein (a): 49.4 nmol/L — ABNORMAL HIGH (ref <75.0)

## 2022-02-06 ENCOUNTER — Telehealth: Payer: Self-pay | Admitting: Cardiology

## 2022-02-06 NOTE — Telephone Encounter (Signed)
I spoke with patient.He states he never said he had anything draining down his arm. He has some bruising at cath site but had no drainage or swelling from site.He said after initial cath, they noted he has some bleeding under the skin and left the pressure apparatus on a bit longer.  He has apt today at 0945 with Dr.Vyas and will ask for BMP to be done per cath discharge instructions.

## 2022-02-06 NOTE — Telephone Encounter (Signed)
Pt states that he had heart cath done on 12/27 and the bandaged area has turned red in color and seems to be training down to his elbow. He states he scheduled appt with his PCP to check the site, just in case, but would like a call back to discuss this. Please advise.

## 2022-02-20 NOTE — Progress Notes (Signed)
Cardiology Office Note  Date: 02/21/2022   ID: Dean Wilson, DOB [birth year], MRN [id number]  PCP:  Glenda Chroman, MD  Cardiologist:  Rozann Lesches, MD Electrophysiologist:  None   Chief Complaint  Patient presents with   Hospitalization Follow-up    History of Present Illness: Dean Wilson is a 62 y.o. male last seen in July 2023.  I reviewed recent records, he was hospitalized in December 2023 with symptomatic SVT episode leading to demand ischemia.  He underwent cardiac catheterization showing no culprit stenosis to require revascularization, results outlined below.  Recommendation was medical therapy, DAPT  for one year if tolerated.  Peak high-sensitivity troponin I 2405 and LVEF by follow-up echocardiogram 50 to 55% range.  He is here with his wife today for follow-up.  He tells me that a week prior to the episode of SVT he had taken a steroid taper and then on the day of the event had had a significant amount of caffeine while traveling back from seeing his son in [place].  He does have a prior history of SVT, but has had no events for several years.  At baseline he reports no recurring sense of palpitations and has been tolerating a regular exercise plan otherwise.  I reviewed his medications, he reports compliance with therapy.  Blood pressure rechecked by me in the right arm at 138/80.  He has a smart watch that provides alerts for heart rate elevation.   Past Medical History:  Diagnosis Date   CAD (coronary artery disease)    DES to mid LAD April 2022   Essential hypertension    GERD (gastroesophageal reflux disease)    History of cardiomyopathy    OSA on CPAP    PSVT (paroxysmal supraventricular tachycardia)    Stroke [facility])     Current Outpatient Medications  Medication Sig Dispense Refill   amLODipine (NORVASC) 5 MG tablet Take 1 tablet by mouth daily.     ASPIRIN LOW DOSE 81 MG EC tablet Take 81 mg by mouth daily.      carvedilol (COREG) 25 MG tablet Take 25 mg by mouth 2 (two) times daily.     clopidogrel (PLAVIX) 75 MG tablet Take 75 mg by mouth daily.     DENTA 5000 PLUS 1.1 % CREA dental cream Take 1 application by mouth at bedtime.     empagliflozin (JARDIANCE) 10 MG TABS tablet Take 1 tablet (10 mg total) by mouth daily before breakfast. 30 tablet 11   Evolocumab (REPATHA) 140 MG/ML SOSY Inject 140 mg into the skin every 14 (fourteen) days. 6.3 mL 3   hydrALAZINE (APRESOLINE) 25 MG tablet Take 25 mg by mouth 3 (three) times daily.     meclizine (ANTIVERT) 25 MG tablet Take 1 tablet (25 mg total) by mouth 3 (three) times daily as needed for up to 15 doses for dizziness. 15 tablet 0   Multiple Vitamin (MULTIVITAMIN) tablet Take 1 tablet by mouth daily.     pantoprazole (PROTONIX) 40 MG tablet Take 1 tablet (40 mg total) by mouth daily. 30 tablet 11   rosuvastatin (CRESTOR) 40 MG tablet Take 1 tablet (40 mg total) by mouth daily. 30 tablet 6   sacubitril-valsartan (ENTRESTO) 97-103 MG Take 1 tablet by mouth 2 (two) times daily. 180 tablet 3   spironolactone (ALDACTONE) 25 MG tablet Take 1 tablet (25 mg total) by mouth daily. 90 tablet 1   torsemide (DEMADEX) 20 MG tablet Take 1 tablet (20 mg  total) by mouth daily as needed. 90 tablet 3   No current facility-administered medications for this visit.   Allergies:  Banana, Sunflower oil, and Tomato   ROS: No syncope.  Physical Exam: VS:  BP 138/80   Pulse 68   Ht 6' (1.829 m)   Wt 264 lb (119.7 kg)   SpO2 98%   BMI 35.80 kg/m , BMI Body mass index is 35.8 kg/m.  Wt Readings from Last 3 Encounters:  02/21/22 264 lb (119.7 kg)  02/01/22 259 lb 3.2 oz (117.6 kg)  08/09/21 259 lb 12.8 oz (117.8 kg)    General: Patient appears comfortable at rest. HEENT: Conjunctiva and lids normal. Neck: Supple, no elevated JVP or carotid bruits. Lungs: Clear to auscultation, nonlabored breathing at rest. Cardiac: Regular rate and rhythm, no S3 or significant  systolic murmur, no pericardial rub. Extremities: No pitting edema.  ECG:  An ECG dated 01/30/2022 was personally reviewed today and demonstrated:  Sinus bradycardia.  Recent Labwork: 01/30/2022: Magnesium 2.3; TSH 1.055 01/31/2022: ALT 15; AST 21; Hemoglobin 15.6; Platelets 201 02/01/2022: BUN 22; Creatinine, Ser 1.27; Potassium 3.8; Sodium 141     Component Value Date/Time   CHOL 110 01/31/2022 0118   TRIG 150 (H) 01/31/2022 0118   HDL 61 01/31/2022 0118   CHOLHDL 1.8 01/31/2022 0118   VLDL 30 01/31/2022 0118   LDLCALC 19 01/31/2022 0118    Other Studies Reviewed Today:  Echocardiogram 01/31/2022:  1. Abnormal septal motion inferior basal hypokinesis . Left ventricular  ejection fraction, by estimation, is 50 to 55%. The left ventricle has low  normal function. The left ventricle has no regional wall motion  abnormalities. There is moderate left  ventricular hypertrophy. Left ventricular diastolic parameters are  consistent with Grade I diastolic dysfunction (impaired relaxation).   2. Right ventricular systolic function is normal. The right ventricular  size is normal.   3. Left atrial size was mildly dilated.   4. The mitral valve is abnormal. No evidence of mitral valve  regurgitation. No evidence of mitral stenosis. Moderate mitral annular  calcification.   5. The aortic valve is tricuspid. There is mild calcification of the  aortic valve. Aortic valve regurgitation is not visualized. No aortic  stenosis is present.   6. Aortic dilatation noted. There is mild dilatation of the aortic root,  measuring 39 mm.   7. The inferior vena cava is normal in size with greater than 50%  respiratory variability, suggesting right atrial pressure of 3 mmHg.   Cardiac catheterization 01/31/2022:   Ost LAD to Prox LAD lesion is 50% stenosed.   Ost Cx to Prox Cx lesion is 30% stenosed.   Mid RCA to Dist RCA lesion is 40% stenosed.   1st Diag lesion is 95% stenosed.   2nd Mrg lesion  is 100% stenosed.   Dist RCA lesion is 60% stenosed.   Mid LAD lesion is 50% stenosed.   Previously placed Mid LAD to Dist LAD stent of unknown type is  widely patent.   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 Continue DAPT with ASA and plavix x 12 months. Reported SVT on presentation possible Type II MI  from demand ischemia.   Assessment and Plan:  1.  Recent type II NSTEMI in the setting of symptomatic SVT.  We discussed the results of his cardiac testing.  Cardiac catheterization demonstrated no culprit stenosis with recommendation for medical therapy, LVEF 50 to 55% by echocardiography.  He reports no recurrent symptoms.  We discussed limiting caffeine and for now continuing observation.  If he has further palpitations or alerts from his smart watch, Zio patch would be placed for further investigation.  He is on a good dose of Coreg and his resting heart rate is in the 60s today.  2.  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  He reports NYHA class I dyspnea at this time and shows no fluid overload.  Continue current medical therapy including Coreg, Jardiance, hydralazine, Entresto, Aldactone, and Demadex.  3.  CAD status post DES to the LAD in April 2022.  Recent cardiac catheterization showed patent stent site and otherwise medically managed residual disease.  He reports no active angina, is on DAPT for 12 months if tolerated.  Currently on Repatha and Crestor, LDL 19.  4.  OSA on CPAP.  Medication Adjustments/Labs and Tests Ordered: Current medicines are  reviewed at length with the patient today.  Concerns regarding medicines are outlined above.   Tests Ordered: No orders of the defined types were placed in this encounter.   Medication Changes: No orders of the defined types were placed in this encounter.   Disposition:  Follow up  6 months.  Signed, Satira Sark, MD, [facility] 02/21/2022 9:22 AM    [facility] [place], [place], [place] [address] Phone: [phone]; Fax: [phone]

## 2022-02-21 ENCOUNTER — Encounter: Payer: Self-pay | Admitting: Cardiology

## 2022-02-21 ENCOUNTER — Ambulatory Visit: Payer: BC Managed Care – PPO | Attending: Cardiology | Admitting: Cardiology

## 2022-02-21 VITALS — BP 138/80 | HR 68 | Ht 72.0 in | Wt 264.0 lb

## 2022-02-21 DIAGNOSIS — I255 Ischemic cardiomyopathy: Secondary | ICD-10-CM

## 2022-02-21 DIAGNOSIS — I25119 Atherosclerotic heart disease of native coronary artery with unspecified angina pectoris: Secondary | ICD-10-CM | POA: Diagnosis not present

## 2022-02-21 DIAGNOSIS — E782 Mixed hyperlipidemia: Secondary | ICD-10-CM | POA: Diagnosis not present

## 2022-02-21 DIAGNOSIS — I471 Supraventricular tachycardia, unspecified: Secondary | ICD-10-CM

## 2022-02-21 NOTE — Patient Instructions (Addendum)

## 2023-02-25 ENCOUNTER — Ambulatory Visit: Payer: BC Managed Care – PPO | Attending: Cardiology | Admitting: Cardiology

## 2023-02-25 ENCOUNTER — Encounter: Payer: Self-pay | Admitting: Cardiology

## 2023-02-25 VITALS — BP 138/84 | HR 62 | Ht 72.0 in | Wt 274.4 lb

## 2023-02-25 DIAGNOSIS — I5032 Chronic diastolic (congestive) heart failure: Secondary | ICD-10-CM

## 2023-02-25 DIAGNOSIS — I25119 Atherosclerotic heart disease of native coronary artery with unspecified angina pectoris: Secondary | ICD-10-CM

## 2023-02-25 DIAGNOSIS — E782 Mixed hyperlipidemia: Secondary | ICD-10-CM

## 2023-02-25 DIAGNOSIS — I10 Essential (primary) hypertension: Secondary | ICD-10-CM

## 2023-02-25 NOTE — Progress Notes (Signed)
    Cardiology Office Note  Date: 02/25/2023   ID: EVAAN READINGER, DOB [birth year], MRN [id number]  History of Present Illness: Dean Wilson is a 63 y.o. male last seen in January 2024.  He is here today with his wife for a follow-up visit.  Reports no angina or nitroglycerin use.  States that sometimes he feels like he has "extra fluid" and has been using his Demadex intermittently.  His weight is up over the last year by about 10 pounds, although he has not been exercising as regularly.  He does not report any leg swelling, no orthopnea or PND.  I reviewed his medications.  Current cardiac regimen includes aspirin, Plavix, Norvasc, Coreg, Jardiance, hydralazine, Crestor, Entresto, Aldactone, and Demadex as needed.  He did stop taking Repatha about 6 or 7 months ago, concerned about the cost.  His last LDL was down to 19 in December 2023 and he has follow-up lab work with PCP next month.  I reviewed his ECG today which shows normal sinus rhythm, old inferior infarct pattern.  Physical Exam: VS:  BP 138/84   Pulse 62   Ht 6' (1.829 m)   Wt 274 lb 6.4 oz (124.5 kg)   SpO2 98%   BMI 37.22 kg/m , BMI Body mass index is 37.22 kg/m.  Wt Readings from Last 3 Encounters:  02/25/23 274 lb 6.4 oz (124.5 kg)  02/21/22 264 lb (119.7 kg)  02/01/22 259 lb 3.2 oz (117.6 kg)    General: Patient appears comfortable at rest. HEENT: Conjunctiva and lids normal. Neck: Supple, no elevated JVP or carotid bruits. Lungs: Clear to auscultation, nonlabored breathing at rest. Cardiac: Regular rate and rhythm, no S3 or significant systolic murmur. Abdomen: Bowel sounds present, no bruits. Extremities: No pitting edema.  ECG:  An ECG dated 01/30/2022 was personally reviewed today and demonstrated:  Sinus bradycardia.  Labwork:    Component Value Date/Time   CHOL 110 01/31/2022 0118   TRIG 150 (H) 01/31/2022 0118   HDL 61 01/31/2022 0118   CHOLHDL 1.8 01/31/2022 0118   VLDL 30 01/31/2022  0118   LDLCALC 19 01/31/2022 0118   Other Studies Reviewed Today:  No interval cardiac testing for review today.  Assessment and Plan:  1.  CAD status post DES to the mid LAD in April 2022.  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  He does not describe any increasing angina on medical therapy.  ECG reviewed and stable.  Continue aspirin, stop Plavix at this point.  Continue Jardiance and Crestor.  I recommended that he get back to his regular exercise plan.  2.  PSVT.  Has experienced some brief episodes, but no prolonged palpitations.  Continue Coreg.  3.  Primary hypertension.  No change in current regimen.  4.  OSA on CPAP.  He reports compliance with treatment.  5.  HFrecEF, LVEF 50 to 55% with inferior basal hypokinesis by echocardiogram in December 2023.  Plan to update echocardiogram.  Currently on Coreg, Jardiance, Entresto, Aldactone, and as needed Demadex.  Disposition:  Follow up  6 months.  Signed, Jonelle Sidle, M.D., F.A.C.C[facility]

## 2023-02-25 NOTE — Patient Instructions (Signed)
 Medication Instructions:   Continue all current medications.   Labwork:  none  Testing/Procedures:  Your physician has requested that you have an echocardiogram. Echocardiography is a painless test that uses sound waves to create images of your heart. It provides your doctor with information about the size and shape of your heart and how well your heart's chambers and valves are working. This procedure takes approximately one hour. There are no restrictions for this procedure. Please do NOT wear cologne, perfume, aftershave, or lotions (deodorant is allowed). Please arrive 15 minutes prior to your appointment time.  Please note: We ask at that you not bring children with you during ultrasound (echo/ vascular) testing. Due to room size and safety concerns, children are not allowed in the ultrasound rooms during exams. Our front office staff cannot provide observation of children in our lobby area while testing is being conducted. An adult accompanying a patient to their appointment will only be allowed in the ultrasound room at the discretion of the ultrasound technician under special circumstances. We apologize for any inconvenience. Office will contact with results via phone, letter or mychart.     Follow-Up:  6 months   Any Other Special Instructions Will Be Listed Below (If Applicable).   If you need a refill on your cardiac medications before your next appointment, please call your pharmacy.

## 2023-03-20 ENCOUNTER — Ambulatory Visit: Payer: BC Managed Care – PPO | Attending: Cardiology

## 2023-03-20 ENCOUNTER — Telehealth: Payer: Self-pay | Admitting: Cardiology

## 2023-03-20 DIAGNOSIS — I25119 Atherosclerotic heart disease of native coronary artery with unspecified angina pectoris: Secondary | ICD-10-CM | POA: Diagnosis not present

## 2023-03-20 MED ORDER — TORSEMIDE 20 MG PO TABS: 20.0000 mg | ORAL_TABLET | Freq: Every day | ORAL | 3 refills | Status: AC | PRN

## 2023-03-20 MED ORDER — ASPIRIN LOW DOSE 81 MG PO TBEC: 81.0000 mg | DELAYED_RELEASE_TABLET | Freq: Every day | ORAL | 11 refills | Status: DC

## 2023-03-20 MED ORDER — SPIRONOLACTONE 25 MG PO TABS: 25.0000 mg | ORAL_TABLET | Freq: Every day | ORAL | 1 refills | Status: AC

## 2023-03-20 MED ORDER — SACUBITRIL-VALSARTAN 97-103 MG PO TABS: 1.0000 | ORAL_TABLET | Freq: Two times a day (BID) | ORAL | 3 refills | Status: AC

## 2023-03-20 MED ORDER — EMPAGLIFLOZIN 10 MG PO TABS: 10.0000 mg | ORAL_TABLET | Freq: Every day | ORAL | 11 refills | Status: AC

## 2023-03-20 MED ORDER — HYDRALAZINE HCL 25 MG PO TABS: 25.0000 mg | ORAL_TABLET | Freq: Three times a day (TID) | ORAL | 3 refills | Status: DC

## 2023-03-20 MED ORDER — ROSUVASTATIN CALCIUM 40 MG PO TABS: 40.0000 mg | ORAL_TABLET | Freq: Every day | ORAL | 6 refills | Status: AC

## 2023-03-20 MED ORDER — CARVEDILOL 25 MG PO TABS: 25.0000 mg | ORAL_TABLET | Freq: Two times a day (BID) | ORAL | 3 refills | Status: DC

## 2023-03-20 NOTE — Telephone Encounter (Signed)
Pt is in office today for testing he stated he has sever rx's that need refilling: Entresto, Jardiance, Crestor, Norvasc, Protonix, Hydralazine, Plavix, and Coreg. He stated that he would like them sent to his mail order pharmacy which he believes is [facility]

## 2023-03-20 NOTE — Telephone Encounter (Signed)
Pt also stated he was supposed to have an annual wellness visit w/his PCP but it keeps getting pushed back. Currently it is scheduled for May.

## 2023-03-20 NOTE — Telephone Encounter (Signed)
Medication refills sent to patients mail order pharmacy.   Pt is currently not taking Plavix.

## 2023-03-21 LAB — ECHOCARDIOGRAM COMPLETE
AR max vel: 2.44 cm2
AV Area VTI: 2.36 cm2
AV Area mean vel: 2.03 cm2
AV Mean grad: 5 mm[Hg]
AV Peak grad: 7.6 mm[Hg]
Ao pk vel: 1.38 m/s
Area-P 1/2: 2.71 cm2
Calc EF: 67.7 %
MV VTI: 2.01 cm2
S' Lateral: 2.7 cm
Single Plane A2C EF: 69.9 %
Single Plane A4C EF: 63.6 %

## 2023-04-18 ENCOUNTER — Other Ambulatory Visit: Payer: Self-pay | Admitting: *Deleted

## 2023-04-18 MED ORDER — AMLODIPINE BESYLATE 5 MG PO TABS: 5.0000 mg | ORAL_TABLET | Freq: Every day | ORAL | 1 refills | Status: DC

## 2023-06-08 ENCOUNTER — Encounter (HOSPITAL_COMMUNITY): Payer: Self-pay | Admitting: Emergency Medicine

## 2023-06-08 ENCOUNTER — Emergency Department (HOSPITAL_COMMUNITY)
Admission: EM | Admit: 2023-06-08 | Discharge: 2023-06-08 | Disposition: A | Discharge status: Home / Self Care | Attending: Student | Admitting: Student

## 2023-06-08 ENCOUNTER — Emergency Department (HOSPITAL_COMMUNITY)

## 2023-06-08 ENCOUNTER — Other Ambulatory Visit: Payer: Self-pay

## 2023-06-08 DIAGNOSIS — I5023 Acute on chronic systolic (congestive) heart failure: Secondary | ICD-10-CM | POA: Diagnosis not present

## 2023-06-08 DIAGNOSIS — Z7982 Long term (current) use of aspirin: Secondary | ICD-10-CM | POA: Diagnosis not present

## 2023-06-08 DIAGNOSIS — I2510 Atherosclerotic heart disease of native coronary artery without angina pectoris: Secondary | ICD-10-CM | POA: Diagnosis not present

## 2023-06-08 DIAGNOSIS — Z8673 Personal history of transient ischemic attack (TIA), and cerebral infarction without residual deficits: Secondary | ICD-10-CM | POA: Diagnosis not present

## 2023-06-08 DIAGNOSIS — Z79899 Other long term (current) drug therapy: Secondary | ICD-10-CM | POA: Diagnosis not present

## 2023-06-08 DIAGNOSIS — Z87891 Personal history of nicotine dependence: Secondary | ICD-10-CM | POA: Diagnosis not present

## 2023-06-08 DIAGNOSIS — Z7902 Long term (current) use of antithrombotics/antiplatelets: Secondary | ICD-10-CM | POA: Diagnosis not present

## 2023-06-08 DIAGNOSIS — E860 Dehydration: Secondary | ICD-10-CM

## 2023-06-08 DIAGNOSIS — N1831 Chronic kidney disease, stage 3a: Secondary | ICD-10-CM | POA: Diagnosis not present

## 2023-06-08 DIAGNOSIS — I130 Hypertensive heart and chronic kidney disease with heart failure and stage 1 through stage 4 chronic kidney disease, or unspecified chronic kidney disease: Secondary | ICD-10-CM | POA: Diagnosis not present

## 2023-06-08 DIAGNOSIS — R002 Palpitations: Secondary | ICD-10-CM | POA: Insufficient documentation

## 2023-06-08 LAB — BASIC METABOLIC PANEL WITH GFR
Anion gap: 15 (ref 5–15)
BUN: 41 mg/dL — ABNORMAL HIGH (ref 8–23)
CO2: 17 mmol/L — ABNORMAL LOW (ref 22–32)
Calcium: 9 mg/dL (ref 8.9–10.3)
Chloride: 106 mmol/L (ref 98–111)
Creatinine, Ser: 1.73 mg/dL — ABNORMAL HIGH (ref 0.61–1.24)
GFR, Estimated: 44 mL/min — ABNORMAL LOW (ref >60)
Glucose, Bld: 109 mg/dL — ABNORMAL HIGH (ref 70–99)
Potassium: 4.4 mmol/L (ref 3.5–5.1)
Sodium: 138 mmol/L (ref 135–145)

## 2023-06-08 LAB — CBC
HCT: 51.2 % (ref 39.0–52.0)
Hemoglobin: 16.5 g/dL (ref 13.0–17.0)
MCH: 27.7 pg (ref 26.0–34.0)
MCHC: 32.2 g/dL (ref 30.0–36.0)
MCV: 86.1 fL (ref 80.0–100.0)
Platelets: 173 10*3/uL (ref 150–400)
RBC: 5.95 MIL/uL — ABNORMAL HIGH (ref 4.22–5.81)
RDW: 15.1 % (ref 11.5–15.5)
WBC: 9 10*3/uL (ref 4.0–10.5)
nRBC: 0 % (ref 0.0–0.2)

## 2023-06-08 LAB — TROPONIN I (HIGH SENSITIVITY)
Troponin I (High Sensitivity): 12 ng/L (ref <18)
Troponin I (High Sensitivity): 37 ng/L — ABNORMAL HIGH (ref <18)
Troponin I (High Sensitivity): 43 ng/L — ABNORMAL HIGH (ref <18)

## 2023-06-08 MED ORDER — LACTATED RINGERS IV BOLUS
1000.0000 mL | Freq: Once | INTRAVENOUS | Status: AC
  Administered 2023-06-08: 1000 mL via INTRAVENOUS

## 2023-06-08 MED ORDER — PROPRANOLOL HCL 10 MG PO TABS: 10.0000 mg | ORAL_TABLET | ORAL | 0 refills | Status: DC | PRN

## 2023-06-08 NOTE — ED Provider Notes (Signed)
 [place] EMERGENCY DEPARTMENT AT [facility] Provider Note  CSN: [id number] Arrival date & time: 06/08/23 4098  Chief Complaint(s) Palpitations  HPI Dean Wilson is a 63 y.o. male with PMH CAD status post DES placement on Plavix , HTN, paroxysmal SVT, previous CVA who presents emergency room for evaluation of rapid heart rate chest pain and shortness of breath.  States that this morning upon awakening he felt palpitations and shortness of breath.  His smart watch showed a heart rate of 140.  Was lightheaded and diaphoretic at that time.  His symptoms have since resolved here in the emergency department and currently he is denying any chest pain, shortness of breath, abdominal pain, nausea, vomiting or other systemic symptoms.   Past Medical History Past Medical History:  Diagnosis Date   CAD (coronary artery disease)    DES to mid LAD April 2022   Essential hypertension    GERD (gastroesophageal reflux disease)    History of cardiomyopathy    OSA on CPAP    PSVT (paroxysmal supraventricular tachycardia) (HCC)    Stroke [facility])    Patient Active Problem List   Diagnosis Date Noted   Obesity (BMI 30-39.9) 02/01/2022   OSA (obstructive sleep apnea) 02/01/2022   Stage 3a chronic kidney disease (CKD) (HCC) 02/01/2022   Paroxysmal supraventricular tachycardia (HCC) 01/31/2022   Essential hypertension 01/31/2022   Mixed hyperlipidemia 01/31/2022   Non-ST elevation (NSTEMI) myocardial infarction (HCC) 01/31/2022   Chest pain 01/30/2022   History of CVA (cerebrovascular accident) 05/18/2020   Chronic systolic congestive heart failure (HCC) 05/18/2020   Nocturnal hypoxia 05/06/2020   Acute systolic congestive heart failure (HCC) 05/06/2020   Mild mitral regurgitation 05/06/2020   Hypokalemia 05/06/2020   Hypertensive emergency 05/05/2020   Gastritis 01/26/2011   Home Medication(s) Prior to Admission medications   Medication Sig Start Date End Date Taking? Authorizing  Provider  amLODipine  (NORVASC ) 5 MG tablet Take 1 tablet (5 mg total) by mouth daily. 04/18/23   Gerard Knight, MD  ASPIRIN  LOW DOSE 81 MG tablet Take 1 tablet (81 mg total) by mouth daily. 03/20/23   Gerard Knight, MD  carvedilol  (COREG ) 25 MG tablet Take 1 tablet (25 mg total) by mouth 2 (two) times daily. 03/20/23   Gerard Knight, MD  chlorhexidine (PERIDEX) 0.12 % solution Use as directed in the mouth or throat daily as needed. 12/06/22   [provider]  clopidogrel  (PLAVIX ) 75 MG tablet Take 75 mg by mouth daily. 04/29/20   [provider]  DENTA 5000 PLUS 1.1 % CREA dental cream Take 1 application by mouth at bedtime. 04/21/20   [provider]  empagliflozin  (JARDIANCE ) 10 MG TABS tablet Take 1 tablet (10 mg total) by mouth daily before breakfast. 03/20/23   Gerard Knight, MD  hydrALAZINE  (APRESOLINE ) 25 MG tablet Take 1 tablet (25 mg total) by mouth 3 (three) times daily. 03/20/23   Gerard Knight, MD  meclizine  (ANTIVERT ) 25 MG tablet Take 1 tablet (25 mg total) by mouth 3 (three) times daily as needed for up to 15 doses for dizziness. 10/07/20   Billie Budge, MD  Multiple Vitamin (MULTIVITAMIN) tablet Take 1 tablet by mouth daily.    [provider]  pantoprazole  (PROTONIX ) 40 MG tablet Take 1 tablet (40 mg total) by mouth daily. 01/26/11 02/25/23  Ruby Corporal, MD  rosuvastatin  (CRESTOR ) 40 MG tablet Take 1 tablet (40 mg total) by mouth daily. 03/20/23   Gerard Knight,  MD  sacubitril -valsartan  (ENTRESTO ) 97-103 MG Take 1 tablet by mouth 2 (two) times daily. 03/20/23   Gerard Knight, MD  spironolactone  (ALDACTONE ) 25 MG tablet Take 1 tablet (25 mg total) by mouth daily. 03/20/23   Gerard Knight, MD  torsemide  (DEMADEX ) 20 MG tablet Take 1 tablet (20 mg total) by mouth daily as needed. 03/20/23   Gerard Knight, MD                                                                                                                                     Past Surgical History Past Surgical History:  Procedure Laterality Date   CORONARY STENT INTERVENTION N/A 05/20/2020   Procedure: CORONARY STENT INTERVENTION;  Surgeon: Odie Benne, MD;  Location: [facility] INVASIVE CV LAB;  Service: Cardiovascular;  Laterality: N/A;   LEFT HEART CATH AND CORONARY ANGIOGRAPHY N/A 01/31/2022   Procedure: LEFT HEART CATH AND CORONARY ANGIOGRAPHY;  Surgeon: Arnoldo Lapping, MD;  Location: [facility] INVASIVE CV LAB;  Service: Cardiovascular;  Laterality: N/A;   RIGHT/LEFT HEART CATH AND CORONARY ANGIOGRAPHY N/A 05/20/2020   Procedure: RIGHT/LEFT HEART CATH AND CORONARY ANGIOGRAPHY;  Surgeon: Mardell Shade, MD;  Location: [facility] INVASIVE CV LAB;  Service: Cardiovascular;  Laterality: N/A;   UMBILICAL HERNIA REPAIR     Family History Family History  Problem Relation Age of Onset   Anesthesia problems Neg Hx    Hypotension Neg Hx    Malignant hyperthermia Neg Hx    Pseudochol deficiency Neg Hx     Social History Social History   Tobacco Use   Smoking status: Former    Current packs/day: 0.00    Average packs/day: 2.0 packs/day for 25.0 years (50.0 ttl pk-yrs)    Types: Cigarettes    Start date: 01/26/1983    Quit date: 01/26/2008    Years since quitting: 15.3   Smokeless tobacco: Never  Vaping Use   Vaping status: Never Used  Substance Use Topics   Alcohol use: Never   Drug use: No   Allergies Banana, Sunflower oil, and Tomato  Review of Systems Review of Systems  Cardiovascular:  Positive for chest pain and palpitations.    Physical Exam Vital Signs  I have reviewed the triage vital signs BP 127/82   Pulse (!) 58   Temp 97.6 F (36.4 C) (Oral)   Resp 20   Ht 6' (1.829 m)   Wt 120.2 kg   SpO2 99%   BMI 35.94 kg/m   Physical Exam Constitutional:      General: He is not in acute distress.    Appearance: Normal appearance.  HENT:     Head: Normocephalic and atraumatic.     Nose: No congestion or  rhinorrhea.  Eyes:     General:        Right eye: No discharge.        Left eye: No discharge.  Extraocular Movements: Extraocular movements intact.     Pupils: Pupils are equal, round, and reactive to light.  Cardiovascular:     Rate and Rhythm: Normal rate and regular rhythm.     Heart sounds: No murmur heard. Pulmonary:     Effort: No respiratory distress.     Breath sounds: No wheezing or rales.  Abdominal:     General: There is no distension.     Tenderness: There is no abdominal tenderness.  Musculoskeletal:        General: Normal range of motion.     Cervical back: Normal range of motion.  Skin:    General: Skin is warm and dry.  Neurological:     General: No focal deficit present.     Mental Status: He is alert.     ED Results and Treatments Labs (all labs ordered are listed, but only abnormal results are displayed) Labs Reviewed  BASIC METABOLIC PANEL WITH GFR - Abnormal; Notable for the following components:      Result Value   CO2 17 (*)    Glucose, Bld 109 (*)    BUN 41 (*)    Creatinine, Ser 1.73 (*)    GFR, Estimated 44 (*)    All other components within normal limits  CBC - Abnormal; Notable for the following components:   RBC 5.95 (*)    All other components within normal limits  TROPONIN I (HIGH SENSITIVITY) - Abnormal; Notable for the following components:   Troponin I (High Sensitivity) 37 (*)    All other components within normal limits  TROPONIN I (HIGH SENSITIVITY)  TROPONIN I (HIGH SENSITIVITY)                                                                                                                          Radiology CT CHEST WO CONTRAST Result Date: 06/08/2023 CLINICAL DATA:  63 year old male with palpitations and abnormal lung opacity on chest x-rays. EXAM: CT CHEST WITHOUT CONTRAST TECHNIQUE: Multidetector CT imaging of the chest was performed following the standard protocol without IV contrast. RADIATION DOSE REDUCTION: This exam  was performed according to the departmental dose-optimization program which includes automated exposure control, adjustment of the mA and/or kV according to patient size and/or use of iterative reconstruction technique. COMPARISON:  Chest x-rays today. Report of CTA chest 05/13/2018 (no images available). FINDINGS: Cardiovascular: Calcified aortic atherosclerosis. Extensive calcified coronary artery atherosclerosis (series 3, image 86). No cardiomegaly or pericardial effusion. Vascular patency is not evaluated in the absence of IV contrast. Mediastinum/Nodes: No mediastinal mass. Mediastinal lymph nodes are within normal limits. Lungs/Pleura: Fine peribronchial calcifications in the anterior basal segment of the right lower lobe appear to be postinflammatory. There is a left lower lobe 5 x 8 mm lung nodule which is associated with linear streaky opacity and is mostly calcified, postinflammatory and also this was described in 2020 (no follow-up imaging recommended). Indistinct ground-glass opacity in the right upper lobe is most confluent in an  area of 3-4 cm on series 4, image 46 and series 6, image 115. Mild contralateral anterior left upper lobe subpleural scarring. No consolidation. No pleural effusion. Major airways are patent. Upper Abdomen: Negative visible noncontrast liver, gallbladder, spleen, pancreas, adrenal glands and bowel in the upper abdomen. Partially visible bilateral nephrolithiasis. Musculoskeletal: Thoracic spine degeneration. No acute or suspicious osseous lesion identified. IMPRESSION: 1. Indistinct ground-glass opacity in the right upper lobe is most confluent in an area of 3-4 cm. Per Fleischner Society Guidelines, recommend a non-contrast Chest CT at 6 months to confirm persistence, then additional non-contrast Chest CTs every 2 years until 5 years. If nodule grows or develops solid component(s), consider resection. These guidelines do not apply to immunocompromised patients and patients  with cancer. Follow up in patients with significant comorbidities as clinically warranted. For lung cancer screening, adhere to Lung-RADS guidelines. Reference: Radiology. 2017; 284(1):228-43. 2. No other acute process identified in the noncontrast Chest. Small calcified and benign postinflammatory nodules in the lower lobes. 3. Extensive calcified coronary artery, Aortic Atherosclerosis (ICD10-I70.0). 4. Partially visible bilateral nephrolithiasis. Electronically Signed   By: Marlise Simpers M.D.   On: 06/08/2023 10:41   DG Chest 2 View Result Date: 06/08/2023 CLINICAL DATA:  Palpitations. EXAM: CHEST - 2 VIEW COMPARISON:  01/30/2022 FINDINGS: Ill-defined nodular density identified right lower lung. Streaky density in the lateral left base is probably atelectasis or scarring. No pulmonary edema or substantial pleural effusion. Hyperexpansion suggests emphysema. The cardiopericardial silhouette is within normal limits for size. No acute bony abnormality. Dip down IMPRESSION: 1. Ill-defined nodular density right lower lung. CT chest without contrast recommended to further evaluate. 2. Streaky density in the lateral left base is probably atelectasis or scarring. Electronically Signed   By: Donnal Fusi M.D.   On: 06/08/2023 08:32    Pertinent labs & imaging results that were available during my care of the patient were reviewed by me and considered in my medical decision making (see MDM for details).  Medications Ordered in ED Medications  lactated ringers bolus 1,000 mL (1,000 mLs Intravenous New Bag/Given 06/08/23 1233)                                                                                                                                     Procedures Procedures  (including critical care time)  Medical Decision Making / ED Course   This patient presents to the ED for concern of palpitations, this involves an extensive number of treatment options, and is a complaint that carries with it a high  risk of complications and morbidity.  The differential diagnosis includes SVT, A-fib, a flutter, ACS, dehydration, electrolyte abnormality  MDM: Patient seen emergency room for evaluation of chest discomfort and palpitations.  Physical exam is unremarkable here in the emergency room today.  Laboratory evaluation with a BUN of 41, creatinine 1.73 which is a mild elevation for this patient, CO2 17, initial high-sensitivity  troponin is normal but delta troponin 37.  I do suspect the patient likely had an episode of paroxysmal SVT today and this is likely a troponin leak from type II demand ischemia and thus a third troponin was obtained that was only minimally elevated at 43.  I spoke with the cardiologist on-call Dr. Aloha Jakes who is recommending as needed Inderal and outpatient follow-up with Dr. Londa Rival, possible EP consultation in the outpatient setting.  Of note, patient also had a chest x-ray that showed an ill-defined density in the right lower lobe and thus a follow-up chest CT was obtained that shows an indistinct groundglass opacity in the right upper lobe.  Patient states that he did recently have a viral illness that he has since getting over.  This may be remnants of this disease but at this time he is not respirator stress and has no shortness of breath here.  We will defer antibiotic administration at this time.  Patient was observed for 8 hours in the emergency department and did not have an additional episode of SVT.  He was given return precautions of which he voiced understanding and outpatient cardiology referral was placed to Dr. Londa Rival.     Additional history obtained: -Additional history obtained from wife -External records from outside source obtained and reviewed including: Chart review including previous notes, labs, imaging, consultation notes   Lab Tests: -I ordered, reviewed, and interpreted labs.   The pertinent results include:   Labs Reviewed  BASIC METABOLIC PANEL WITH GFR  - Abnormal; Notable for the following components:      Result Value   CO2 17 (*)    Glucose, Bld 109 (*)    BUN 41 (*)    Creatinine, Ser 1.73 (*)    GFR, Estimated 44 (*)    All other components within normal limits  CBC - Abnormal; Notable for the following components:   RBC 5.95 (*)    All other components within normal limits  TROPONIN I (HIGH SENSITIVITY) - Abnormal; Notable for the following components:   Troponin I (High Sensitivity) 37 (*)    All other components within normal limits  TROPONIN I (HIGH SENSITIVITY)  TROPONIN I (HIGH SENSITIVITY)      EKG   EKG Interpretation Date/Time:  Saturday Jun 08 2023 07:54:57 EDT Ventricular Rate:  83 PR Interval:  164 QRS Duration:  90 QT Interval:  376 QTC Calculation: 441 R Axis:   85  Text Interpretation: Normal sinus rhythm Normal ECG When compared with ECG of 25-Feb-2023 15:40, PREVIOUS ECG IS PRESENT Confirmed by Holley Wirt ([id number]) on 06/08/2023 11:03:44 AM         Imaging Studies ordered: I ordered imaging studies including chest x-ray, chest CT I independently visualized and interpreted imaging. I agree with the radiologist interpretation   Medicines ordered and prescription drug management: Meds ordered this encounter  Medications   lactated ringers bolus 1,000 mL    -I have reviewed the patients home medicines and have made adjustments as needed  Critical interventions none  Consultations Obtained: I requested consultation with the cardiologist on-call Dr. Stann Earnest,  and discussed lab and imaging findings as well as pertinent plan - they recommend: prn Inderal, outpatient cardiology follow-up   Cardiac Monitoring: The patient was maintained on a cardiac monitor.  I personally viewed and interpreted the cardiac monitored which showed an underlying rhythm of: NSR  Social Determinants of Health:  Factors impacting patients care include: none   Reevaluation: After the interventions noted above,  I  reevaluated the patient and found that they have :stayed the same  Co morbidities that complicate the patient evaluation  Past Medical History:  Diagnosis Date   CAD (coronary artery disease)    DES to mid LAD April 2022   Essential hypertension    GERD (gastroesophageal reflux disease)    History of cardiomyopathy    OSA on CPAP    PSVT (paroxysmal supraventricular tachycardia) (HCC)    Stroke [facility])       Dispostion: I considered admission for this patient, but at this time he does not meet inpatient criteria for admission and will be discharged outpatient follow-up     Final Clinical Impression(s) / ED Diagnoses Final diagnoses:  None     @PCDICTATION @    Karlyn Overman, MD 06/08/23 [phone]

## 2023-06-08 NOTE — ED Triage Notes (Signed)
 Pt reports heart racing sensation this morning upon waking. Pt states his watch alerted him that his HR was 140. During the episode pt felt light headed, dizzy and was diaphoretic. Pt with hx of MI with stent.

## 2023-06-08 NOTE — ED Provider Triage Note (Signed)
 Emergency Medicine Provider Triage Evaluation Note  Dean Wilson , a 63 y.o. male  was evaluated in triage.  Pt complains of heart rate going up to 140. Pt also had discomfort, Feels like reflux   Review of Systems  Positive: Elevated heart rate Negative: No cough  Physical Exam  BP 101/70 (BP Location: Right Arm)   Pulse 85   Temp 97.6 F (36.4 C) (Oral)   Resp 18   Ht 6' (1.829 m)   Wt 120.2 kg   SpO2 98%   BMI 35.94 kg/m  Gen:   Awake, no distress   Resp:  Normal effort  MSK:   Moves extremities without difficulty  Other:    Medical Decision Making  Medically screening exam initiated at 9:32 AM.  Appropriate orders placed.  Dean Wilson was informed that the remainder of the evaluation will be completed by another provider, this initial triage assessment does not replace that evaluation, and the importance of remaining in the ED until their evaluation is complete.     Sandi Crosby, PA-C 06/08/23 [phone]

## 2023-06-09 ENCOUNTER — Other Ambulatory Visit: Payer: Self-pay | Admitting: Cardiology

## 2023-06-10 ENCOUNTER — Encounter: Payer: Self-pay | Admitting: Physician Assistant

## 2023-06-10 ENCOUNTER — Ambulatory Visit: Attending: Physician Assistant | Admitting: Physician Assistant

## 2023-06-10 ENCOUNTER — Telehealth: Payer: Self-pay | Admitting: *Deleted

## 2023-06-10 VITALS — BP 140/84 | HR 80 | Ht 72.0 in | Wt 270.4 lb

## 2023-06-10 DIAGNOSIS — I10 Essential (primary) hypertension: Secondary | ICD-10-CM | POA: Diagnosis not present

## 2023-06-10 DIAGNOSIS — R911 Solitary pulmonary nodule: Secondary | ICD-10-CM

## 2023-06-10 DIAGNOSIS — I25119 Atherosclerotic heart disease of native coronary artery with unspecified angina pectoris: Secondary | ICD-10-CM

## 2023-06-10 DIAGNOSIS — I471 Supraventricular tachycardia, unspecified: Secondary | ICD-10-CM

## 2023-06-10 DIAGNOSIS — I5032 Chronic diastolic (congestive) heart failure: Secondary | ICD-10-CM | POA: Diagnosis not present

## 2023-06-10 DIAGNOSIS — G4733 Obstructive sleep apnea (adult) (pediatric): Secondary | ICD-10-CM

## 2023-06-10 DIAGNOSIS — N1831 Chronic kidney disease, stage 3a: Secondary | ICD-10-CM

## 2023-06-10 DIAGNOSIS — E782 Mixed hyperlipidemia: Secondary | ICD-10-CM

## 2023-06-10 DIAGNOSIS — E669 Obesity, unspecified: Secondary | ICD-10-CM

## 2023-06-10 DIAGNOSIS — Z79899 Other long term (current) drug therapy: Secondary | ICD-10-CM

## 2023-06-10 MED ORDER — EZETIMIBE 10 MG PO TABS: 10.0000 mg | ORAL_TABLET | Freq: Every day | ORAL | 3 refills | Status: AC

## 2023-06-10 NOTE — Progress Notes (Signed)
 Cardiology Office Note:  .   Date:  06/10/2023  ID:  Dean Wilson, DOB May 01, 1960, MRN [id number] PCP: Orlena Bitters, MD  [facility] Providers Cardiologist:  Teddie Favre, MD    History of Present Illness: Dean Wilson   Dean Wilson is a 63 y.o. male with history of CAD status post DES to the mid LAD in April 2022.  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 He also has PVST, HTN, OSA on CPAP, HFrecEF.  Patient was in the ED 06/08/23 with palpitations chest pain and SOB that awakened him from sleep. Watch showed HR 140/m, symptoms resolved in ED and EKG normal. H-S troponin normal, delta troponin 37 & 43. Felt he had SVT with demand ischemia. They spoke with Dr. Nishan who recommended prn Inderal and possible EPS eval. All labs, EKG  and testing from ED reviewed. Hasn't had any further episodes since. He was dehydrated, had a few beers the night before. He walks treadmill and bike at gym 30 min 3 days a week. No angina. He's had palpitations about once a month and can do deep breathing and it goes away. This was the first time it woke him up form sleep.   ROS:    Studies Reviewed: Dean Wilson         Prior CV Studies:   Echo 03/2023 IMPRESSIONS     1. Left ventricular ejection fraction, by estimation, is 55 to 60%. The  left ventricle has normal function. The left ventricle has no regional  wall motion abnormalities. There is mild left ventricular hypertrophy.  Left ventricular diastolic parameters  are consistent with Grade I diastolic dysfunction (impaired relaxation).  Elevated left atrial pressure.   2. Right ventricular systolic function is normal. The right ventricular  size is normal. Tricuspid regurgitation signal is inadequate for assessing  PA pressure.   3. The mitral  valve is normal in structure. Trivial mitral valve  regurgitation. No evidence of mitral stenosis.   4. The aortic valve is tricuspid. Aortic valve regurgitation is not  visualized. No aortic stenosis is present.   5. Aortic dilatation noted. 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   Comparison(s): EF 50-55%. Moderate LVH.    Risk Assessment/Calculations:          Physical Exam:   VS:  BP (!) 140/84 (BP Location: Left Arm, Patient Position: Sitting, Cuff Size: Large)   Pulse 80   Ht 6' (1.829 m)   Wt 270 lb 6.4 oz (122.7 kg)   BMI 36.67 kg/m    Wt Readings from Last 3 Encounters:  06/10/23 270 lb 6.4 oz (122.7 kg)  06/08/23 265 lb (120.2 kg)  02/25/23 274 lb 6.4 oz (124.5 kg)    GEN: Obese, in no acute distress NECK: No JVD; No carotid bruits CARDIAC:  RRR, no murmurs, rubs, gallops RESPIRATORY:  Clear to auscultation without rales, wheezing or rhonchi  ABDOMEN: Soft, non-tender, non-distended EXTREMITIES:  No edema; No deformity   ASSESSMENT AND PLAN: .    CAD status post DES to the mid LAD in April 2022.  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 Had chest pain with  tachycardia 06/08/23 and troponins mildly elevated felt to be demand ischemia.  ECG reviewed and stable. No angina with exercise. Continue aspirin , Jardiance  and Crestor .  Increase exercise 150 min weekly. 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 Currently on Coreg , Jardiance , Entresto , Aldactone , and as needed Demadex . compensated   PSVT with episode of tachycardia 140/m per watch on 06/08/23-NSR by the time he got to the ED. Felt to be dehydrated with Crt 1.73 and had a few beers the night before. He has an EKG setting on his watch but  hasn't set it up yet. He is on his wife's insurance and will be a 1 month gap he's trying to figure out. He doesn't want any testing until he gets this sorted out. If recurrent tachycardia he has prn Inderal and we can place a Zio.   Primary hypertension overall controlled   OSA on CPAP.  He reports compliance with treatment.  Obesity-exercise and weight loss discussed.  CKD stage 3 Crt up 1.73 felt to be dehydrated. Has f/u with PCP  CT scan with lung nodules followed by PCP.  HLD on crestor . LDL 97 06/03/23 on KPN  goal less than 70 on crestor . Would add zetia. Repeat FLP in 3 months           Dispo: f/u with Dr. Londa Rival in 6 months or sooner if needed.  Signed, Theotis Flake, PA-C

## 2023-06-10 NOTE — Telephone Encounter (Signed)
 After patient left office today, Lenze added orders for starting zetia 10 mg daily and FLP in 3 months.  Called but no answer and unable to leave a voicemail.

## 2023-06-10 NOTE — Telephone Encounter (Signed)
Patient informed and verbalized understanding of plan. Lab order faxed to [facility]. 

## 2023-06-10 NOTE — Patient Instructions (Addendum)
 Medication Instructions:  Your physician recommends that you continue on your current medications as directed. Please refer to the Current Medication list given to you today.  Labwork: none  Testing/Procedures: none  Follow-Up: Your physician recommends that you schedule a follow-up appointment in: 6 months with Dean Wilson  Any Other Special Instructions Will Be Listed Below (If Applicable). Exercise 150 minutes per week or 30 minutes 5 days per week Increase your water  intake Contact your family doctor about your kidney functioning Call our office if you have an increase in the frequency of your palpitations or if you have chest pain.  If you need a refill on your cardiac medications before your next appointment, please call your pharmacy.

## 2023-06-14 ENCOUNTER — Emergency Department (HOSPITAL_COMMUNITY)

## 2023-06-14 ENCOUNTER — Observation Stay (HOSPITAL_COMMUNITY)
Admission: EM | Admit: 2023-06-14 | Discharge: 2023-06-16 | Disposition: A | Discharge status: Home / Self Care | Attending: Internal Medicine | Admitting: Internal Medicine

## 2023-06-14 ENCOUNTER — Other Ambulatory Visit: Payer: Self-pay

## 2023-06-14 DIAGNOSIS — Z7902 Long term (current) use of antithrombotics/antiplatelets: Secondary | ICD-10-CM | POA: Diagnosis not present

## 2023-06-14 DIAGNOSIS — R079 Chest pain, unspecified: Secondary | ICD-10-CM | POA: Diagnosis present

## 2023-06-14 DIAGNOSIS — Z8673 Personal history of transient ischemic attack (TIA), and cerebral infarction without residual deficits: Secondary | ICD-10-CM | POA: Diagnosis not present

## 2023-06-14 DIAGNOSIS — Z79899 Other long term (current) drug therapy: Secondary | ICD-10-CM | POA: Insufficient documentation

## 2023-06-14 DIAGNOSIS — Z7984 Long term (current) use of oral hypoglycemic drugs: Secondary | ICD-10-CM | POA: Insufficient documentation

## 2023-06-14 DIAGNOSIS — N179 Acute kidney failure, unspecified: Secondary | ICD-10-CM | POA: Diagnosis not present

## 2023-06-14 DIAGNOSIS — I5042 Chronic combined systolic (congestive) and diastolic (congestive) heart failure: Secondary | ICD-10-CM | POA: Diagnosis not present

## 2023-06-14 DIAGNOSIS — Z87891 Personal history of nicotine dependence: Secondary | ICD-10-CM | POA: Insufficient documentation

## 2023-06-14 DIAGNOSIS — I959 Hypotension, unspecified: Secondary | ICD-10-CM | POA: Diagnosis not present

## 2023-06-14 DIAGNOSIS — N1831 Chronic kidney disease, stage 3a: Secondary | ICD-10-CM | POA: Insufficient documentation

## 2023-06-14 DIAGNOSIS — I130 Hypertensive heart and chronic kidney disease with heart failure and stage 1 through stage 4 chronic kidney disease, or unspecified chronic kidney disease: Secondary | ICD-10-CM | POA: Insufficient documentation

## 2023-06-14 DIAGNOSIS — R000 Tachycardia, unspecified: Principal | ICD-10-CM

## 2023-06-14 DIAGNOSIS — Z955 Presence of coronary angioplasty implant and graft: Secondary | ICD-10-CM | POA: Diagnosis not present

## 2023-06-14 DIAGNOSIS — Z7982 Long term (current) use of aspirin: Secondary | ICD-10-CM | POA: Insufficient documentation

## 2023-06-14 DIAGNOSIS — I2510 Atherosclerotic heart disease of native coronary artery without angina pectoris: Secondary | ICD-10-CM | POA: Insufficient documentation

## 2023-06-14 DIAGNOSIS — I214 Non-ST elevation (NSTEMI) myocardial infarction: Secondary | ICD-10-CM | POA: Diagnosis present

## 2023-06-14 DIAGNOSIS — G4733 Obstructive sleep apnea (adult) (pediatric): Secondary | ICD-10-CM | POA: Insufficient documentation

## 2023-06-14 DIAGNOSIS — R55 Syncope and collapse: Secondary | ICD-10-CM | POA: Insufficient documentation

## 2023-06-14 LAB — CBC
HCT: 49.4 % (ref 39.0–52.0)
Hemoglobin: 16 g/dL (ref 13.0–17.0)
MCH: 27.7 pg (ref 26.0–34.0)
MCHC: 32.4 g/dL (ref 30.0–36.0)
MCV: 85.5 fL (ref 80.0–100.0)
Platelets: 225 10*3/uL (ref 150–400)
RBC: 5.78 MIL/uL (ref 4.22–5.81)
RDW: 15.3 % (ref 11.5–15.5)
WBC: 8.9 10*3/uL (ref 4.0–10.5)
nRBC: 0 % (ref 0.0–0.2)

## 2023-06-14 LAB — URINALYSIS, W/ REFLEX TO CULTURE (INFECTION SUSPECTED)
Bacteria, UA: NONE SEEN
Bilirubin Urine: NEGATIVE
Glucose, UA: >=500 mg/dL — AB
Hgb urine dipstick: NEGATIVE
Ketones, ur: NEGATIVE mg/dL
Leukocytes,Ua: NEGATIVE
Nitrite: NEGATIVE
Protein, ur: 30 mg/dL — AB
Specific Gravity, Urine: 1.017 (ref 1.005–1.030)
pH: 5 (ref 5.0–8.0)

## 2023-06-14 LAB — COMPREHENSIVE METABOLIC PANEL WITH GFR
ALT: 16 U/L (ref 0–44)
AST: 19 U/L (ref 15–41)
Albumin: 3.7 g/dL (ref 3.5–5.0)
Alkaline Phosphatase: 43 U/L (ref 38–126)
Anion gap: 15 (ref 5–15)
BUN: 24 mg/dL — ABNORMAL HIGH (ref 8–23)
CO2: 18 mmol/L — ABNORMAL LOW (ref 22–32)
Calcium: 9.6 mg/dL (ref 8.9–10.3)
Chloride: 107 mmol/L (ref 98–111)
Creatinine, Ser: 2.2 mg/dL — ABNORMAL HIGH (ref 0.61–1.24)
GFR, Estimated: 33 mL/min — ABNORMAL LOW (ref >60)
Glucose, Bld: 117 mg/dL — ABNORMAL HIGH (ref 70–99)
Potassium: 3.5 mmol/L (ref 3.5–5.1)
Sodium: 140 mmol/L (ref 135–145)
Total Bilirubin: 1.1 mg/dL (ref 0.0–1.2)
Total Protein: 6.2 g/dL — ABNORMAL LOW (ref 6.5–8.1)

## 2023-06-14 LAB — TROPONIN I (HIGH SENSITIVITY): Troponin I (High Sensitivity): 19 ng/L — ABNORMAL HIGH (ref <18)

## 2023-06-14 LAB — I-STAT CG4 LACTIC ACID, ED: Lactic Acid, Venous: 1.9 mmol/L (ref 0.5–1.9)

## 2023-06-14 MED ORDER — SODIUM CHLORIDE 0.9 % IV BOLUS
1000.0000 mL | Freq: Once | INTRAVENOUS | Status: AC
  Administered 2023-06-14: 1000 mL via INTRAVENOUS

## 2023-06-14 NOTE — ED Triage Notes (Signed)
 Chest pain for the past hour with a fast heart rate sweating profuse  bp low  he feels like he is going tom pass out

## 2023-06-14 NOTE — ED Notes (Signed)
 Patient states he took one dose of propanol around 6pm. States he was told to take it when his heart rate increases. States he did not know it would effect his Bp.

## 2023-06-14 NOTE — ED Notes (Signed)
 The pt reports that he is feeling a little better now  his heart rate slowed down his bp is starting to  increase and his sweating has dried up

## 2023-06-14 NOTE — ED Provider Notes (Signed)
 [place] EMERGENCY DEPARTMENT AT [facility] Provider Note   CSN: [id number] Arrival date & time: 06/14/23  2054     History  Chief Complaint  Patient presents with   Chest Pain    Dean Wilson is a 63 y.o. male.  HPI Patient reports that he felt his heart start to race and he became very sweaty and lightheaded.  He was having chest pain.  He did have his wife drive him to the hospital and reports he almost passed out twice.  Patient reports that his last visit to the hospital he was given propranolol  to take for racing heart symptoms.  He reports he tried it but it just was not helping.  He continued to have racing heart and felt extremely diaphoretic and weak.  He continued to have chest pressure.    Home Medications Prior to Admission medications   Medication Sig Start Date End Date Taking? Authorizing Provider  amLODipine  (NORVASC ) 5 MG tablet Take 1 tablet (5 mg total) by mouth daily. 04/18/23  Yes Gerard Knight, MD  ASPIRIN  LOW DOSE 81 MG tablet Take 1 tablet (81 mg total) by mouth daily. 03/20/23  Yes Gerard Knight, MD  carvedilol  (COREG ) 25 MG tablet Take 1 tablet (25 mg total) by mouth 2 (two) times daily. 03/20/23  Yes Gerard Knight, MD  clopidogrel  (PLAVIX ) 75 MG tablet Take 75 mg by mouth daily. 04/29/20  Yes [provider]  empagliflozin  (JARDIANCE ) 10 MG TABS tablet Take 1 tablet (10 mg total) by mouth daily before breakfast. 03/20/23  Yes Gerard Knight, MD  ezetimibe  (ZETIA ) 10 MG tablet Take 1 tablet (10 mg total) by mouth daily. 06/10/23 09/08/23 Yes Flo Hummingbird, PA-C  fluticasone (FLONASE) 50 MCG/ACT nasal spray Place 1 spray into both nostrils daily as needed for allergies. 05/20/23  Yes [provider]  hydrALAZINE  (APRESOLINE ) 25 MG tablet TAKE 1 TABLET BY MOUTH 3 TIMES  DAILY 06/10/23  Yes Gerard Knight, MD  Multiple Vitamin (MULTIVITAMIN) tablet Take 1 tablet by mouth daily.   Yes [provider]   pantoprazole  (PROTONIX ) 40 MG tablet Take 1 tablet (40 mg total) by mouth daily. 01/26/11 06/09/96 Yes Rehman, Mathews Solomons, MD  propranolol  (INDERAL ) 10 MG tablet Take 1 tablet (10 mg total) by mouth as needed (rapid heart rate). 06/08/23  Yes Kommor, Madison, MD  rosuvastatin  (CRESTOR ) 40 MG tablet Take 1 tablet (40 mg total) by mouth daily. 03/20/23  Yes Gerard Knight, MD  sacubitril -valsartan  (ENTRESTO ) 97-103 MG Take 1 tablet by mouth 2 (two) times daily. 03/20/23  Yes Gerard Knight, MD  spironolactone  (ALDACTONE ) 25 MG tablet Take 1 tablet (25 mg total) by mouth daily. 03/20/23  Yes Gerard Knight, MD  torsemide  (DEMADEX ) 20 MG tablet Take 1 tablet (20 mg total) by mouth daily as needed. Patient taking differently: Take 20 mg by mouth daily as needed (for swelling and fluid). 03/20/23  Yes Gerard Knight, MD  meclizine  (ANTIVERT ) 25 MG tablet Take 1 tablet (25 mg total) by mouth 3 (three) times daily as needed for up to 15 doses for dizziness. Patient not taking: Reported on 06/14/2023 10/07/20   Billie Budge, MD      Allergies    Banana and Sunflower oil    Review of Systems   Review of Systems  Physical Exam Updated Vital Signs BP 101/68   Pulse 76   Temp (!) 97 F (36.1 C)   Resp  17   Ht 6' (1.829 m)   Wt 122.7 kg   SpO2 99%   BMI 36.69 kg/m  Physical Exam Constitutional:      Comments: Alert clear mental status.  No respiratory distress.  HENT:     Head: Normocephalic and atraumatic.     Mouth/Throat:     Pharynx: Oropharynx is clear.  Cardiovascular:     Rate and Rhythm: Normal rate and regular rhythm.  Pulmonary:     Effort: Pulmonary effort is normal.     Breath sounds: Normal breath sounds.  Abdominal:     General: There is no distension.     Palpations: Abdomen is soft.     Tenderness: There is no abdominal tenderness.  Musculoskeletal:        General: Normal range of motion.  Skin:    General: Skin is warm and dry.  Neurological:     General: No  focal deficit present.     Mental Status: He is oriented to person, place, and time.     Motor: No weakness.     Coordination: Coordination normal.  Psychiatric:        Mood and Affect: Mood normal.     ED Results / Procedures / Treatments   Labs (all labs ordered are listed, but only abnormal results are displayed) Labs Reviewed  COMPREHENSIVE METABOLIC PANEL WITH GFR - Abnormal; Notable for the following components:      Result Value   CO2 18 (*)    Glucose, Bld 117 (*)    BUN 24 (*)    Creatinine, Ser 2.20 (*)    Total Protein 6.2 (*)    GFR, Estimated 33 (*)    All other components within normal limits  URINALYSIS, W/ REFLEX TO CULTURE (INFECTION SUSPECTED) - Abnormal; Notable for the following components:   Glucose, UA >=500 (*)    Protein, ur 30 (*)    All other components within normal limits  TROPONIN I (HIGH SENSITIVITY) - Abnormal; Notable for the following components:   Troponin I (High Sensitivity) 19 (*)    All other components within normal limits  CBC  I-STAT CG4 LACTIC ACID, ED  I-STAT CG4 LACTIC ACID, ED  TROPONIN I (HIGH SENSITIVITY)    EKG EKG Interpretation Date/Time:  Friday Jun 14 2023 21:02:35 EDT Ventricular Rate:  72 PR Interval:  182 QRS Duration:  92 QT Interval:  418 QTC Calculation: 457 R Axis:   70  Text Interpretation: Sinus rhythm with Premature atrial complexes Cannot rule out Anterior infarct , age undetermined Abnormal ECG When compared with ECG of 08-Jun-2023 07:54, PREVIOUS ECG IS PRESENT no sig change from previous Confirmed by Wynetta Heckle [phone]) on 06/14/2023 9:24:34 PM  Radiology DG Chest 1 View Result Date: 06/14/2023 CLINICAL DATA:  Care refused by patient. EXAM: CHEST  1 VIEW COMPARISON:  Jun 08, 2023 FINDINGS: The heart size and mediastinal contours are within normal limits. Mild, stable left basilar linear scarring and/or atelectasis is seen. No acute infiltrate, pleural effusion or pneumothorax is identified. The  visualized skeletal structures are unremarkable. IMPRESSION: Mild, stable left basilar linear scarring and/or atelectasis. Electronically Signed   By: Virgle Grime M.D.   On: 06/14/2023 23:23    Procedures Procedures    Medications Ordered in ED Medications  sodium chloride  0.9 % bolus 1,000 mL (0 mLs Intravenous Stopped 06/14/23 2249)    ED Course/ Medical Decision Making/ A&P  Medical Decision Making Amount and/or Complexity of Data Reviewed Labs: ordered. Radiology: ordered.  Risk Decision regarding hospitalization.  Presents as outlined.  On arrival he is hypotensive with systolic blood pressures 80s to 70s.  Heart rate is controlled in the 80s.  His sinus rhythm narrow complex.  Will initiate volume resuscitation.  Will proceed with workup for ACS.  EKG reviewed by myself no acute STEMI.  Consistent with prior tracing.  First troponin 19.  Potassium 3.5.  GFR 33.  I-STAT lactic 1.9 white count 8.9 hemoglobin 16  Patient has findings consistent with AKI.  He did present with hypotension.  This is likely combination of volume depletion with acute propranolol  dose.  Pressures slowly responding to volume resuscitation.  Pressures remain soft.  This time we will plan for admission for rule out of ACS with high risk factors and AKI.  Consult: [facility] hospitalist Dr. Michal Agar for admission        Final Clinical Impression(s) / ED Diagnoses Final diagnoses:  AKI (acute kidney injury) (HCC)  Hypotension, unspecified hypotension type  Near syncope  Tachycardia    Rx / DC Orders ED Discharge Orders     None         Wynetta Heckle, MD 06/14/23 2359

## 2023-06-15 ENCOUNTER — Encounter (HOSPITAL_COMMUNITY): Payer: Self-pay | Admitting: Internal Medicine

## 2023-06-15 DIAGNOSIS — I959 Hypotension, unspecified: Secondary | ICD-10-CM | POA: Diagnosis present

## 2023-06-15 DIAGNOSIS — R000 Tachycardia, unspecified: Principal | ICD-10-CM

## 2023-06-15 DIAGNOSIS — N179 Acute kidney failure, unspecified: Secondary | ICD-10-CM | POA: Diagnosis not present

## 2023-06-15 DIAGNOSIS — I214 Non-ST elevation (NSTEMI) myocardial infarction: Secondary | ICD-10-CM | POA: Diagnosis not present

## 2023-06-15 DIAGNOSIS — I9589 Other hypotension: Secondary | ICD-10-CM

## 2023-06-15 LAB — PHOSPHORUS: Phosphorus: 3.7 mg/dL (ref 2.5–4.6)

## 2023-06-15 LAB — MAGNESIUM: Magnesium: 1.9 mg/dL (ref 1.7–2.4)

## 2023-06-15 LAB — CBC
HCT: 45.1 % (ref 39.0–52.0)
Hemoglobin: 14.4 g/dL (ref 13.0–17.0)
MCH: 27.7 pg (ref 26.0–34.0)
MCHC: 31.9 g/dL (ref 30.0–36.0)
MCV: 86.7 fL (ref 80.0–100.0)
Platelets: 147 10*3/uL — ABNORMAL LOW (ref 150–400)
RBC: 5.2 MIL/uL (ref 4.22–5.81)
RDW: 15.3 % (ref 11.5–15.5)
WBC: 8 10*3/uL (ref 4.0–10.5)
nRBC: 0 % (ref 0.0–0.2)

## 2023-06-15 LAB — HIV ANTIBODY (ROUTINE TESTING W REFLEX): HIV Screen 4th Generation wRfx: NONREACTIVE

## 2023-06-15 LAB — BASIC METABOLIC PANEL WITH GFR
Anion gap: 11 (ref 5–15)
BUN: 25 mg/dL — ABNORMAL HIGH (ref 8–23)
CO2: 21 mmol/L — ABNORMAL LOW (ref 22–32)
Calcium: 8.8 mg/dL — ABNORMAL LOW (ref 8.9–10.3)
Chloride: 110 mmol/L (ref 98–111)
Creatinine, Ser: 1.79 mg/dL — ABNORMAL HIGH (ref 0.61–1.24)
GFR, Estimated: 42 mL/min — ABNORMAL LOW (ref >60)
Glucose, Bld: 116 mg/dL — ABNORMAL HIGH (ref 70–99)
Potassium: 4.2 mmol/L (ref 3.5–5.1)
Sodium: 142 mmol/L (ref 135–145)

## 2023-06-15 LAB — TROPONIN I (HIGH SENSITIVITY): Troponin I (High Sensitivity): 361 ng/L (ref <18)

## 2023-06-15 MED ORDER — ONDANSETRON HCL 4 MG/2ML IJ SOLN: 4.0000 mg | Freq: Four times a day (QID) | INTRAMUSCULAR | Status: DC | PRN

## 2023-06-15 MED ORDER — ROSUVASTATIN CALCIUM 20 MG PO TABS
40.0000 mg | ORAL_TABLET | Freq: Every day | ORAL | Status: DC
  Administered 2023-06-15 – 2023-06-16 (×2): 40 mg via ORAL
  Filled 2023-06-15 (×2): qty 2

## 2023-06-15 MED ORDER — ASPIRIN 81 MG PO TBEC
81.0000 mg | DELAYED_RELEASE_TABLET | Freq: Every day | ORAL | Status: DC
  Administered 2023-06-16: 81 mg via ORAL
  Filled 2023-06-15: qty 1

## 2023-06-15 MED ORDER — LACTATED RINGERS IV BOLUS
500.0000 mL | Freq: Once | INTRAVENOUS | Status: AC
  Administered 2023-06-15: 500 mL via INTRAVENOUS

## 2023-06-15 MED ORDER — SODIUM CHLORIDE 0.9% FLUSH
3.0000 mL | Freq: Two times a day (BID) | INTRAVENOUS | Status: DC
  Administered 2023-06-15 – 2023-06-16 (×3): 3 mL via INTRAVENOUS

## 2023-06-15 MED ORDER — ASPIRIN 81 MG PO CHEW
324.0000 mg | CHEWABLE_TABLET | Freq: Once | ORAL | Status: AC
  Administered 2023-06-15: 324 mg via ORAL
  Filled 2023-06-15: qty 4

## 2023-06-15 MED ORDER — PANTOPRAZOLE SODIUM 40 MG PO TBEC
40.0000 mg | DELAYED_RELEASE_TABLET | Freq: Every day | ORAL | Status: DC
  Administered 2023-06-15 – 2023-06-16 (×2): 40 mg via ORAL
  Filled 2023-06-15 (×2): qty 1

## 2023-06-15 MED ORDER — EZETIMIBE 10 MG PO TABS
10.0000 mg | ORAL_TABLET | Freq: Every day | ORAL | Status: DC
  Administered 2023-06-15 – 2023-06-16 (×2): 10 mg via ORAL
  Filled 2023-06-15 (×2): qty 1

## 2023-06-15 MED ORDER — ENOXAPARIN SODIUM 60 MG/0.6ML IJ SOSY
60.0000 mg | PREFILLED_SYRINGE | INTRAMUSCULAR | Status: DC
  Filled 2023-06-15 (×2): qty 0.6

## 2023-06-15 MED ORDER — POLYETHYLENE GLYCOL 3350 17 G PO PACK: 17.0000 g | PACK | Freq: Every day | ORAL | Status: DC | PRN

## 2023-06-15 MED ORDER — CLOPIDOGREL BISULFATE 75 MG PO TABS
75.0000 mg | ORAL_TABLET | Freq: Every day | ORAL | Status: DC
  Administered 2023-06-15 – 2023-06-16 (×2): 75 mg via ORAL
  Filled 2023-06-15 (×2): qty 1

## 2023-06-15 MED ORDER — ACETAMINOPHEN 500 MG PO TABS: 1000.0000 mg | ORAL_TABLET | Freq: Four times a day (QID) | ORAL | Status: DC | PRN

## 2023-06-15 MED ORDER — MELATONIN 3 MG PO TABS
6.0000 mg | ORAL_TABLET | Freq: Every evening | ORAL | Status: DC | PRN
Start: 2023-06-15 — End: 2023-06-16

## 2023-06-15 NOTE — ED Notes (Signed)
 PT moved to yellow 38. PT is A&O, expressing interest in food.  Verbal order obtained for Heart healthy diet.  Order placed and pt provided crackers and drink per request.

## 2023-06-15 NOTE — Consult Note (Signed)
 Cardiology Consultation:   Patient ID: Dean Wilson MRN: [id number]; DOB: 02/06/1961  Admit date: 06/14/2023 Date of Consult: 06/15/2023  Primary Care Provider: Orlena Bitters, MD Primary Cardiologist: Teddie Favre, MD  Primary Electrophysiologist:  None    Patient Profile:   Dean Wilson is a 63 y.o. male with a hx of CAD, HF with recovered EF, CKD, HTN, OSA who is being seen today for the evaluation of SVT and NSTEMI at the request of hospital medicine.  History of Present Illness:   Dean Wilson is a 63 y.o. male who has a history of coronary artery disease, heart failure with recovered EF, CKD, hypertension, OSA who presents today after an episode of tachycardia, chest discomfort and shortness of breath.  Notably he was in the emergency department last week with episode of SVT for which he was discharged with as needed nonselective beta-blocker.  Had been doing well up until yesterday when he noticed that he had the rapid heart rate again.  Feels that it may have been stimulated by chocolate. He checked his watch and noted that his heart rate was in the 140s and this persisted for over an hour.  He took the as needed propranolol  which did not improve his heart rate.  In that time he was having chest pain and shortness of breath which prompted him to come to the emergency department. Also BP was low and he was having some dizziness.   In the emergency department, his symptoms have resolved.  Laboratory evaluation was most notable for troponin that went from 19 up to 361.     Past Medical History:  Diagnosis Date   CAD (coronary artery disease)    DES to mid LAD April 2022   Essential hypertension    GERD (gastroesophageal reflux disease)    History of cardiomyopathy    OSA on CPAP    PSVT (paroxysmal supraventricular tachycardia) (HCC)    Stroke [facility])      Past Surgical History:  Procedure Laterality Date   CORONARY STENT INTERVENTION N/A 05/20/2020    Procedure: CORONARY STENT INTERVENTION;  Surgeon: Odie Benne, MD;  Location: [facility] INVASIVE CV LAB;  Service: Cardiovascular;  Laterality: N/A;   LEFT HEART CATH AND CORONARY ANGIOGRAPHY N/A 01/31/2022   Procedure: LEFT HEART CATH AND CORONARY ANGIOGRAPHY;  Surgeon: Arnoldo Lapping, MD;  Location: [facility] INVASIVE CV LAB;  Service: Cardiovascular;  Laterality: N/A;   RIGHT/LEFT HEART CATH AND CORONARY ANGIOGRAPHY N/A 05/20/2020   Procedure: RIGHT/LEFT HEART CATH AND CORONARY ANGIOGRAPHY;  Surgeon: Mardell Shade, MD;  Location: [facility] INVASIVE CV LAB;  Service: Cardiovascular;  Laterality: N/A;   UMBILICAL HERNIA REPAIR       Home Medications:  Prior to Admission medications   Medication Sig Start Date End Date Taking? Authorizing Provider  amLODipine  (NORVASC ) 5 MG tablet Take 1 tablet (5 mg total) by mouth daily. 04/18/23  Yes Gerard Knight, MD  ASPIRIN  LOW DOSE 81 MG tablet Take 1 tablet (81 mg total) by mouth daily. 03/20/23  Yes Gerard Knight, MD  carvedilol  (COREG ) 25 MG tablet Take 1 tablet (25 mg total) by mouth 2 (two) times daily. 03/20/23  Yes Gerard Knight, MD  clopidogrel  (PLAVIX ) 75 MG tablet Take 75 mg by mouth daily. 04/29/20  Yes [provider]  empagliflozin  (JARDIANCE ) 10 MG TABS tablet Take 1 tablet (10 mg total) by mouth daily before breakfast. 03/20/23  Yes Gerard Knight, MD  ezetimibe  (ZETIA ) 10 MG tablet Take  1 tablet (10 mg total) by mouth daily. 06/10/23 09/08/23 Yes Flo Hummingbird, PA-C  fluticasone (FLONASE) 50 MCG/ACT nasal spray Place 1 spray into both nostrils daily as needed for allergies. 05/20/23  Yes [provider]  hydrALAZINE  (APRESOLINE ) 25 MG tablet TAKE 1 TABLET BY MOUTH 3 TIMES  DAILY 06/10/23  Yes Gerard Knight, MD  Multiple Vitamin (MULTIVITAMIN) tablet Take 1 tablet by mouth daily.   Yes [provider]  pantoprazole  (PROTONIX ) 40 MG tablet Take 1 tablet (40 mg total) by mouth daily. 01/26/11 06/09/96 Yes  Rehman, Mathews Solomons, MD  propranolol  (INDERAL ) 10 MG tablet Take 1 tablet (10 mg total) by mouth as needed (rapid heart rate). 06/08/23  Yes Kommor, Madison, MD  rosuvastatin  (CRESTOR ) 40 MG tablet Take 1 tablet (40 mg total) by mouth daily. 03/20/23  Yes Gerard Knight, MD  sacubitril -valsartan  (ENTRESTO ) 97-103 MG Take 1 tablet by mouth 2 (two) times daily. 03/20/23  Yes Gerard Knight, MD  spironolactone  (ALDACTONE ) 25 MG tablet Take 1 tablet (25 mg total) by mouth daily. 03/20/23  Yes Gerard Knight, MD  torsemide  (DEMADEX ) 20 MG tablet Take 1 tablet (20 mg total) by mouth daily as needed. Patient taking differently: Take 20 mg by mouth daily as needed (for swelling and fluid). 03/20/23  Yes Gerard Knight, MD  meclizine  (ANTIVERT ) 25 MG tablet Take 1 tablet (25 mg total) by mouth 3 (three) times daily as needed for up to 15 doses for dizziness. Patient not taking: Reported on 06/14/2023 10/07/20   Billie Budge, MD    Inpatient Medications: Scheduled Meds:  [START ON 06/16/2023] aspirin  EC  81 mg Oral Daily   clopidogrel   75 mg Oral Daily   enoxaparin  (LOVENOX ) injection  60 mg Subcutaneous Q24H   ezetimibe   10 mg Oral Daily   pantoprazole   40 mg Oral Daily   rosuvastatin   40 mg Oral Daily   sodium chloride  flush  3 mL Intravenous Q12H   Continuous Infusions:  PRN Meds: acetaminophen , melatonin, ondansetron  (ZOFRAN ) IV, polyethylene glycol  Allergies:    Allergies  Allergen Reactions   Banana Itching   Sunflower Oil Itching    Seeds not oil    Social History:   Social History   Socioeconomic History   Marital status: Married    Spouse name: Theadora Fines   Number of children: 2   Years of education: Not on file   Highest education level: High school graduate  Occupational History   Occupation: Tax inspector    Employer: [employer]  Tobacco Use   Smoking status: Former    Current packs/day: 0.00    Average packs/day: 2.0 packs/day for 25.0 years (50.0 ttl  pk-yrs)    Types: Cigarettes    Start date: 01/26/1983    Quit date: 01/26/2008    Years since quitting: 15.3   Smokeless tobacco: Never  Vaping Use   Vaping status: Never Used  Substance and Sexual Activity   Alcohol use: Never   Drug use: No   Sexual activity: Yes    Birth control/protection: None  Other Topics Concern   Not on file  Social History Narrative   Not on file   Social Drivers of Health   Financial Resource Strain: Low Risk  (05/06/2020)   Overall Financial Resource Strain (CARDIA)    Difficulty of Paying Living Expenses: Not very hard  Food Insecurity: No Food Insecurity (01/31/2022)   Hunger Vital Sign    Worried About  Running Out of Food in the Last Year: Never true    Ran Out of Food in the Last Year: Never true  Transportation Needs: No Transportation Needs (01/31/2022)   PRAPARE - Administrator, Civil Service (Medical): No    Lack of Transportation (Non-Medical): No  Physical Activity: Not on file  Stress: Not on file  Social Connections: Not on file  Intimate Partner Violence: Not At Risk (01/31/2022)   Humiliation, Afraid, Rape, and Kick questionnaire    Fear of Current or Ex-Partner: No    Emotionally Abused: No    Physically Abused: No    Sexually Abused: No    Family History:    Family History  Problem Relation Age of Onset   Arrhythmia Mother    Heart attack Father    Arrhythmia Sister        a-fib   Arrhythmia Brother    Anesthesia problems Neg Hx    Hypotension Neg Hx    Malignant hyperthermia Neg Hx    Pseudochol deficiency Neg Hx      Review of Systems: All other review of systems are negative unless otherwise noted in the HPI as above.   Physical Exam/Data:   Vitals:   06/15/23 0015 06/15/23 0030 06/15/23 0100 06/15/23 0400  BP: (!) 83/65 (!) 104/57 (!) 124/57 101/64  Pulse: 68 63 66 (!) 55  Resp: (!) 27 14 12 12   Temp:   97.6 F (36.4 C) 97.8 F (36.6 C)  TempSrc:   Oral Oral  SpO2: 100% 97% 99% 94%   Weight:      Height:        Intake/Output Summary (Last 24 hours) at 06/15/2023 0455 Last data filed at 06/15/2023 0148 Gross per 24 hour  Intake 500 ml  Output --  Net 500 ml   Filed Weights   06/14/23 2100  Weight: 122.7 kg   Body mass index is 36.69 kg/m.  General appearance: alert, cooperative, and appears stated age Neck: no JVD, supple, symmetrical, trachea midline, and thyroid  not enlarged, symmetric, no tenderness/mass/nodules Lungs: clear to auscultation bilaterally Heart: regular rate and rhythm, S1, S2 normal, no murmur, click, rub or gallop Abdomen: soft, non-tender; bowel sounds normal; no masses,  no organomegaly Pulses: 2+ and symmetric Neurologic: Grossly normal  EKG:  The EKG was personally reviewed and demonstrates: Sinus rhythm with PACs Telemetry:  Telemetry was personally reviewed and demonstrates: Sinus rhythm  Relevant CV Studies: Echo 03/21/2023: 1. Left ventricular ejection fraction, by estimation, is 55 to 60%. The  left ventricle has normal function. The left ventricle has no regional  wall motion abnormalities. There is mild left ventricular hypertrophy.  Left ventricular diastolic parameters  are consistent with Grade I diastolic dysfunction (impaired relaxation).  Elevated left atrial pressure.   2. Right ventricular systolic function is normal. The right ventricular  size is normal. Tricuspid regurgitation signal is inadequate for assessing  PA pressure.   3. The mitral valve is normal in structure. Trivial mitral valve  regurgitation. No evidence of mitral stenosis.   4. The aortic valve is tricuspid. Aortic valve regurgitation is not  visualized. No aortic stenosis is present.   5. Aortic dilatation noted. 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   Comparison(s): EF 50-55%. Moderate LVH.    Laboratory Data:  Chemistry Recent Labs  Lab 06/08/23 0751 06/14/23 2132 06/15/23 0158  NA 138 140 142  K 4.4 3.5  4.2  CL 106 107  110  CO2 17* 18* 21*  GLUCOSE 109* 117* 116*  BUN 41* 24* 25*  CREATININE 1.73* 2.20* 1.79*  CALCIUM  9.0 9.6 8.8*  GFRNONAA 44* 33* 42*  ANIONGAP 15 15 11     Recent Labs  Lab 06/14/23 2132  PROT 6.2*  ALBUMIN 3.7  AST 19  ALT 16  ALKPHOS 43  BILITOT 1.1   Hematology Recent Labs  Lab 06/08/23 0751 06/14/23 2107 06/15/23 0158  WBC 9.0 8.9 8.0  RBC 5.95* 5.78 5.20  HGB 16.5 16.0 14.4  HCT 51.2 49.4 45.1  MCV 86.1 85.5 86.7  MCH 27.7 27.7 27.7  MCHC 32.2 32.4 31.9  RDW 15.1 15.3 15.3  PLT 173 225 147*   Cardiac EnzymesNo results for input(s): "TROPONINI" in the last 168 hours. No results for input(s): "TROPIPOC" in the last 168 hours.  BNPNo results for input(s): "BNP", "PROBNP" in the last 168 hours.  DDimer No results for input(s): "DDIMER" in the last 168 hours.  Radiology/Studies:  DG Chest 1 View Result Date: 06/14/2023 CLINICAL DATA:  Care refused by patient. EXAM: CHEST  1 VIEW COMPARISON:  Jun 08, 2023 FINDINGS: The heart size and mediastinal contours are within normal limits. Mild, stable left basilar linear scarring and/or atelectasis is seen. No acute infiltrate, pleural effusion or pneumothorax is identified. The visualized skeletal structures are unremarkable. IMPRESSION: Mild, stable left basilar linear scarring and/or atelectasis. Electronically Signed   By: Virgle Grime M.D.   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  Notes that his heart rates were immediately elevated to the 130s without much variation.  Took propranolol  and felt worse.  Notes that he had been avoiding triggers that cause him to have palpitations such as caffeine and chocolate.  He ate some chocolate yesterday evening and shortly after had acute onset of palpitations.  His symptoms have resolved so feel that the NSTEMI was demand ischemia in the setting of  persistent tachycardia.  Recommendations: - Agree with observation admission - Will need to get established with EP - Have not captured the rhythm on telemetry, should consider outpatient heart monitor with the close EP follow-up or consider EP study - no need for anticoagulation      For questions or updates, please contact  [facility] Please consult [email or web address] for contact info under     Signed, Bert Britain, MD  06/15/2023 4:55 AM

## 2023-06-15 NOTE — Plan of Care (Signed)
  Problem: Health Behavior/Discharge Planning: Goal: Ability to manage health-related needs will improve Outcome: Progressing   Problem: Clinical Measurements: Goal: Ability to maintain clinical measurements within normal limits will improve Outcome: Progressing Goal: Cardiovascular complication will be avoided Outcome: Progressing   

## 2023-06-15 NOTE — Plan of Care (Signed)
 Plan of Care note: Patient has presented with concerns of SVT induced chest pain and NSTEMI in the setting of paroxysmal SVT and underlying CAD.  Found to have hypotension and associated AKI.  Reviewing plan from Drs. Segars and Narcisse (cardiology) appears to be that if he was electrically quiescent, AKI resolved and BP improved, he would be discharged within two midnights with EP follow up.  BP has improved with IVF  Heart rates are low.  Plan to monitor vital signs. - if SVT returns we will start AAD therapy (given CAD and HF likely IV amiodarone) for suppression.  -tomorrow we will continue to follow, optimize GDMT and discussed with Dr. Mdala-Gausi about transition to inpatient or discharge as appropriate.  Gloriann Larger, MD FASE [facility] Cardiologist [facility]  [address] [place], [place] [address] [phone]  12:21 PM

## 2023-06-15 NOTE — Plan of Care (Signed)
 PLAN OF CARE NOTE    63 y.o. male with hx of CAD with PCI LAD, HF improved EF, paroxysmal SVT, CKD3a, hypertension, OSA, recent ED visit on 5/3 after presumed episode of SVT at home discharged on propranolol  prn, who returns after another episode of tachycardia.    Cardiology was consulted.  Patient felt to have SVT induced chest pain and NSTEMI in the setting of paroxysmal SVT and underlying CAD. Found to have hypotension and associated AKI.   AKI resolved (creatinine trended down from 2.2 on admission to 1.79 this morning).  -Patient will be monitored overnight on telemetry. - If SVT recurs, patient will need to be started on antiarrhythmics for suppression. - Cardiology will see patient in a.m. -Will continue to hold GDMT and antihypertensives (home Coreg , Jardiance , hydralazine , Entresto , Aldactone ) for now given AKI, hypotension.  MDALA-GAUSI, Jilda Most, MD  06/15/23 5:43 PM

## 2023-06-15 NOTE — H&P (Signed)
 History and Physical    Dean Wilson WGN:[id number] DOB: 1960-06-15 DOA: 06/14/2023  PCP: Orlena Bitters, MD   Patient coming from: Home   Chief Complaint:  Chief Complaint  Patient presents with   Chest Pain    HPI:  Dean Wilson is a 63 y.o. male with hx of CAD with PCI LAD, HF improved EF, paroxysmal SVT, CKD3a, hypertension, OSA, recent ED visit on 5/3 after presumed episode of SVT at home discharged on propranolol  prn, who returns after another episode of tachycardia.  Reports he had been in normal state of health when he suddenly had an irregular heartbeat feeling like a skipped beat.  Then noted he had a rapid heart rate following.  Checked his smart watch with heart rate into the 140s.  This went on for about an hour.  He took propranolol  prn but did not help with his heart rate.  He told EDP that he became diaphoretic lightheaded then was having chest pain at the time, presyncopal en route to the ED.  On my interview he denies any other associated symptoms other than rapid heart rate.  No chest pain at time of interview.   Review of Systems:  ROS complete and negative except as marked above   Allergies  Allergen Reactions   Banana Itching   Sunflower Oil Itching    Seeds not oil    Prior to Admission medications   Medication Sig Start Date End Date Taking? Authorizing Provider  amLODipine  (NORVASC ) 5 MG tablet Take 1 tablet (5 mg total) by mouth daily. 04/18/23  Yes Gerard Knight, MD  ASPIRIN  LOW DOSE 81 MG tablet Take 1 tablet (81 mg total) by mouth daily. 03/20/23  Yes Gerard Knight, MD  carvedilol  (COREG ) 25 MG tablet Take 1 tablet (25 mg total) by mouth 2 (two) times daily. 03/20/23  Yes Gerard Knight, MD  clopidogrel  (PLAVIX ) 75 MG tablet Take 75 mg by mouth daily. 04/29/20  Yes [provider]  empagliflozin  (JARDIANCE ) 10 MG TABS tablet Take 1 tablet (10 mg total) by mouth daily before breakfast. 03/20/23  Yes Gerard Knight, MD   ezetimibe  (ZETIA ) 10 MG tablet Take 1 tablet (10 mg total) by mouth daily. 06/10/23 09/08/23 Yes Flo Hummingbird, PA-C  fluticasone (FLONASE) 50 MCG/ACT nasal spray Place 1 spray into both nostrils daily as needed for allergies. 05/20/23  Yes [provider]  hydrALAZINE  (APRESOLINE ) 25 MG tablet TAKE 1 TABLET BY MOUTH 3 TIMES  DAILY 06/10/23  Yes Gerard Knight, MD  Multiple Vitamin (MULTIVITAMIN) tablet Take 1 tablet by mouth daily.   Yes [provider]  pantoprazole  (PROTONIX ) 40 MG tablet Take 1 tablet (40 mg total) by mouth daily. 01/26/11 06/09/96 Yes Rehman, Mathews Solomons, MD  propranolol  (INDERAL ) 10 MG tablet Take 1 tablet (10 mg total) by mouth as needed (rapid heart rate). 06/08/23  Yes Kommor, [place], MD  rosuvastatin  (CRESTOR ) 40 MG tablet Take 1 tablet (40 mg total) by mouth daily. 03/20/23  Yes Gerard Knight, MD  sacubitril -valsartan  (ENTRESTO ) 97-103 MG Take 1 tablet by mouth 2 (two) times daily. 03/20/23  Yes Gerard Knight, MD  spironolactone  (ALDACTONE ) 25 MG tablet Take 1 tablet (25 mg total) by mouth daily. 03/20/23  Yes Gerard Knight, MD  torsemide  (DEMADEX ) 20 MG tablet Take 1 tablet (20 mg total) by mouth daily as needed. Patient taking differently: Take 20 mg by mouth daily as needed (for swelling and fluid). 03/20/23  Yes Gerard Knight, MD  meclizine  (ANTIVERT ) 25 MG tablet Take 1 tablet (25 mg total) by mouth 3 (three) times daily as needed for up to 15 doses for dizziness. Patient not taking: Reported on 06/14/2023 10/07/20   Billie Budge, MD    Past Medical History:  Diagnosis Date   CAD (coronary artery disease)    DES to mid LAD April 2022   Essential hypertension    GERD (gastroesophageal reflux disease)    History of cardiomyopathy    OSA on CPAP    PSVT (paroxysmal supraventricular tachycardia) (HCC)    Stroke [facility])     Past Surgical History:  Procedure Laterality Date   CORONARY STENT INTERVENTION N/A 05/20/2020   Procedure:  CORONARY STENT INTERVENTION;  Surgeon: Odie Benne, MD;  Location: [facility] INVASIVE CV LAB;  Service: Cardiovascular;  Laterality: N/A;   LEFT HEART CATH AND CORONARY ANGIOGRAPHY N/A 01/31/2022   Procedure: LEFT HEART CATH AND CORONARY ANGIOGRAPHY;  Surgeon: Arnoldo Lapping, MD;  [facility] INVASIVE CV LAB;  Service: Cardiovascular;  Laterality: N/A;   RIGHT/LEFT HEART CATH AND CORONARY ANGIOGRAPHY N/A 05/20/2020   Procedure: RIGHT/LEFT HEART CATH AND CORONARY ANGIOGRAPHY;  Surgeon: Mardell Shade, MD;  Location: [facility] INVASIVE CV LAB;  Service: Cardiovascular;  Laterality: N/A;   UMBILICAL HERNIA REPAIR       reports that he quit smoking about 15 years ago. His smoking use included cigarettes. He started smoking about 40 years ago. He has a 50 pack-year smoking history. He has never used smokeless tobacco. He reports that he does not drink alcohol and does not use drugs.  Family History  Problem Relation Age of Onset   Arrhythmia Mother    Heart attack Father    Arrhythmia Sister        a-fib   Arrhythmia Brother    Anesthesia problems Neg Hx    Hypotension Neg Hx    Malignant hyperthermia Neg Hx    Pseudochol deficiency Neg Hx      Physical Exam: Vitals:   06/15/23 0000 06/15/23 0015 06/15/23 0030 06/15/23 0100  BP: 116/72 (!) 83/65 (!) 104/57 (!) 124/57  Pulse: 70 68 63 66  Resp: (!) 23 (!) 27 14 12   Temp:    97.6 F (36.4 C)  TempSrc:    Oral  SpO2: 99% 100% 97% 99%  Weight:      Height:        Gen: Awake, alert, NAD   CV: Regular, normal S1, S2, no murmurs  Resp: Normal WOB, CTAB  Abd: Obese, normoactive, nontender MSK: Asymmetric with RLE > LLE, trace to 1+ edema Skin: No rashes or lesions to exposed skin  Neuro: Alert and interactive  Psych: euthymic, appropriate    Data review:   Labs reviewed, notable for:   Lactate 1.9 Bicarb 18, anion gap 15 Creatinine 2.2 (baseline 1.3) High-sensitivity Trop 19 -> 361 Blood counts unremarkable UA positive  for hyaline casts  Micro:  Results for orders placed or performed during the hospital encounter of 01/30/22  Resp panel by RT-PCR (RSV, Flu A&B, Covid) Anterior Nasal Swab     Status: None   Collection Time: 01/30/22  3:30 PM   Specimen: Anterior Nasal Swab  Result Value Ref Range Status   SARS Coronavirus 2 by RT PCR NEGATIVE NEGATIVE Final    Comment: (NOTE) SARS-CoV-2 target nucleic acids are NOT DETECTED.  The SARS-CoV-2 RNA is generally detectable in upper respiratory specimens during the acute phase of infection.  The lowest concentration of SARS-CoV-2 viral copies this assay can detect is 138 copies/mL. A negative result does not preclude SARS-Cov-2 infection and should not be used as the sole basis for treatment or other patient management decisions. A negative result may occur with  improper specimen collection/handling, submission of specimen other than nasopharyngeal swab, presence of viral mutation(s) within the areas targeted by this assay, and inadequate number of viral copies(<138 copies/mL). A negative result must be combined with clinical observations, patient history, and epidemiological information. The expected result is Negative.  Fact Sheet for Patients:  BloggerCourse.com  Fact Sheet for Healthcare Providers:  SeriousBroker.it  This test is no t yet approved or cleared by the [place]  FDA and  has been authorized for detection and/or diagnosis of SARS-CoV-2 by FDA under an Emergency Use Authorization (EUA). This EUA will remain  in effect (meaning this test can be used) for the duration of the COVID-19 declaration under Section 564(b)(1) of the Act, 21 U.S.C.section 360bbb-3(b)(1), unless the authorization is terminated  or revoked sooner.       Influenza A by PCR NEGATIVE NEGATIVE Final   Influenza B by PCR NEGATIVE NEGATIVE Final    Comment: (NOTE) The Xpert Xpress SARS-CoV-2/FLU/RSV plus assay  is intended as an aid in the diagnosis of influenza from Nasopharyngeal swab specimens and should not be used as a sole basis for treatment. Nasal washings and aspirates are unacceptable for Xpert Xpress SARS-CoV-2/FLU/RSV testing.  Fact Sheet for Patients: BloggerCourse.com  Fact Sheet for Healthcare Providers: SeriousBroker.it  This test is not yet approved or cleared by the [place]  FDA and has been authorized for detection and/or diagnosis of SARS-CoV-2 by FDA under an Emergency Use Authorization (EUA). This EUA will remain in effect (meaning this test can be used) for the duration of the COVID-19 declaration under Section 564(b)(1) of the Act, 21 U.S.C. section 360bbb-3(b)(1), unless the authorization is terminated or revoked.     Resp Syncytial Virus by PCR NEGATIVE NEGATIVE Final    Comment: (NOTE) Fact Sheet for Patients: BloggerCourse.com  Fact Sheet for Healthcare Providers: SeriousBroker.it  This test is not yet approved or cleared by the [place]  FDA and has been authorized for detection and/or diagnosis of SARS-CoV-2 by FDA under an Emergency Use Authorization (EUA). This EUA will remain in effect (meaning this test can be used) for the duration of the COVID-19 declaration under Section 564(b)(1) of the Act, 21 U.S.C. section 360bbb-3(b)(1), unless the authorization is terminated or revoked.  Performed at [facility], [address] [place], [place] [address]     Imaging reviewed:  DG Chest 1 View Result Date: 06/14/2023 CLINICAL DATA:  Care refused by patient. EXAM: CHEST  1 VIEW COMPARISON:  Jun 08, 2023 FINDINGS: The heart size and mediastinal contours are within normal limits. Mild, stable left basilar linear scarring and/or atelectasis is seen. No acute infiltrate, pleural effusion or pneumothorax is identified. The visualized skeletal structures are  unremarkable. IMPRESSION: Mild, stable left basilar linear scarring and/or atelectasis. Electronically Signed   By: Virgle Grime M.D.   On: 06/14/2023 23:23    EKG:  Personally reviewed, sinus rhythm, PRWP, lateral/inferior Q waves, no acute ischemic changes.  ED Course:  Treated with 1 L IV fluid   Assessment/Plan:  63 y.o. male with hx CAD with PCI LAD, HF improved EF, paroxysmal SVT, CKD3a, hypertension, OSA, recent ED visit on 5/3 after presumed episode of SVT at home discharged on propranolol  prn, who returns after another episode of  tachycardia.  Subsequent hypotension after taking propranolol  prn in addition to his home meds.  Suspect recurrent SVT Tachyarrhythmia History of prior paroxysmal SVT although Zio patch in ' 22 with sinus rhythm and rare PAC/PVC.  No captured arrhythmia during hospitalizations per chart review.  Recent ED visit for same 5/3 Rx'd as needed propranolol .  On this admission reports irregular/skipped beat followed by rapid heart rate in the 140s for an hour.  Took propranolol  prn without relief of symptoms.  By time of ED arrival in sinus rhythm with rate in 60s.   - Cardiology consult, contacted fellow Dr. Ossie Blend who will see the patient.  - For now holding his beta-blocker considering his hypotension in the ED, will resume this first as BP allows. - Anticipate he may benefit from cardiac monitor and discharge  Hypotension, hypovolemic/med adverse effect Initial BP in the ED 70 over 50s, improved 200s over 50s with 1 L IV fluid.  Has other signs of volume depletion including AKI stage I, borderline lactate, UA with hyaline cast. - Given additional 500 cc IV fluid then continue oral hydration. - Hold his home GDMT including carvedilol , Entresto , spironolactone , Jardiance , hydralazine .  Hold his home torsemide .  Would resume his beta-blocker first as BP improves due to his SVT.  Non-STEMI, suspect type II event History of CAD with PCI to LAD Reported  chest pain to ED provider but denied on my interview.  EKG with no acute ischemic changes.  High-sensitivity Trop 19 -> 361.  Suspect this is a demand event in the setting of his SVT and hypotension. - Loaded with aspirin  324 mg x 1.  Continue his home aspirin  81 mg and Plavix  75 mg daily. - Hold off on heparin  as suspect demand event. - Check lipids and A1c  AKI stage I CKD 3 A Baseline creatinine approximately 1.3, elevated to 2.2 on admission.  Suspect prerenal -IV fluids per above - Check PVR  Chronic medical problems: Heart failure improved EF: Last TTE 2/'25 LVEF 55 to 60%, grade 1 diastolic dysfunction.  Without acute exacerbation.  See above re: GDMT/diuretic currently on hold. Hypertension: See management above, antihypertensives on hold. OSA: CPAP nightly.   Body mass index is 36.69 kg/m.  Obesity class II, would benefit from weight loss outpatietn.  DVT prophylaxis:  Lovenox  Code Status:  Full Code Diet:  Diet Orders (From admission, onward)    None      Family Communication:  None   Consults:  Cardiology   Admission status:   Observation, Telemetry bed  Severity of Illness: The appropriate patient status for this patient is OBSERVATION. Observation status is judged to be reasonable and necessary in order to provide the required intensity of service to ensure the patient's safety. The patient's presenting symptoms, physical exam findings, and initial radiographic and laboratory data in the context of their medical condition is felt to place them at decreased risk for further clinical deterioration. Furthermore, it is anticipated that the patient will be medically stable for discharge from the hospital within 2 midnights of admission.    Arnulfo Larch, MD [facility]  How to contact the [facility] Attending or Consulting provider 7A - 7P or covering provider during after hours 7P -7A, for this patient.  Check the care team in [facility] and look for a)  attending/consulting [facility] provider listed and b) the [facility] team listed Log into [email or web address] and use [facility]'s universal password to access. If you do not have the password, please contact the hospital operator.  Locate the [facility] provider you are looking for under [facility] and page to a number that you can be directly reached. If you still have difficulty reaching the provider, please page the [facility] (Director on Call) for the Hospitalists listed on amion for assistance.  06/15/2023, 2:47 AM

## 2023-06-15 NOTE — Plan of Care (Signed)

## 2023-06-16 DIAGNOSIS — R000 Tachycardia, unspecified: Secondary | ICD-10-CM | POA: Diagnosis not present

## 2023-06-16 LAB — BASIC METABOLIC PANEL WITH GFR
Anion gap: 8 (ref 5–15)
BUN: 19 mg/dL (ref 8–23)
CO2: 23 mmol/L (ref 22–32)
Calcium: 8.4 mg/dL — ABNORMAL LOW (ref 8.9–10.3)
Chloride: 108 mmol/L (ref 98–111)
Creatinine, Ser: 1.48 mg/dL — ABNORMAL HIGH (ref 0.61–1.24)
GFR, Estimated: 53 mL/min — ABNORMAL LOW (ref >60)
Glucose, Bld: 99 mg/dL (ref 70–99)
Potassium: 4.1 mmol/L (ref 3.5–5.1)
Sodium: 139 mmol/L (ref 135–145)

## 2023-06-16 MED ORDER — CARVEDILOL 6.25 MG PO TABS: 6.2500 mg | ORAL_TABLET | Freq: Two times a day (BID) | ORAL | 2 refills | Status: DC

## 2023-06-16 MED ORDER — SACUBITRIL-VALSARTAN 97-103 MG PO TABS
1.0000 | ORAL_TABLET | Freq: Two times a day (BID) | ORAL | Status: DC
  Administered 2023-06-16: 1 via ORAL
  Filled 2023-06-16: qty 1

## 2023-06-16 NOTE — Discharge Summary (Signed)
 Physician Discharge Summary   Patient: Dean Wilson MRN: [id number] DOB: 11/05/1960  Admit date:     06/14/2023  Discharge date: 06/16/23  Discharge Physician: MDALA-GAUSI, Jilda Most   PCP: Orlena Bitters, MD   Recommendations at discharge:    Follow up with Cardiology.   Discharge Diagnoses: Principal Problem:   Tachyarrhythmia Active Problems:   NSTEMI (non-ST elevated myocardial infarction) (HCC)   Hypotension   AKI (acute kidney injury) (HCC)  Resolved Problems:   * No resolved hospital problems. *  Hospital Course: 63 y.o. male with hx of CAD with PCI LAD, HF improved EF, paroxysmal SVT, CKD3a, hypertension, OSA, recent ED visit on 5/3 after presumed episode of SVT at home discharged on propranolol  prn, who returned after another episode of tachycardia.     Cardiology was consulted.  Patient felt to have SVT induced chest pain and NSTEMI in the setting of paroxysmal SVT and underlying CAD. Found to have hypotension and associated AKI.    AKI resolved (creatinine trended down from 2.2 on admission to 1.48 on the day of discharge).  The rest of the hospital course is in problem-based format below:     Assessment and Plan: Suspect recurrent SVT Tachyarrhythmia History of prior paroxysmal SVT although Zio patch in ' 22 with sinus rhythm and rare PAC/PVC.  No captured arrhythmia during hospitalizations per chart review.   Recent ED visit for same 5/3 and was prescribed as needed propranolol .  On this admission reports irregular/skipped beat followed by rapid heart rate in the 140s for an hour.   Took propranolol  prn without relief of symptoms.   By time of ED arrival in sinus rhythm with rate in 60s but patient was hypotensive. Cardiology was consulted. Beta-blocker was held. He was observed on telemetry overnight and had no further episodes of SVT. Cardiac monitor ordered by cardiology. Propranolol  discontinued.   Hypotension, hypovolemic/med adverse effect H/o  hypertension on multiple agents at home Initial BP in the ED 70 over 50s, improved 200s over 50s with 1 L IV fluid.  Has other signs of volume depletion including AKI stage I, borderline lactate, UA with hyaline cast. Blood pressure trended up. At discharge, medication regimen as follows: - Continued Entresto . - Continued spironolactone . - Continued as needed torsemide . - Reduced dose of carvedilol  from 25 mg twice daily to 6.25 mg twice daily. - Discontinued amlodipine  and hydralazine . - Discontinued as needed propranolol    Non-STEMI, suspect type II event History of CAD with PCI to LAD Reported chest pain to ED provider.   EKG with no acute ischemic changes.   High-sensitivity Trop 19 -> 361.  Suspect this is a demand event in the setting of his SVT and hypotension.  Loaded with aspirin  324 mg x 1 and continued his home aspirin  81 mg and Plavix  75 mg daily. Seen by Cardiology.   Heart failure improved EF:  Last TTE 2/25 LVEF 55 to 60%, grade 1 diastolic dysfunction.   Without acute exacerbation.   GDMT was held on presentation due to hypotension and AKI.  Resumed at discharge with changes as indicated above.  AKI stage I CKD 3 A Baseline creatinine approximately 1.3, elevated to 2.2 on admission.  Suspect prerenal AKI resolved with IV fluids.  Creatinine was 1.48 at discharge.  OSA CPAP nightly.  Class II Obesity Body mass index is 36.96 kg/m.  For outpatient follow up.        Consultants: Cardiology Procedures performed: n/a  Disposition: Home Diet recommendation:  Discharge  Diet Orders (From admission, onward)     Start     Ordered   06/16/23 0000  Diet - low sodium heart healthy        06/16/23 1152           Cardiac diet DISCHARGE MEDICATION: Allergies as of 06/16/2023       Reactions   Banana Itching   Sunflower Oil Itching   Seeds not oil        Medication List     STOP taking these medications    amLODipine  5 MG tablet Commonly known  as: NORVASC    hydrALAZINE  25 MG tablet Commonly known as: APRESOLINE    propranolol  10 MG tablet Commonly known as: INDERAL        TAKE these medications    Aspirin  Low Dose 81 MG tablet Generic drug: aspirin  EC Take 1 tablet (81 mg total) by mouth daily.   carvedilol  6.25 MG tablet Commonly known as: COREG  Take 1 tablet (6.25 mg total) by mouth 2 (two) times daily with a meal. What changed:  medication strength how much to take when to take this   clopidogrel  75 MG tablet Commonly known as: PLAVIX  Take 75 mg by mouth daily.   empagliflozin  10 MG Tabs tablet Commonly known as: Jardiance  Take 1 tablet (10 mg total) by mouth daily before breakfast.   ezetimibe  10 MG tablet Commonly known as: ZETIA  Take 1 tablet (10 mg total) by mouth daily.   fluticasone 50 MCG/ACT nasal spray Commonly known as: FLONASE Place 1 spray into both nostrils daily as needed for allergies.   meclizine  25 MG tablet Commonly known as: ANTIVERT  Take 1 tablet (25 mg total) by mouth 3 (three) times daily as needed for up to 15 doses for dizziness.   multivitamin tablet Take 1 tablet by mouth daily.   pantoprazole  40 MG tablet Commonly known as: Protonix  Take 1 tablet (40 mg total) by mouth daily.   rosuvastatin  40 MG tablet Commonly known as: CRESTOR  Take 1 tablet (40 mg total) by mouth daily.   sacubitril -valsartan  97-103 MG Commonly known as: Entresto  Take 1 tablet by mouth 2 (two) times daily.   spironolactone  25 MG tablet Commonly known as: ALDACTONE  Take 1 tablet (25 mg total) by mouth daily.   torsemide  20 MG tablet Commonly known as: DEMADEX  Take 1 tablet (20 mg total) by mouth daily as needed. What changed: reasons to take this        Discharge Exam: Filed Weights   06/14/23 2100 06/16/23 0508  Weight: 122.7 kg 123.6 kg   Physical Exam on Day of Discharge   General: Alert, cheerful, oriented X3  Oral cavity: moist mucous membranes  Neck: supple  Chest: clear  to auscultation. No crackles, no wheezes  CVS: S1,S2 RRR. No murmurs  Abd: No distention, soft, non-tender. No masses palpable  Extr: No edema    Condition at discharge: stable  The results of significant diagnostics from this hospitalization (including imaging, microbiology, ancillary and laboratory) are listed below for reference.   Imaging Studies: DG Chest 1 View Result Date: 06/14/2023 CLINICAL DATA:  Care refused by patient. EXAM: CHEST  1 VIEW COMPARISON:  Jun 08, 2023 FINDINGS: The heart size and mediastinal contours are within normal limits. Mild, stable left basilar linear scarring and/or atelectasis is seen. No acute infiltrate, pleural effusion or pneumothorax is identified. The visualized skeletal structures are unremarkable. IMPRESSION: Mild, stable left basilar linear scarring and/or atelectasis. Electronically Signed   By: Elizabeth Gulling.D.  On: 06/14/2023 23:23   CT CHEST WO CONTRAST Result Date: 06/08/2023 CLINICAL DATA:  63 year old male with palpitations and abnormal lung opacity on chest x-rays. EXAM: CT CHEST WITHOUT CONTRAST TECHNIQUE: Multidetector CT imaging of the chest was performed following the standard protocol without IV contrast. RADIATION DOSE REDUCTION: This exam was performed according to the departmental dose-optimization program which includes automated exposure control, adjustment of the mA and/or kV according to patient size and/or use of iterative reconstruction technique. COMPARISON:  Chest x-rays today. Report of CTA chest 05/13/2018 (no images available). FINDINGS: Cardiovascular: Calcified aortic atherosclerosis. Extensive calcified coronary artery atherosclerosis (series 3, image 86). No cardiomegaly or pericardial effusion. Vascular patency is not evaluated in the absence of IV contrast. Mediastinum/Nodes: No mediastinal mass. Mediastinal lymph nodes are within normal limits. Lungs/Pleura: Fine peribronchial calcifications in the anterior basal segment  of the right lower lobe appear to be postinflammatory. There is a left lower lobe 5 x 8 mm lung nodule which is associated with linear streaky opacity and is mostly calcified, postinflammatory and also this was described in 2020 (no follow-up imaging recommended). Indistinct ground-glass opacity in the right upper lobe is most confluent in an area of 3-4 cm on series 4, image 46 and series 6, image 115. Mild contralateral anterior left upper lobe subpleural scarring. No consolidation. No pleural effusion. Major airways are patent. Upper Abdomen: Negative visible noncontrast liver, gallbladder, spleen, pancreas, adrenal glands and bowel in the upper abdomen. Partially visible bilateral nephrolithiasis. Musculoskeletal: Thoracic spine degeneration. No acute or suspicious osseous lesion identified. IMPRESSION: 1. Indistinct ground-glass opacity in the right upper lobe is most confluent in an area of 3-4 cm. Per Fleischner Society Guidelines, recommend a non-contrast Chest CT at 6 months to confirm persistence, then additional non-contrast Chest CTs every 2 years until 5 years. If nodule grows or develops solid component(s), consider resection. These guidelines do not apply to immunocompromised patients and patients with cancer. Follow up in patients with significant comorbidities as clinically warranted. For lung cancer screening, adhere to Lung-RADS guidelines. Reference: Radiology. 2017; 284(1):228-43. 2. No other acute process identified in the noncontrast Chest. Small calcified and benign postinflammatory nodules in the lower lobes. 3. Extensive calcified coronary artery, Aortic Atherosclerosis (ICD10-I70.0). 4. Partially visible bilateral nephrolithiasis. Electronically Signed   By: Marlise Simpers M.D.   On: 06/08/2023 10:41   DG Chest 2 View Result Date: 06/08/2023 CLINICAL DATA:  Palpitations. EXAM: CHEST - 2 VIEW COMPARISON:  01/30/2022 FINDINGS: Ill-defined nodular density identified right lower lung. Streaky  density in the lateral left base is probably atelectasis or scarring. No pulmonary edema or substantial pleural effusion. Hyperexpansion suggests emphysema. The cardiopericardial silhouette is within normal limits for size. No acute bony abnormality. Dip down IMPRESSION: 1. Ill-defined nodular density right lower lung. CT chest without contrast recommended to further evaluate. 2. Streaky density in the lateral left base is probably atelectasis or scarring. Electronically Signed   By: Donnal Fusi M.D.   On: 06/08/2023 08:32    Microbiology: Results for orders placed or performed during the hospital encounter of 01/30/22  Resp panel by RT-PCR (RSV, Flu A&B, Covid) Anterior Nasal Swab     Status: None   Collection Time: 01/30/22  3:30 PM   Specimen: Anterior Nasal Swab  Result Value Ref Range Status   SARS Coronavirus 2 by RT PCR NEGATIVE NEGATIVE Final    Comment: (NOTE) SARS-CoV-2 target nucleic acids are NOT DETECTED.  The SARS-CoV-2 RNA is generally detectable in upper respiratory specimens during the  acute phase of infection. The lowest concentration of SARS-CoV-2 viral copies this assay can detect is 138 copies/mL. A negative result does not preclude SARS-Cov-2 infection and should not be used as the sole basis for treatment or other patient management decisions. A negative result may occur with  improper specimen collection/handling, submission of specimen other than nasopharyngeal swab, presence of viral mutation(s) within the areas targeted by this assay, and inadequate number of viral copies(<138 copies/mL). A negative result must be combined with clinical observations, patient history, and epidemiological information. The expected result is Negative.  Fact Sheet for Patients:  BloggerCourse.com  Fact Sheet for Healthcare Providers:  SeriousBroker.it  This test is no t yet approved or cleared by the [place]  FDA and  has  been authorized for detection and/or diagnosis of SARS-CoV-2 by FDA under an Emergency Use Authorization (EUA). This EUA will remain  in effect (meaning this test can be used) for the duration of the COVID-19 declaration under Section 564(b)(1) of the Act, 21 U.S.C.section 360bbb-3(b)(1), unless the authorization is terminated  or revoked sooner.       Influenza A by PCR NEGATIVE NEGATIVE Final   Influenza B by PCR NEGATIVE NEGATIVE Final    Comment: (NOTE) The Xpert Xpress SARS-CoV-2/FLU/RSV plus assay is intended as an aid in the diagnosis of influenza from Nasopharyngeal swab specimens and should not be used as a sole basis for treatment. Nasal washings and aspirates are unacceptable for Xpert Xpress SARS-CoV-2/FLU/RSV testing.  Fact Sheet for Patients: BloggerCourse.com  Fact Sheet for Healthcare Providers: SeriousBroker.it  This test is not yet approved or cleared by the [place]  FDA and has been authorized for detection and/or diagnosis of SARS-CoV-2 by FDA under an Emergency Use Authorization (EUA). This EUA will remain in effect (meaning this test can be used) for the duration of the COVID-19 declaration under Section 564(b)(1) of the Act, 21 U.S.C. section 360bbb-3(b)(1), unless the authorization is terminated or revoked.     Resp Syncytial Virus by PCR NEGATIVE NEGATIVE Final    Comment: (NOTE) Fact Sheet for Patients: BloggerCourse.com  Fact Sheet for Healthcare Providers: SeriousBroker.it  This test is not yet approved or cleared by the [place]  FDA and has been authorized for detection and/or diagnosis of SARS-CoV-2 by FDA under an Emergency Use Authorization (EUA). This EUA will remain in effect (meaning this test can be used) for the duration of the COVID-19 declaration under Section 564(b)(1) of the Act, 21 U.S.C. section 360bbb-3(b)(1), unless the  authorization is terminated or revoked.  Performed at [facility], [address], [place], [place] [address]     Labs: CBC: Recent Labs  Lab 06/14/23 2107 06/15/23 0158  WBC 8.9 8.0  HGB 16.0 14.4  HCT 49.4 45.1  MCV 85.5 86.7  PLT 225 147*   Basic Metabolic Panel: Recent Labs  Lab 06/14/23 2132 06/15/23 0158 06/16/23 0235  NA 140 142 139  K 3.5 4.2 4.1  CL 107 110 108  CO2 18* 21* 23  GLUCOSE 117* 116* 99  BUN 24* 25* 19  CREATININE 2.20* 1.79* 1.48*  CALCIUM  9.6 8.8* 8.4*  MG  --  1.9  --   PHOS  --  3.7  --    Liver Function Tests: Recent Labs  Lab 06/14/23 2132  AST 19  ALT 16  ALKPHOS 43  BILITOT 1.1  PROT 6.2*  ALBUMIN 3.7   CBG: No results for input(s): "GLUCAP" in the last 168 hours.  Discharge time spent: greater than 30  minutes.  Signed: MDALA-GAUSI, Kamariyah Timberlake AGATHA, MD [facility] 06/16/2023

## 2023-06-16 NOTE — Progress Notes (Signed)
 Progress Note  Patient Name: Dean Wilson Date of Encounter: 06/16/2023 Primary Cardiologist: Dean Favre, MD   Subjective   Overnight BP has recovered. AKI has improved Patient notes no CP, SOB, Palpitations. Since he started working out more he wonder if his new hypotension is related to activity.   Vital Signs    Vitals:   06/15/23 1953 06/15/23 2255 06/16/23 0043 06/16/23 0508  BP: (!) 155/85  133/84 (!) 160/91  Pulse: 60 64 60 61  Resp: 18 20 20 20   Temp: 98 F (36.7 C)  97.6 F (36.4 C) 97.6 F (36.4 C)  TempSrc: Oral  Axillary Oral  SpO2: 98% 98% 97% 94%  Weight:    123.6 kg  Height:        Intake/Output Summary (Last 24 hours) at 06/16/2023 0735 Last data filed at 06/15/2023 1632 Gross per 24 hour  Intake 880 ml  Output --  Net 880 ml   Filed Weights   06/14/23 2100 06/16/23 0508  Weight: 122.7 kg 123.6 kg    Physical Exam   GEN: No acute distress.  Morbid Obesity Neck: No JVD Cardiac: RRR, no murmurs, rubs, or gallops.  Respiratory: Clear to auscultation bilaterally. GI: Soft, nontender, non-distended  MS: No edema  Labs   Telemetry: Sinus rhythm    Chemistry Recent Labs  Lab 06/14/23 2132 06/15/23 0158 06/16/23 0235  NA 140 142 139  K 3.5 4.2 4.1  CL 107 110 108  CO2 18* 21* 23  GLUCOSE 117* 116* 99  BUN 24* 25* 19  CREATININE 2.20* 1.79* 1.48*  CALCIUM  9.6 8.8* 8.4*  PROT 6.2*  --   --   ALBUMIN 3.7  --   --   AST 19  --   --   ALT 16  --   --   ALKPHOS 43  --   --   BILITOT 1.1  --   --   GFRNONAA 33* 42* 53*  ANIONGAP 15 11 8      Hematology Recent Labs  Lab 06/14/23 2107 06/15/23 0158  WBC 8.9 8.0  RBC 5.78 5.20  HGB 16.0 14.4  HCT 49.4 45.1  MCV 85.5 86.7  MCH 27.7 27.7  MCHC 32.4 31.9  RDW 15.3 15.3  PLT 225 147*   Cardiac Studies   Cardiac Studies & Procedures   ______________________________________________________________________________________________ CARDIAC CATHETERIZATION  CARDIAC  CATHETERIZATION 01/31/2022  Conclusion   Ost LAD to Prox LAD lesion is 50% stenosed.   Ost Cx to Prox Cx lesion is 30% stenosed.   Mid RCA to Dist RCA lesion is 40% stenosed.   1st Diag lesion is 95% stenosed.   2nd Mrg lesion is 100% stenosed.   Dist RCA lesion is 60% stenosed.   Mid LAD lesion is 50% stenosed.   Previously placed Mid LAD to Dist LAD stent of unknown type is  widely patent.  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 Continue DAPT with ASA and plavix  x 12 months. Reported SVT on presentation possible Type II MI from demand ischemia.  Findings Coronary Findings Diagnostic  Dominance: Right  Left Anterior Descending Ost LAD to Prox LAD lesion is 50% stenosed. Mid  LAD lesion is 50% stenosed. Lesion unchanged from the previous study Previously placed Mid LAD to Dist LAD stent of unknown type is  widely patent.  First Diagonal Branch 1st Diag lesion is 95% stenosed.  Left Circumflex Ost Cx to Prox Cx lesion is 30% stenosed.  Second Obtuse Marginal Branch Collaterals 2nd Mrg filled by collaterals from 3rd RPL.  2nd Mrg lesion is 100% stenosed.  Right Coronary Artery Mid RCA to Dist RCA lesion is 40% stenosed. Dist RCA lesion is 60% stenosed. This lesion is associated with diffuse ectasia and a small filling defect suggestive of thrombus  Third Right Posterolateral Branch Collaterals 3rd RPL filled by collaterals from Dist Cx.  Intervention  No interventions have been documented.   CARDIAC CATHETERIZATION  CARDIAC CATHETERIZATION 05/20/2020  Conclusion  Prox  LAD lesion is 50% stenosed.  Mid LAD lesion is 50% stenosed.  Dist LAD lesion is 95% stenosed.  A drug-eluting stent was successfully placed using a SYNERGY XD 2.75X28.  Post intervention, there is a 0% residual stenosis.  1. Severe stenosis mid LAD 2. Successful PTCA/DES x 1 mid LAD 3. See Dr. Andria Wilson report from the diagnostic cath for further details of the other vessels  Recommendations: DAPT with ASA and Plavix  for at least six months. Same day post PCI discharge.  Findings Coronary Findings Diagnostic  Dominance: Right  Left Anterior Descending Prox LAD lesion is 50% stenosed. Mid LAD lesion is 50% stenosed. Dist LAD lesion is 95% stenosed. The lesion is calcified.  Intervention  Dist LAD lesion Stent CATH VISTA GUIDE 6FR XBLAD3.5 guide catheter was inserted. Lesion crossed with guidewire using a WIRE COUGAR XT STRL 190CM. Pre-stent angioplasty was performed using a BALLOON SAPPHIRE 2.0X20. A drug-eluting stent was successfully placed using a SYNERGY XD 2.75X28. Stent strut is well apposed. Post-stent angioplasty was performed using a BALLOON SAPPHIRE [place] 3.0X18. Post-Intervention Lesion Assessment The intervention was successful. Pre-interventional TIMI flow is 3. Post-intervention TIMI flow is 3. No complications occurred at this lesion. There is a 0% residual stenosis post intervention.     ECHOCARDIOGRAM  ECHOCARDIOGRAM COMPLETE 03/20/2023  Narrative ECHOCARDIOGRAM REPORT    Patient Name:   Dean Wilson Date of Exam: 03/20/2023 Medical Rec #:  [id number]           Height:       72.0 in Accession #:    [id number]          Weight:       274.4 lb Date of Birth:  [birth year]           BSA:          2.437 m Patient Age:    62 years            BP:           138/84 mmHg Patient Gender: M                   HR:           65 bpm. Exam Location:  [facility]  Procedure: 2D Echo, Color Doppler and Cardiac Doppler (Both Spectral and Color Flow Doppler were utilized  during procedure).  Indications:    Coronary artery disease.  History:        Patient has prior history of Echocardiogram examinations, most recent 01/31/2022. CHF, CAD, Stroke, Arrythmias:Abnormal ECG; Risk Factors:Morbid obesity, Hypertension, Dyslipidemia, Diabetes and Former Smoker.  Sonographer:    Dean Wilson RCS, RVS Referring Phys: [address] G MCDOWELL  Sonographer Comments: Global longitudinal strain was attempted. IMPRESSIONS   1. Left ventricular ejection fraction, by estimation, is 55 to 60%. The left ventricle has normal function. The left ventricle has no regional wall motion abnormalities. There is mild left ventricular hypertrophy. Left ventricular diastolic parameters are consistent with Grade I diastolic dysfunction (impaired relaxation). Elevated left atrial pressure. 2. Right ventricular systolic function is normal. The right ventricular size is normal. Tricuspid regurgitation signal is inadequate for assessing PA pressure. 3. The mitral valve is normal in structure. Trivial mitral valve regurgitation. No evidence of mitral stenosis. 4. The aortic valve is tricuspid. Aortic valve regurgitation is not visualized. No aortic stenosis is present. 5. Aortic dilatation noted. There is mild dilatation of the ascending aorta, measuring 37 mm.  Comparison(s): EF 50-55%. Moderate LVH.  FINDINGS Left Ventricle: Left ventricular ejection fraction, by estimation, is 55 to 60%. The left ventricle has normal function. The left ventricle has no regional wall motion abnormalities. Global longitudinal strain performed but not reported based on interpreter judgement due to suboptimal tracking. The left ventricular internal cavity size was normal in size. There is mild left ventricular hypertrophy. Left ventricular diastolic parameters are consistent with Grade I diastolic dysfunction (impaired relaxation). Elevated left atrial pressure.  Right Ventricle: The right ventricular  size is normal. Right vetricular wall thickness was not well visualized. Right ventricular systolic function is normal. Tricuspid regurgitation signal is inadequate for assessing PA pressure.  Left Atrium: Left atrial size was normal in size.  Right Atrium: Right atrial size was normal in size.  Pericardium: There is no evidence of pericardial effusion.  Mitral Valve: The mitral valve is normal in structure. There is mild thickening of the mitral valve leaflet(s). There is mild calcification of the mitral valve leaflet(s). Mild mitral annular calcification. Trivial mitral valve regurgitation. No evidence of mitral valve stenosis. MV peak gradient, 6.1 mmHg. The mean mitral valve gradient is 2.0 mmHg.  Tricuspid Valve: The tricuspid valve is normal in structure. Tricuspid valve regurgitation is not demonstrated. No evidence of tricuspid stenosis.  Aortic Valve: The aortic valve is tricuspid. Aortic valve regurgitation is not visualized. No aortic stenosis is present. Aortic valve mean gradient measures 5.0 mmHg. Aortic valve peak gradient measures 7.6 mmHg. Aortic valve area, by VTI measures 2.36 cm.  Pulmonic Valve: The pulmonic valve was not well visualized. Pulmonic valve regurgitation is not visualized. No evidence of pulmonic stenosis.  Aorta: The aortic root is normal in size and structure and aortic dilatation noted. There is mild dilatation of the ascending aorta, measuring 37 mm.  IAS/Shunts: No atrial level shunt detected by color flow Doppler.  Additional Comments: 3D was performed not requiring image post processing on an independent workstation and was normal.   LEFT VENTRICLE PLAX 2D LVIDd:         5.20 cm      Diastology LVIDs:         2.70 cm      LV e' medial:    4.68 cm/s LV PW:         1.10 cm      LV E/e' medial:  18.8 LV IVS:        1.10 cm      LV e' lateral:   5.77 cm/s LVOT diam:     2.00 cm      LV E/e' lateral: 15.3 LV SV:         76 LV SV Index:   31 LVOT  Area:  3.14 cm  LV Volumes (MOD) LV vol d, MOD A2C: 97.8 ml LV vol d, MOD A4C: 129.0 ml LV vol s, MOD A2C: 29.4 ml LV vol s, MOD A4C: 46.9 ml LV SV MOD A2C:     68.4 ml LV SV MOD A4C:     129.0 ml LV SV MOD BP:      78.7 ml  RIGHT VENTRICLE RV Basal diam:  3.10 cm RV Mid diam:    3.30 cm RV S prime:     13.50 cm/s TAPSE (M-mode): 2.2 cm  LEFT ATRIUM             Index        RIGHT ATRIUM           Index LA diam:        4.10 cm 1.68 cm/m   RA Area:     18.90 cm LA Vol (A2C):   63.6 ml 26.10 ml/m  RA Volume:   46.70 ml  19.16 ml/m LA Vol (A4C):   46.0 ml 18.88 ml/m LA Biplane Vol: 55.7 ml 22.86 ml/m AORTIC VALVE                     PULMONIC VALVE AV Area (Vmax):    2.44 cm      PV Vmax:       1.06 m/s AV Area (Vmean):   2.03 cm      PV Peak grad:  4.5 mmHg AV Area (VTI):     2.36 cm AV Vmax:           138.00 cm/s AV Vmean:          105.000 cm/s AV VTI:            0.321 m AV Peak Grad:      7.6 mmHg AV Mean Grad:      5.0 mmHg LVOT Vmax:         107.00 cm/s LVOT Vmean:        68.000 cm/s LVOT VTI:          0.241 m LVOT/AV VTI ratio: 0.75  AORTA Ao Root diam: 3.30 cm Ao Asc diam:  3.70 cm  MITRAL VALVE MV Area (PHT): 2.71 cm     SHUNTS MV Area VTI:   2.01 cm     Systemic VTI:  0.24 m MV Peak grad:  6.1 mmHg     Systemic Diam: 2.00 cm MV Mean grad:  2.0 mmHg MV Vmax:       1.23 m/s MV Vmean:      65.2 cm/s MV Decel Time: 280 msec MV E velocity: 88.10 cm/s MV A velocity: 119.00 cm/s MV E/A ratio:  0.74  Armida Lander MD Electronically signed by Armida Lander MD Signature Date/Time: 03/21/2023/10:02:18 AM    Final    MONITORS  LONG TERM MONITOR (3-14 DAYS) 06/08/2020  Narrative Patch Wear Time:  4 days and 16 hours (2022-04-13T10:53:27-0400 to 2022-04-18T03:36:31-0400)  1. Sinus rhythm - avg HR of 69 bpm. 2. Rare PACs and PVCs.  Normal monitor.  Jules Oar, MD 11:21 PM     CARDIAC MRI  MR CARDIAC MORPHOLOGY W WO CONTRAST  05/06/2020  Narrative CLINICAL DATA:  23M with new heart failure. Possible apical thrombus on echo  EXAM: CARDIAC MRI  TECHNIQUE: The patient was scanned on a 1.5 Tesla Siemens magnet. A dedicated cardiac coil was used. Functional imaging was done using Fiesta sequences. 2,3, and 4 chamber views were done to  assess for RWMA's. Modified Simpson's rule using a short axis stack was used to calculate an ejection fraction on a dedicated work [employer]. The patient received 15 cc of Gadavist . After 10 minutes inversion recovery sequences were used to assess for infiltration and scar tissue.  CONTRAST:  15 cc  of Gadavist   FINDINGS: Left ventricle:  -Moderate asymmetric hypertrophy measuring up to 14mm in basal septum (8mm in posterior wall), not meeting criteria for HCM (<57mm)  -Normal size  -Moderate systolic dysfunction  -Normal ECV (24%)  -Subendocardial LGE in basal to apical lateral wall and mid inferior wall. LGE <50% transmural  -RV insertion site LGE  LV EF: 32% (Normal 56-78%)  Absolute volumes:  LV EDV: (Normal 77-195 mL)  LV ESV: (Normal 19-72 mL)  LV SV: 57mL (Normal 51-133 mL)  CO: 3.5L/min (Normal 2.8-8.8 L/min)  Indexed volumes:  LV EDV: 41mL/sq-m (Normal 47-92 mL/sq-m)  LV ESV: 63mL/sq-m (Normal 13-30 mL/sq-m)  LV SV: 16mL/sq-m (Normal 32-62 mL/sq-m)  CI: 1.4L/min/sq-m (Normal 1.7-4.2 L/min/sq-m)  Right ventricle: Small size with normal systolic function  RV EF:  59% (Normal 47-74%)  Absolute volumes:  RV EDV: 94mL (Normal 88-227 mL)  RV ESV: 39mL (Normal 23-103 mL)  RV SV: 56mL (Normal 52-138 mL)  CO: 3.4L/min (Normal 2.8-8.8 L/min)  Indexed volumes:  RV EDV: 23mL/sq-m (Normal 55-105 mL/sq-m)  RV ESV: 46mL/sq-m (Normal 15-43 mL/sq-m)  RV SV: 24mL/sq-m (Normal 32-64 mL/sq-m)  CI: 1.4L/min/sq-m (Normal 1.7-4.2 L/min/sq-m)  Left atrium: Mild enlargement  Right atrium: Normal size  Mitral  valve: Trivial regurgitation  Aortic valve: No regurgitation  Tricuspid valve: No regurgitation  Pulmonic valve: No regurgitation  Aorta: Normal proximal ascending aorta  Pericardium: Normal  IMPRESSION: 1. Normal LV size, moderate hypertrophy, and moderate systolic dysfunction (EF 32%)  2. Subendocardial late gadolinium enhancement consistent with prior myocardial infarction in the basal to apical lateral wall and mid inferior wall. LGE is <50% transmural in these regions suggesting myocardium is viable.  3. RV insertion site LGE, which is a nonspecific finding often seen in setting of elevated pulmonary pressures  4.  Small RV size with normal systolic function (EF 59%)  5.  No apical thrombus seen   Electronically Signed By: Carson Clara MD On: 05/06/2020 23:54   ______________________________________________________________________________________________       Assessment & Plan   CAD - no evidence of no event, continue ASA and plavix ; long term primary cardiologist may wish to deescalate to plavix  monotherapy, last cath in 2023 - with exercise he has lost weight and improved heart rate, BP drop may be related to new exercise program - drop coreg  to 6.25 mg PO BID - continue HLD therapies (rosuvastatin  and zetia )  HTN with HF recovered EF and CKD Morbid obesity - continue Entresto  and GDMT (coreg  as above, jardiance  10, Entresto  97/103, aldactone  25); stopping hydralazine  and norvasc ; at follow up if BP is elevated return norvasc  5 mg at follow up  Hx of SVT - stop propranolol  - repeat non live heart monitor  Needs f/u with Dr. Anselm Basset team  For questions or updates, please contact [facility] Please consult [email or web address] for contact info under Cardiology/STEMI.      Gloriann Larger, MD FASE [facility] Cardiologist [facility]  [address] [place], [address] [place], [place] [address] [phone]  7:35 AM

## 2023-07-04 ENCOUNTER — Encounter: Payer: Self-pay | Admitting: Physician Assistant

## 2023-07-04 ENCOUNTER — Ambulatory Visit: Attending: Student | Admitting: Physician Assistant

## 2023-07-04 ENCOUNTER — Ambulatory Visit: Attending: Physician Assistant

## 2023-07-04 VITALS — BP 150/110 | HR 65 | Ht 72.0 in

## 2023-07-04 DIAGNOSIS — I25119 Atherosclerotic heart disease of native coronary artery with unspecified angina pectoris: Secondary | ICD-10-CM | POA: Diagnosis not present

## 2023-07-04 DIAGNOSIS — R002 Palpitations: Secondary | ICD-10-CM

## 2023-07-04 DIAGNOSIS — I5032 Chronic diastolic (congestive) heart failure: Secondary | ICD-10-CM

## 2023-07-04 DIAGNOSIS — I10 Essential (primary) hypertension: Secondary | ICD-10-CM

## 2023-07-04 DIAGNOSIS — E782 Mixed hyperlipidemia: Secondary | ICD-10-CM

## 2023-07-04 DIAGNOSIS — G4733 Obstructive sleep apnea (adult) (pediatric): Secondary | ICD-10-CM

## 2023-07-04 MED ORDER — AMLODIPINE BESYLATE 5 MG PO TABS: 5.0000 mg | ORAL_TABLET | Freq: Every day | ORAL | 3 refills | Status: DC

## 2023-07-04 NOTE — Patient Instructions (Signed)
 Medication Instructions:   Stop Taking Plavix   Start Taking Norvasc  5 mg Daily   2 Week Blood pressure log   *If you need a refill on your cardiac medications before your next appointment, please call your pharmacy*  Lab Work: NONE   If you have labs (blood work) drawn today and your tests are completely normal, you will receive your results only by: MyChart Message (if you have MyChart) OR A paper copy in the mail If you have any lab test that is abnormal or we need to change your treatment, we will call you to review the results.  Testing/Procedures: ZIO XT- Long Term Monitor Instructions   Your physician has requested you wear your ZIO patch monitor__14___days.   This is a single patch monitor.  [employer] supplies one patch monitor per enrollment.  Additional stickers are not available.   Please do not apply patch if you will be having a Nuclear Stress Test, Echocardiogram, Cardiac CT, MRI, or Chest Xray during the time frame you would be wearing the monitor. The patch cannot be worn during these tests.  You cannot remove and re-apply the ZIO XT patch monitor.   Your ZIO patch monitor will be sent USPS Priority mail from [facility] directly to your home address. The monitor may also be mailed to a PO BOX if home delivery is not available.   It may take 3-5 days to receive your monitor after you have been enrolled.   Once you have received you monitor, please review enclosed instructions.  Your monitor has already been registered assigning a specific monitor serial # to you.   Applying the monitor   Shave hair from upper left chest.   Hold abrader disc by orange tab.  Rub abrader in 40 strokes over left upper chest as indicated in your monitor instructions.   Clean area with 4 enclosed alcohol pads .  Use all pads to assure are is cleaned thoroughly.  Let dry.   Apply patch as indicated in monitor instructions.  Patch will be place under collarbone on left side of  chest with arrow pointing upward.   Rub patch adhesive wings for 2 minutes.Remove white label marked "1".  Remove white label marked "2".  Rub patch adhesive wings for 2 additional minutes.   While looking in a mirror, press and release button in center of patch.  A small green light will flash 3-4 times .  This will be your only indicator the monitor has been turned on.     Do not shower for the first 24 hours.  You may shower after the first 24 hours.   Press button if you feel a symptom. You will hear a small click.  Record Date, Time and Symptom in the Patient Log Book.   When you are ready to remove patch, follow instructions on last 2 pages of Patient Log Book.  Stick patch monitor onto last page of Patient Log Book.   Place Patient Log Book in [place] box.  Use locking tab on box and tape box closed securely.  The [employer] and [employer] has [employer] on it.  Please place in mailbox as soon as possible.  Your physician should have your test results approximately 7 days after the monitor has been mailed back to [facility].   Call [facility] Customer Care at [phone] if you have questions regarding your ZIO XT patch monitor.  Call them immediately if you see an orange light blinking on your monitor.  If your monitor falls off in less than 4 days contact our Monitor department at [phone].  If your monitor becomes loose or falls off after 4 days call [employer] at [phone] for suggestions on securing your monitor.    Follow-Up: At [facility], you and your health needs are our priority.  As part of our continuing mission to provide you with exceptional heart care, our providers are all part of one team.  This team includes your primary Cardiologist (physician) and Advanced Practice Providers or APPs (Physician Assistants and Nurse Practitioners) who all work together to provide you with the care you need, when you need it.  Your next appointment:   2 -3  month(s)  Provider:   You may see Teddie Favre, MD or one of the following Advanced Practice Providers on your designated Care Team:   Woodfin Hays, PA-C  [place], [place] Theotis Flake, [place]     We recommend signing up for the patient portal called "MyChart".  Sign up information is provided on this After Visit Summary.  MyChart is used to connect with patients for Virtual Visits (Telemedicine).  Patients are able to view lab/test results, encounter notes, upcoming appointments, etc.  Non-urgent messages can be sent to your provider as well.   To learn more about what you can do with MyChart, go to [email or web address].   Other Instructions Thank you for choosing [facility]!

## 2023-07-04 NOTE — Progress Notes (Signed)
 Cardiology Office Note:  .   Date:  07/04/2023  ID:  Dean Wilson, DOB 06-18-1960, MRN [id number] PCP: Orlena Bitters, MD  [facility] Providers Cardiologist:  Teddie Favre, MD { History of Present Illness: Dean Wilson   Dean Wilson is a 63 y.o. 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   Recently seen in hospital at [facility] 5/9-12/2023 for tachyarrhythmia.  Cardiology was consulted and noted SVT induced CP and NSTEMI (TN 19 >361) in the setting of paroxysmal SVT underlying CAD.  Hospitalization also complicated by AKI (Cr 2.2 >1.48) and hypotension, which both resolved.  Decreased Coreg  from 25 mg twice daily to 6.25 mg twice daily.   Discontinued amlodipine , hydralazine , and as needed propranolol .  Continued on ASA 81 mg, Plavix  75 mg, Jardiance  10 mg, Zetia  10 mg, Crestor  40 mg, Entresto  97/103 mg twice daily, spironolactone  25 mg, torsemide  20 mg PRN.   On interview, reported following medication recommendation per hospital discharge. Home BP 160/110 when waking before taking meds. Rechecked BP 1-2 hr after meds, BP was 150/100. Followed up with PCP who recommended increasing Coreg  to 12.5 mg BID. No changes in BP then recommended restarting Hydralazine   25 mg TID. No changes in BP then recommended increasing Coreg  to 25 mg BID. BP remain 150/100. Since being discharged from hospital, notes 1-2 episode of skipped beat or hearing heart thump, last 15 mins, at rest. No recurrent CP, SOB, dizziness, syncope, edema.   Able to exercise on stationary bike x 30 mins and 0.5 miles on treadmills about 3-4x/ week. Noted healthy eating habitat with low  sodium. Reports 1 gallon of water , caffeine free soda, 1 cup of decaf coffee. Lives with wife. Able to do own grocery shopping without concerns. 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  Reports compliance with medications.  Denies tobacco use/Binging ETOH/drug use  Studies Reviewed: Dean Wilson   ECHO IMPRESSIONS 03/2023  1. Left ventricular ejection fraction, by estimation, is 55 to 60%. The  left ventricle has normal function. The left ventricle has no regional  wall motion abnormalities. There is mild left ventricular hypertrophy.  Left ventricular diastolic parameters  are consistent with Grade I diastolic dysfunction (impaired relaxation).  Elevated left atrial pressure.   2. Right ventricular systolic function is normal. The right ventricular  size is normal. Tricuspid regurgitation signal is inadequate for assessing  PA pressure.   3. The mitral valve is normal in structure. Trivial mitral valve  regurgitation. No evidence of mitral stenosis.   4. The aortic valve is tricuspid. Aortic valve regurgitation is not  visualized. No aortic stenosis is present.   5. Aortic dilatation noted. 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   Comparison(s): EF 50-55%. Moderate LVH.   CATH 01/2022 Conclusion   Ost LAD to Prox LAD lesion is 50% stenosed.   Ost Cx to Prox Cx lesion is 30% stenosed.   Mid RCA to Dist RCA lesion is 40% stenosed.   1st Diag lesion is 95% stenosed.   2nd Mrg lesion is 100% stenosed.   Dist RCA lesion is 60% stenosed.   Mid LAD lesion is 50% stenosed.  Previously placed Mid LAD to Dist LAD stent of unknown type is  widely patent.   Multivessel CAD with: Patent left main Proximal LAD ectasia, nonobstructive mid-LAD stenosis, and patency of the LAD stented segment in the mid-distal vessel Chronic occlusion of the second OM branch of the circumflex with right-to-left collaterals Moderate diffuse distal RCA stenosis without  significant change from prior cath study, coronary ectasia, and small filling defect in area of ectasia   Recommend: continued medical therapy. Continue DAPT with ASA and plavix  x 12 months. Reported SVT on presentation possible Type II MI from demand ischemia.  Diagnostic Dominance: Right   Heart Monitor 06/2020 Patch Wear Time:  4 days and 16 hours (2022-04-13T10:53:27-0400 to 2022-04-18T03:36:31-0400) 1. Sinus rhythm - avg HR of 69 bpm.  2. Rare PACs and PVCs. Normal monitor.    Risk Assessment/Calculations:     HYPERTENSION CONTROL Vitals:   07/04/23 1540 07/04/23 1630  BP: (!) 150/100 (!) 150/110    The patient's blood pressure is elevated above target today.  In order to address the patient's elevated BP: A new medication was prescribed today.          Physical Exam:   VS:  BP (!) 150/110 (BP Location: Right Arm, Patient Position: Sitting, Cuff Size: Large)   Pulse 65   Ht 6' (1.829 m)   SpO2 94%   BMI 36.96 kg/m    Wt Readings from Last 3 Encounters:  06/16/23 272 lb 7.8 oz (123.6 kg)  06/10/23 270 lb 6.4 oz (122.7 kg)  06/08/23 265 lb (120.2 kg)    GEN: Well nourished, well developed in no acute distress NECK: No JVD; No carotid bruits CARDIAC: RRR, no murmurs, rubs, gallops RESPIRATORY:  Clear to auscultation without rales, wheezing or rhonchi  ABDOMEN: Soft, non-tender, non-distended EXTREMITIES:  No edema; No deformity   ASSESSMENT AND PLAN: .    HTN BP in office 150/100, Recheck 150/110 Reports medication compliance and similar home BP to office despite PCP added hydralazine  25 mg 3 times daily and increasing Coreg  to 25 mg twice daily.  Noted prior to hospitalization BP was controlled.  Continue on Coreg  25 BID, hydralazine  25 mg TID, Entresto  97/103 mg twice daily, spironolactone  25 mg daily  Ordered amlodipine  5 mg daily BP monitor x 2 weeks If BP still elevated, can consider increasing Coreg  (if heart monitor abnormal), amlodipine  (if no  edema),or hydralazine .   Palpitations  2022 monitor: NSR, HR 69, rare PAC and PVCs.  Only wore for 4 days due to excessive sweating and monitor not staying attached.  Recent hospitalization in 06/2023 noted SVT Since being discharged from hospital, notes 1-2 episode of skipped beat or hearing heart thump, last 15 mins, at rest.  Ordered heart monitor for 2 weeks Based on results, can consider increasing coreg  if indicated.   HFimpEF ECHO 03/2023: 55-60%, 01/2022: 50-55%, 08/2021:60-65%, 03/2020: 55-60%, 04/2020: 40-45%.  06/2023: K 4.1, Cr 1.48  Denied any SOB or edema.  Continue on Coreg  25 mg twice daily, Jardiance  10 mg, Entresto  97/103 mg twice daily, spironolactone , torsemide  20 mg as needed  CAD HLD S/p DES mid LAD in 05/2020 cath 01/2022: nonobstructive atherosclerosis within the LAD, chronic occlusion of the second OM with right to left collaterals, and moderate nonobstructive disease within the RCA. 05/2023 LDL 97, 06/2023: AST/ALT 19/26 Per Dr. Londa Rival note in 02/2023, can d/c Plavix  and continue ASA.  Continue ASA 81 mg, Crestor  40 mg, Zetia  10 mg, Coreg  25 mg BID  Encouraged low  cholesterol diet.   OSA Noted daily compliance with CPAP.     Dispo: Ordered  amlodipine  5 mg daily, BP log X 2 weeks, D/C Plavix , heart monitor x 2 weeks, follow-up in 2 to 3 months  Signed, Metta Actis, PA-C

## 2023-07-05 ENCOUNTER — Other Ambulatory Visit: Payer: Self-pay

## 2023-07-05 ENCOUNTER — Emergency Department (HOSPITAL_COMMUNITY)

## 2023-07-05 ENCOUNTER — Encounter (HOSPITAL_COMMUNITY): Payer: Self-pay

## 2023-07-05 ENCOUNTER — Observation Stay (HOSPITAL_COMMUNITY)
Admission: EM | Admit: 2023-07-05 | Discharge: 2023-07-09 | Disposition: A | Discharge status: Home / Self Care | Attending: Student | Admitting: Student

## 2023-07-05 DIAGNOSIS — R0989 Other specified symptoms and signs involving the circulatory and respiratory systems: Secondary | ICD-10-CM | POA: Insufficient documentation

## 2023-07-05 DIAGNOSIS — I2510 Atherosclerotic heart disease of native coronary artery without angina pectoris: Secondary | ICD-10-CM | POA: Diagnosis not present

## 2023-07-05 DIAGNOSIS — Z79899 Other long term (current) drug therapy: Secondary | ICD-10-CM | POA: Insufficient documentation

## 2023-07-05 DIAGNOSIS — I959 Hypotension, unspecified: Secondary | ICD-10-CM | POA: Insufficient documentation

## 2023-07-05 DIAGNOSIS — R778 Other specified abnormalities of plasma proteins: Secondary | ICD-10-CM | POA: Insufficient documentation

## 2023-07-05 DIAGNOSIS — Z8673 Personal history of transient ischemic attack (TIA), and cerebral infarction without residual deficits: Secondary | ICD-10-CM | POA: Insufficient documentation

## 2023-07-05 DIAGNOSIS — G4733 Obstructive sleep apnea (adult) (pediatric): Secondary | ICD-10-CM | POA: Diagnosis not present

## 2023-07-05 DIAGNOSIS — Z955 Presence of coronary angioplasty implant and graft: Secondary | ICD-10-CM | POA: Insufficient documentation

## 2023-07-05 DIAGNOSIS — I214 Non-ST elevation (NSTEMI) myocardial infarction: Secondary | ICD-10-CM | POA: Diagnosis not present

## 2023-07-05 DIAGNOSIS — I480 Paroxysmal atrial fibrillation: Secondary | ICD-10-CM | POA: Diagnosis not present

## 2023-07-05 DIAGNOSIS — I130 Hypertensive heart and chronic kidney disease with heart failure and stage 1 through stage 4 chronic kidney disease, or unspecified chronic kidney disease: Secondary | ICD-10-CM | POA: Diagnosis not present

## 2023-07-05 DIAGNOSIS — N189 Chronic kidney disease, unspecified: Secondary | ICD-10-CM | POA: Diagnosis not present

## 2023-07-05 DIAGNOSIS — Z87891 Personal history of nicotine dependence: Secondary | ICD-10-CM | POA: Diagnosis not present

## 2023-07-05 DIAGNOSIS — Z7901 Long term (current) use of anticoagulants: Secondary | ICD-10-CM | POA: Insufficient documentation

## 2023-07-05 DIAGNOSIS — Z7982 Long term (current) use of aspirin: Secondary | ICD-10-CM | POA: Diagnosis not present

## 2023-07-05 DIAGNOSIS — F419 Anxiety disorder, unspecified: Secondary | ICD-10-CM | POA: Insufficient documentation

## 2023-07-05 DIAGNOSIS — Z7902 Long term (current) use of antithrombotics/antiplatelets: Secondary | ICD-10-CM | POA: Insufficient documentation

## 2023-07-05 DIAGNOSIS — D519 Vitamin B12 deficiency anemia, unspecified: Secondary | ICD-10-CM | POA: Diagnosis not present

## 2023-07-05 DIAGNOSIS — I471 Supraventricular tachycardia, unspecified: Secondary | ICD-10-CM | POA: Insufficient documentation

## 2023-07-05 DIAGNOSIS — R000 Tachycardia, unspecified: Principal | ICD-10-CM

## 2023-07-05 DIAGNOSIS — I4891 Unspecified atrial fibrillation: Secondary | ICD-10-CM | POA: Insufficient documentation

## 2023-07-05 DIAGNOSIS — R079 Chest pain, unspecified: Secondary | ICD-10-CM | POA: Diagnosis present

## 2023-07-05 DIAGNOSIS — I5030 Unspecified diastolic (congestive) heart failure: Secondary | ICD-10-CM | POA: Diagnosis not present

## 2023-07-05 DIAGNOSIS — N179 Acute kidney failure, unspecified: Secondary | ICD-10-CM | POA: Diagnosis not present

## 2023-07-05 LAB — CBC
HCT: 50.4 % (ref 39.0–52.0)
Hemoglobin: 16.1 g/dL (ref 13.0–17.0)
MCH: 27.6 pg (ref 26.0–34.0)
MCHC: 31.9 g/dL (ref 30.0–36.0)
MCV: 86.4 fL (ref 80.0–100.0)
Platelets: 205 10*3/uL (ref 150–400)
RBC: 5.83 MIL/uL — ABNORMAL HIGH (ref 4.22–5.81)
RDW: 16.1 % — ABNORMAL HIGH (ref 11.5–15.5)
WBC: 8.8 10*3/uL (ref 4.0–10.5)
nRBC: 0 % (ref 0.0–0.2)

## 2023-07-05 LAB — BRAIN NATRIURETIC PEPTIDE: B Natriuretic Peptide: 376.7 pg/mL — ABNORMAL HIGH (ref 0.0–100.0)

## 2023-07-05 LAB — BASIC METABOLIC PANEL WITH GFR
Anion gap: 12 (ref 5–15)
BUN: 32 mg/dL — ABNORMAL HIGH (ref 8–23)
CO2: 20 mmol/L — ABNORMAL LOW (ref 22–32)
Calcium: 9.2 mg/dL (ref 8.9–10.3)
Chloride: 107 mmol/L (ref 98–111)
Creatinine, Ser: 2.36 mg/dL — ABNORMAL HIGH (ref 0.61–1.24)
GFR, Estimated: 30 mL/min — ABNORMAL LOW (ref >60)
Glucose, Bld: 128 mg/dL — ABNORMAL HIGH (ref 70–99)
Potassium: 4 mmol/L (ref 3.5–5.1)
Sodium: 139 mmol/L (ref 135–145)

## 2023-07-05 LAB — TROPONIN I (HIGH SENSITIVITY)
Troponin I (High Sensitivity): 35 ng/L — ABNORMAL HIGH (ref <18)
Troponin I (High Sensitivity): 773 ng/L (ref <18)

## 2023-07-05 LAB — TSH: TSH: 2.047 u[IU]/mL (ref 0.350–4.500)

## 2023-07-05 LAB — MAGNESIUM: Magnesium: 1.9 mg/dL (ref 1.7–2.4)

## 2023-07-05 MED ORDER — AMLODIPINE BESYLATE 5 MG PO TABS: 5.0000 mg | ORAL_TABLET | Freq: Every day | ORAL | Status: DC

## 2023-07-05 MED ORDER — ACETAMINOPHEN 650 MG RE SUPP: 650.0000 mg | Freq: Four times a day (QID) | RECTAL | Status: DC | PRN

## 2023-07-05 MED ORDER — ACETAMINOPHEN 325 MG PO TABS: 650.0000 mg | ORAL_TABLET | Freq: Four times a day (QID) | ORAL | Status: DC | PRN

## 2023-07-05 MED ORDER — PANTOPRAZOLE SODIUM 40 MG PO TBEC
40.0000 mg | DELAYED_RELEASE_TABLET | Freq: Every day | ORAL | Status: DC
  Administered 2023-07-05 – 2023-07-09 (×5): 40 mg via ORAL
  Filled 2023-07-05 (×5): qty 1

## 2023-07-05 MED ORDER — ALPRAZOLAM 0.5 MG PO TABS
0.5000 mg | ORAL_TABLET | Freq: Every evening | ORAL | Status: DC | PRN
  Administered 2023-07-05 – 2023-07-08 (×4): 0.5 mg via ORAL
  Filled 2023-07-05 (×4): qty 1

## 2023-07-05 MED ORDER — SODIUM CHLORIDE 0.9 % IV BOLUS
1000.0000 mL | Freq: Once | INTRAVENOUS | Status: AC
  Administered 2023-07-05: 1000 mL via INTRAVENOUS

## 2023-07-05 MED ORDER — LACTATED RINGERS IV SOLN: INTRAVENOUS | Status: AC

## 2023-07-05 MED ORDER — ROSUVASTATIN CALCIUM 20 MG PO TABS
40.0000 mg | ORAL_TABLET | Freq: Every day | ORAL | Status: DC
  Administered 2023-07-05 – 2023-07-09 (×5): 40 mg via ORAL
  Filled 2023-07-05 (×5): qty 2

## 2023-07-05 MED ORDER — ASPIRIN 81 MG PO TBEC
81.0000 mg | DELAYED_RELEASE_TABLET | Freq: Every day | ORAL | Status: DC
  Administered 2023-07-05 – 2023-07-08 (×4): 81 mg via ORAL
  Filled 2023-07-05 (×4): qty 1

## 2023-07-05 MED ORDER — ONDANSETRON HCL 4 MG/2ML IJ SOLN: 4.0000 mg | Freq: Four times a day (QID) | INTRAMUSCULAR | Status: DC | PRN

## 2023-07-05 MED ORDER — TORSEMIDE 20 MG PO TABS: 20.0000 mg | ORAL_TABLET | Freq: Every day | ORAL | Status: DC

## 2023-07-05 MED ORDER — EZETIMIBE 10 MG PO TABS
10.0000 mg | ORAL_TABLET | Freq: Every day | ORAL | Status: DC
  Administered 2023-07-05 – 2023-07-09 (×5): 10 mg via ORAL
  Filled 2023-07-05 (×5): qty 1

## 2023-07-05 MED ORDER — CARVEDILOL 12.5 MG PO TABS: 25.0000 mg | ORAL_TABLET | Freq: Two times a day (BID) | ORAL | Status: DC

## 2023-07-05 NOTE — ED Provider Notes (Signed)
 [place] EMERGENCY DEPARTMENT AT [facility] Provider Note   CSN: [id number] Arrival date & time: 07/05/23  0410     History  Chief Complaint  Patient presents with   Chest Pain   Level 5 caveat due to acuity of condition Dean Wilson is a 63 y.o. male.  The history is provided by the patient and the spouse.  Patient with extensive history presents with chest pain and palpitations.  Patient reports he woke up to go to the restroom he started having chest pain, heart racing and noted that his heart rate was up to 140.  He has had these episodes previously.  He took his usual nighttime medications and came to the hospital.  He reports lightheadedness but no syncope.  He reports shortness of breath and fatigue. He is currently wearing a heart monitor at home He was recently admitted for similar symptoms     Home Medications Prior to Admission medications   Medication Sig Start Date End Date Taking? Authorizing Provider  amLODipine  (NORVASC ) 5 MG tablet Take 1 tablet (5 mg total) by mouth daily. 07/04/23 10/02/23  Metta Actis, PA-C  ASPIRIN  LOW DOSE 81 MG tablet Take 1 tablet (81 mg total) by mouth daily. 03/20/23   Gerard Knight, MD  carvedilol  (COREG ) 25 MG tablet Take 25 mg by mouth 2 (two) times daily. 06/26/23   [provider]  empagliflozin  (JARDIANCE ) 10 MG TABS tablet Take 1 tablet (10 mg total) by mouth daily before breakfast. 03/20/23   Gerard Knight, MD  ezetimibe  (ZETIA ) 10 MG tablet Take 1 tablet (10 mg total) by mouth daily. 06/10/23 09/08/23  Flo Hummingbird, PA-C  fluticasone (FLONASE) 50 MCG/ACT nasal spray Place 1 spray into both nostrils daily as needed for allergies. 05/20/23   [provider]  hydrALAZINE  (APRESOLINE ) 25 MG tablet Take 25 mg by mouth 3 (three) times daily.    [provider]  meclizine  (ANTIVERT ) 25 MG tablet Take 1 tablet (25 mg total) by mouth 3 (three) times daily as needed for up to 15 doses  for dizziness. Patient not taking: Reported on 07/04/2023 10/07/20   Billie Budge, MD  Multiple Vitamin (MULTIVITAMIN) tablet Take 1 tablet by mouth daily.    [provider]  pantoprazole  (PROTONIX ) 40 MG tablet Take 1 tablet (40 mg total) by mouth daily. 01/26/11 06/09/96  Ruby Corporal, MD  rosuvastatin  (CRESTOR ) 40 MG tablet Take 1 tablet (40 mg total) by mouth daily. 03/20/23   Gerard Knight, MD  sacubitril -valsartan  (ENTRESTO ) 97-103 MG Take 1 tablet by mouth 2 (two) times daily. 03/20/23   Gerard Knight, MD  spironolactone  (ALDACTONE ) 25 MG tablet Take 1 tablet (25 mg total) by mouth daily. 03/20/23   Gerard Knight, MD  torsemide  (DEMADEX ) 20 MG tablet Take 1 tablet (20 mg total) by mouth daily as needed. 03/20/23   Gerard Knight, MD      Allergies    Banana and Sunflower oil    Review of Systems   Review of Systems  Unable to perform ROS: Acuity of condition    Physical Exam Updated Vital Signs BP 92/60   Pulse 81   Temp 98.2 F (36.8 C) (Oral)   Resp 13   Ht 1.829 m (6')   Wt 123.4 kg   SpO2 99%   BMI 36.89 kg/m  Physical Exam CONSTITUTIONAL: Ill-appearing, diaphoretic HEAD: Normocephalic/atraumatic EYES: EOMI/PERRL ENMT: Mucous membranes moist NECK: supple no meningeal signs  SPINE/BACK:entire spine nontender CV: Initially tachycardic, now bradycardic LUNGS: Lungs are clear to auscultation bilaterally, no apparent distress ABDOMEN: soft, nontender, obese NEURO: Pt is awake/alert/appropriate, moves all extremitiesx4.  No facial droop.   EXTREMITIES: pulses normal/equal, full ROM SKIN: Pale and diaphoretic  ED Results / Procedures / Treatments   Labs (all labs ordered are listed, but only abnormal results are displayed) Labs Reviewed  BASIC METABOLIC PANEL WITH GFR - Abnormal; Notable for the following components:      Result Value   CO2 20 (*)    Glucose, Bld 128 (*)    BUN 32 (*)    Creatinine, Ser 2.36 (*)    GFR, Estimated 30  (*)    All other components within normal limits  CBC - Abnormal; Notable for the following components:   RBC 5.83 (*)    RDW 16.1 (*)    All other components within normal limits  TROPONIN I (HIGH SENSITIVITY) - Abnormal; Notable for the following components:   Troponin I (High Sensitivity) 35 (*)    All other components within normal limits  MAGNESIUM   TROPONIN I (HIGH SENSITIVITY)    EKG ED ECG REPORT   Date: 07/05/2023 0436am  Rate: 38  Rhythm: sinus bradycardia  QRS Axis: normal  Intervals: normal  ST/T Wave abnormalities: normal  Conduction Disutrbances:none  Narrative Interpretation:   Old EKG Reviewed: changes noted  I have personally reviewed the EKG tracing and agree with the computerized printout as noted.    Radiology DG Chest Port 1 View Result Date: 07/05/2023 CLINICAL DATA:  63 year old male with chest pain. EXAM: PORTABLE CHEST 1 VIEW COMPARISON:  Portable chest 06/14/2023 and earlier. FINDINGS: Portable AP supine views at 0442 hours. Left chest pacer, resuscitation pads in place. Lung volumes and mediastinal contours are stable and within normal limits. Visualized tracheal air column is within normal limits. Allowing for portable technique the lungs are clear. No pneumothorax or pleural effusion identified on these supine views. No acute osseous abnormality identified. Paucity of bowel gas. IMPRESSION: No acute cardiopulmonary abnormality. Electronically Signed   By: Marlise Simpers M.D.   On: 07/05/2023 05:06    Procedures .Critical Care  Performed by: Eldon Greenland, MD Authorized by: Eldon Greenland, MD   Critical care provider statement:    Critical care time (minutes):  60   Critical care start time:  07/05/2023 4:45 AM   Critical care end time:  07/05/2023 5:45 AM   Critical care time was exclusive of:  Separately billable procedures and treating other patients   Critical care was necessary to treat or prevent imminent or life-threatening deterioration of  the following conditions:  Cardiac failure, circulatory failure and shock   Critical care was time spent personally by me on the following activities:  Development of treatment plan with patient or surrogate, examination of patient, re-evaluation of patient's condition, review of old charts, pulse oximetry, ordering and review of radiographic studies, ordering and review of laboratory studies, ordering and performing treatments and interventions, obtaining history from patient or surrogate, evaluation of patient's response to treatment and discussions with consultants   I assumed direction of critical care for this patient from another provider in my specialty: no     Care discussed with: admitting provider       Medications Ordered in ED Medications  acetaminophen  (TYLENOL ) tablet 650 mg (has no administration in time range)    Or  acetaminophen  (TYLENOL ) suppository 650 mg (has no administration in time range)  ondansetron  (ZOFRAN ) injection  4 mg (has no administration in time range)  lactated ringers  infusion (has no administration in time range)  sodium chloride  0.9 % bolus 1,000 mL (0 mLs Intravenous Stopped 07/05/23 0532)  sodium chloride  0.9 % bolus 1,000 mL (0 mLs Intravenous Stopped 07/05/23 1324)    ED Course/ Medical Decision Making/ A&P Clinical Course as of 07/05/23 0648  Fri Jul 05, 2023  0446 Patient with recent evaluations and admissions for SVT induced non-STEMI. Tonight he arrived tachycardic and hypotensive, while was in the room he became abruptly bradycardic into the 30s and 40s. Patient is ill-appearing.  Workup is pending at this time [DW]  0501 Patient now feeling improved, heart rate is improved, blood pressures improved [DW]  0536 Discussed the case with on-call cardiology fellow dr Austine Blunt Did request hospitalist admission, but cardiology consulting.  Patient will need to have a EP consultation.  [DW]  4010 Creatinine(!): 2.36 Acute kidney injury [DW]  0646 Troponin  I (High Sensitivity)(!): 35 Minimally elevated troponin likely due to tachycardia [DW]  0646 Patient stable, without symptoms and resting comfortably.  Blood pressure has become softer but likely due to sleeping.  Heart rate has remained stable.  Discussed with Dr. Brock Canner for admission [DW]    Clinical Course User Index [DW] Eldon Greenland, MD                                 Medical Decision Making Amount and/or Complexity of Data Reviewed Labs: ordered. Decision-making details documented in ED Course. Radiology: ordered.  Risk Decision regarding hospitalization.   This patient presents to the ED for concern of chest pain, this involves an extensive number of treatment options, and is a complaint that carries with it a high risk of complications and morbidity.  The differential diagnosis includes but is not limited to acute coronary syndrome, aortic dissection, pulmonary embolism, pericarditis, pneumothorax, pneumonia, myocarditis, pleurisy, esophageal rupture, SVT, atrial fibrillation, atrial flutter    Comorbidities that complicate the patient evaluation: Patient's presentation is complicated by their history of CAD and obesity  Additional history obtained: Additional history obtained from spouse Records reviewed previous admission documents  Lab Tests: I Ordered, and personally interpreted labs.  The pertinent results include: Acute kidney injury  Imaging Studies ordered: I ordered imaging studies including X-ray chest  I independently visualized and interpreted imaging which showed no acute findings I agree with the radiologist interpretation  Cardiac Monitoring: The patient was maintained on a cardiac monitor.  I personally viewed and interpreted the cardiac monitor which showed an underlying rhythm of:  Initially tachycardic, now bradycardic  Medicines ordered and prescription drug management: I ordered medication including IV fluids for hypotension Reevaluation  of the patient after these medicines showed that the patient    improved   Critical Interventions:   IV fluids, admission  Consultations Obtained: I requested consultation with the admitting physician [facility] and consultant cardiology, and discussed  findings as well as pertinent plan - they recommend: Admit  Reevaluation: After the interventions noted above, I reevaluated the patient and found that they have :improved  Complexity of problems addressed: Patient's presentation is most consistent with  acute presentation with potential threat to life or bodily function  Disposition: After consideration of the diagnostic results and the patient's response to treatment,  I feel that the patent would benefit from admission  .           Final Clinical Impression(s) / ED  Diagnoses Final diagnoses:  Tachyarrhythmia  AKI (acute kidney injury) [facility])    Rx / DC Orders ED Discharge Orders     None         Eldon Greenland, MD 07/05/23 [phone]

## 2023-07-05 NOTE — H&P (Addendum)
 History and Physical    Patient: Dean Wilson ZOX:[id number] DOB: 1960-05-28 DOA: 07/05/2023 DOS: the patient was seen and examined on 07/05/2023 PCP: Orlena Bitters, MD  Patient coming from: Home  Chief Complaint:  Chief Complaint  Patient presents with   Chest Pain   HPI: Dean Wilson is a 63 y.o. 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 03/2023: 55-60%), SVT, CVA, CKD, HTN, and OSA on CPAP p/w chest tightness.  Pt states that he was in his USOH until waking this morning at 0530 and experiencing chest tightness. Given his complex cardiac history, the pt put on his watch which monitors his HR and also checked his blood pressure. He became concerned over the next 2 hours when his HR increased to 130s, BP dropped to 120/80s (lower than his typical 160/110s), and his chest pain increased from 2 to 5; as such, he activated EMS.  Of note, "[pt] has a hx of recurrent tachyrhythmia/SVT with induced Type II NSTEMI since this 2023 admission. He has had 2 prior hospital presentations this month (no formal 12-lead ECG to confirm SVT, his last confirmed SVT was 02/2016 that was treated with adenosine ). On 5/3 presented to ED with tachyrhythmia and discharged with prn propanol. Subsequently he represented to the ED on 06/14/23 and was admitted for tachyarrhythmia induced chest pain and NSTEMI (type II with troponin elevation to 361) due to underlying CAD. Admission was complicated by AKI (Cr 2.2 initially, discharged with 1.48) and hypotension (responsive to fluids)."  In the ED, pt presented bradycardic and hypotensive on RA. Labs notable for BUN/Cr 32/2.36 (baseline 1.5-1.7), and troponin 35-->773. ECG showed accelerated juinctional rhythm followed by bradycardia. Pt admitted to medicine for ongoing care with cards following.  Review of Systems: As mentioned in the history of present illness. All other systems reviewed and are  negative. Past Medical History:  Diagnosis Date   CAD (coronary artery disease)    DES to mid LAD April 2022   Essential hypertension    GERD (gastroesophageal reflux disease)    History of cardiomyopathy    OSA on CPAP    PSVT (paroxysmal supraventricular tachycardia) (HCC)    Stroke [facility])    Past Surgical History:  Procedure Laterality Date   CORONARY STENT INTERVENTION N/A 05/20/2020   Procedure: CORONARY STENT INTERVENTION;  Surgeon: Odie Benne, MD;  Location: [facility] INVASIVE CV LAB;  Service: Cardiovascular;  Laterality: N/A;   LEFT HEART CATH AND CORONARY ANGIOGRAPHY N/A 01/31/2022   Procedure: LEFT HEART CATH AND CORONARY ANGIOGRAPHY;  Surgeon: Arnoldo Lapping, MD;  Location: [facility] INVASIVE CV LAB;  Service: Cardiovascular;  Laterality: N/A;   RIGHT/LEFT HEART CATH AND CORONARY ANGIOGRAPHY N/A 05/20/2020   Procedure: RIGHT/LEFT HEART CATH AND CORONARY ANGIOGRAPHY;  Surgeon: Mardell Shade, MD;  Location: [facility] INVASIVE CV LAB;  Service: Cardiovascular;  Laterality: N/A;   UMBILICAL HERNIA REPAIR     Social History:  reports that he quit smoking about 15 years ago. His smoking use included cigarettes. He started smoking about 40 years ago. He has a 50 pack-year smoking history. He has never used smokeless tobacco. He reports that he does not drink alcohol and does not use drugs.  Allergies  Allergen Reactions   Banana Itching   Sunflower Oil Itching    Seeds not oil    Family History  Problem Relation Age of Onset   Arrhythmia Mother    Heart attack Father  Arrhythmia Sister        a-fib   Arrhythmia Brother    Anesthesia problems Neg Hx    Hypotension Neg Hx    Malignant hyperthermia Neg Hx    Pseudochol deficiency Neg Hx     Prior to Admission medications   Medication Sig Start Date End Date Taking? Authorizing Provider  ALPRAZolam (XANAX) 0.5 MG tablet Take 0.5 mg by mouth 2 (two) times daily as needed. 06/19/23  Yes [provider]  amLODipine   (NORVASC ) 5 MG tablet Take 1 tablet (5 mg total) by mouth daily. 07/04/23 10/02/23 Yes Dunlap, Sondra Duran, PA-C  ASPIRIN  LOW DOSE 81 MG tablet Take 1 tablet (81 mg total) by mouth daily. 03/20/23  Yes Gerard Knight, MD  carvedilol  (COREG ) 25 MG tablet Take 25 mg by mouth 2 (two) times daily. 06/26/23  Yes [provider]  empagliflozin  (JARDIANCE ) 10 MG TABS tablet Take 1 tablet (10 mg total) by mouth daily before breakfast. 03/20/23  Yes Gerard Knight, MD  ezetimibe  (ZETIA ) 10 MG tablet Take 1 tablet (10 mg total) by mouth daily. 06/10/23 09/08/23 Yes Flo Hummingbird, PA-C  fluticasone (FLONASE) 50 MCG/ACT nasal spray Place 1 spray into both nostrils daily as needed for allergies. 05/20/23  Yes [provider]  Multiple Vitamin (MULTIVITAMIN) tablet Take 1 tablet by mouth daily.   Yes [provider]  pantoprazole  (PROTONIX ) 40 MG tablet Take 1 tablet (40 mg total) by mouth daily. 01/26/11 06/09/96 Yes Rehman, Mathews Solomons, MD  rosuvastatin  (CRESTOR ) 40 MG tablet Take 1 tablet (40 mg total) by mouth daily. 03/20/23  Yes Gerard Knight, MD  sacubitril -valsartan  (ENTRESTO ) 97-103 MG Take 1 tablet by mouth 2 (two) times daily. 03/20/23  Yes Gerard Knight, MD  spironolactone  (ALDACTONE ) 25 MG tablet Take 1 tablet (25 mg total) by mouth daily. 03/20/23  Yes Gerard Knight, MD  torsemide  (DEMADEX ) 20 MG tablet Take 1 tablet (20 mg total) by mouth daily as needed. 03/20/23  Yes Gerard Knight, MD  azithromycin (ZITHROMAX) 250 MG tablet Take 250 mg by mouth as directed. Patient not taking: Reported on 07/05/2023 07/01/23   [provider]  clopidogrel  (PLAVIX ) 75 MG tablet Take 75 mg by mouth daily. Patient not taking: Reported on 07/05/2023    [provider]    Physical Exam: Vitals:   07/05/23 0534 07/05/23 0535 07/05/23 0720 07/05/23 1140  BP: (!) 88/51 92/60 102/68 (!) 151/98  Pulse: 75 81 75 (!) 58  Resp: 14 13 16  (!) 22  Temp:   (!) 97.1 F  (36.2 C) 97.9 F (36.6 C)  TempSrc:   Oral Oral  SpO2: 100% 99% 100% 100%  Weight:      Height:       General: Alert, oriented x3, resting comfortably in no acute distress Respiratory: Lungs clear to auscultation bilaterally with normal respiratory effort; no w/r/r Cardiovascular: Regular rate and rhythm w/o m/r/g  Data Reviewed:  Lab Results  Component Value Date   WBC 8.8 07/05/2023   HGB 16.1 07/05/2023   HCT 50.4 07/05/2023   MCV 86.4 07/05/2023   PLT 205 07/05/2023   Lab Results  Component Value Date   GLUCOSE 128 (H) 07/05/2023   CALCIUM  9.2 07/05/2023   NA 139 07/05/2023   K 4.0 07/05/2023   CO2 20 (L) 07/05/2023   CL 107 07/05/2023   BUN 32 (H) 07/05/2023   CREATININE 2.36 (H) 07/05/2023   Lab Results  Component Value Date  ALT 16 06/14/2023   AST 19 06/14/2023   ALKPHOS 43 06/14/2023   BILITOT 1.1 06/14/2023   No results found for: "INR", "PROTIME"  Radiology: [facility] Chest Port 1 View Result Date: 07/05/2023 CLINICAL DATA:  63 year old male with chest pain. EXAM: PORTABLE CHEST 1 VIEW COMPARISON:  Portable chest 06/14/2023 and earlier. FINDINGS: Portable AP supine views at 0442 hours. Left chest pacer, resuscitation pads in place. Lung volumes and mediastinal contours are stable and within normal limits. Visualized tracheal air column is within normal limits. Allowing for portable technique the lungs are clear. No pneumothorax or pleural effusion identified on these supine views. No acute osseous abnormality identified. Paucity of bowel gas. IMPRESSION: No acute cardiopulmonary abnormality. Electronically Signed   By: Marlise Simpers M.D.   On: 07/05/2023 05:06    Assessment and Plan: 80M h/o CAD s/p DES mid LAD in 05/2020, HFimEF (initially 04/2020: 40-45%. 03/2023: 55-60%), SVT, CVA, CKD, HTN, and OSA on CPAP p/w chest tightness and elevated troponins c/w NSTEMI, type 2.  NSTEMI, type 2 Elevated troponin Presumed type 2 NSTEMI iso tachyarrhythmia -Cards consulted;  recs: agree with Zio patch; pt will ultimately need an ablation; will discuss/determine BB dose and hold other home antihypertensives; consider renal artery doppler to r/o renal artery stenosis if BP remains difficult to control; trend troponin to peak  H/o HTN -HOLD pta antihypertensives per Cards recs above  H/o anxiety -PTA Xanax 0.5mg  at bedtime prn to prevent withdrawal seizures    Advance Care Planning:   Code Status: Full Code   Consults: Cards  Family Communication: N/A  Severity of Illness: The appropriate patient status for this patient is INPATIENT. Inpatient status is judged to be reasonable and necessary in order to provide the required intensity of service to ensure the patient's safety. The patient's presenting symptoms, physical exam findings, and initial radiographic and laboratory data in the context of their chronic comorbidities is felt to place them at high risk for further clinical deterioration. Furthermore, it is not anticipated that the patient will be medically stable for discharge from the hospital within 2 midnights of admission.   * I certify that at the point of admission it is my clinical judgment that the patient will require inpatient hospital care spanning beyond 2 midnights from the point of admission due to high intensity of service, high risk for further deterioration and high frequency of surveillance required.*   ------- I spent 55 minutes reviewing previous labs/notes, obtaining separate history at the bedside, counseling/discussing the treatment plan outlined above, ordering medications/tests, and performing clinical documentation.  Author: Arne Langdon, MD 07/05/2023 1:06 PM  For on call review [email or web address].

## 2023-07-05 NOTE — Progress Notes (Signed)
  Carryover admission to the Day Admitter.  I discussed this case with the EDP, Dr. Wallis Gun.  Per these discussions:   This is a 63 year old male with history of coronary artery disease, SVT complicated by tachy-bradycardia, ckd 3a/3b (baseline cr ~ 1.5-1.8), who is being admitted with chest pain, as well as additional evidence of tachybradycardia process, after awakening this morning with chest pain associated with diaphoresis, palpitations.   He had seen his cardiologist earlier in the day, at which time a ZioPatch was placed.  Most recent echocardiogram occurred in February 2025 and was notable for LVEF 55 to 60%, no focal wall motion abnormalities, grade 1 diastolic dysfunction and normal right ventricular systolic function.  Upon arriving in the ED this AM, he was tachycardic, with heart rates in the 120s to 140s, with EKG at that time reported to not show overt SVT or afib/flutter. However, while tachycardic, he was noted to be hypotensive with initial systolic blood pressures in the 50s mmHg. this tachyarrhythmia subsequently spontaneously converted to bradycardic rhythm , with HR's into the 30's, followed by sinus bradycardia with HR's in the 50's to 60's, associated with improvement in BP, with SBP's in the 90's mmHg and also a/w resolution of his chest pain.  He remains chest pain-free at this time. Of note, patient has also received 2 liters of IVF in the ED.  EKGs reportedly show no evidence of STEMI.   Initial troponin 35, with repeat pending.  Additional labs are notable for AKI on CKD 3A/3B.  Chest x-ray shows no evidence of acute cardiopulmonary process.  Patient was reported to have been recently hospitalized for similar.  EDP has discussed with on-call [facility] cardiology, who are formally consulting and are planning to involve EP cardiology as well.   I have placed an order for  observation to pcu for further evaluation and management of the above.  I have placed some  additional preliminary admit orders via the adult multi-morbid admission order set. I have also ordered magnesium  level, LR at 100 cc/hr x 8 hours, and have left the pt NPO for now, pending additional recs from cardiology.     Camelia Cavalier, DO Hospitalist

## 2023-07-05 NOTE — Consult Note (Addendum)
 Cardiology Consultation   Patient ID: Dean Wilson MRN: [id number]; DOB: 01/27/1961  Admit date: 07/05/2023 Date of Consult: 07/05/2023  PCP:  Dean Bitters, MD   [facility] Providers Cardiologist:  Dean Favre, MD        Patient Profile: Dean Wilson is a 63 y.o. 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 03/2023: 55-60%), SVT, CVA, CKD, HTN, and OSA on CPAP who is being seen 07/05/2023 for the evaluation of arrhthymias at the request of hospital medicine.  History of Present Illness: Dean Wilson 63 y.o. male with history above who has been seen recently by cardiology. HFrEF diagnosis in March of 2022 where an echo revealed reduced EF 45% in the setting of admission after a CVA. He had a subsequent hospitalization that month for acute heart failure with an echo on 05/05/2020 that showed LV thrombus with EF 40-45%. MR cardiac (05/06/20) Normal LV size with moderate hypertrophy and systolic dysfunction ( EF 32%). LGE <50% (subendocardial to the basal and apical lateral walls and mid inferior wall consistent with previous MI). No apical thrombus was seen. He was placed on GDMT, and then had a catheterization 4/22 with DES to mid LAD. Subsequently had another catheterization due to elevated troponin in the setting of tachyrhythmia. That cath (01/2022) showed nonobstructive atherosclerosis within the LAD, chronic occlusion of the second OM with right to left collaterals, and moderate nonobstructive disease within the RCA. He has a hx of recurrent tachyrhythmia/SVT with induced Type II NSTEMI since this 2023 admission. He has had 2 prior hospital presentations this month (no formal 12-lead ECG to confirm SVT, his last confirmed SVT was 02/2016 that was treated with adenosine ). On 5/3 presented to ED with tachyrhythmia and discharged with prn propanol. Subsequently he represented to the ED on 06/14/23 and was admitted for tachyarrhythmia  induced chest pain and NSTEMI (type II with troponin elevation to 361) due to underlying CAD. Admission was complicated by AKI (Cr 2.2 initially, discharged with 1.48) and hypotension (responsive to fluids). At discharge his hypertensive medications were changed including decreased Coreg  from 25 mg twice daily to 6.25 mg twice daily.  Discontinued amlodipine , hydralazine , and as needed propranolol . He was continued on ASA 81 mg, Plavix  75 mg, Jardiance  10 mg, Zetia  10 mg, Crestor  40 mg, Entresto  97/103 mg twice daily, spironolactone  25 mg, torsemide  20 mg PRN.   Patient seen yesterday (07/04/23) in cardiology for hospital follow up by Dean Wilson. Patient reported doing well. Since discharge he had 1-2 episodes of palpitations that lasted 15 minutes without other symptoms. Blood pressure in office was elevated 150/100. Multiple blood pressure medication changes since discharge which included Coreg  increased to 25 mg BID and started Hydralazine  25 mg TID by PCP. Amlodipine  5 mg was added at visit. Zio patch was placed on patient.   Patient presented to ED on 07/05/23. He awoke from sleep to palpitations, chest tightness, shortness of breath, and pre-syncope. He arrived to the ED and his ECG showed accelerated junctional rhythm vs AVNRT with HR of 128, and at that time he was hypotensive (systolic 50s). No medical measures were administered, he converted to bradycardic sinus (HR 38) spontaneously. BP improved with this conversion and IV fluids (2L). Troponin (35->773) Cr (2.36).  0421 ECG 1 SVT vs accelerated junctional rate HR 128 0436 ECG 2 sinus bradycarda (HR 38bpm), inferior q waves  0453 ECG 2 sinus HR (66), inferior q waves  Patient  is feeling better now. He states all of his symptoms have resolved.Patient was using stationary bike yesterday for about 30 minutes without chest pain or shortness of breath. He has reduced his caffeine intake, wears his CPAP nightly, and drinks about a gallon of  fluids a day. He denies orthopnea, PND, peripheral edema. Patient has not had a chance to start the amlodipine . Endorses medication compliance.    Past Medical History:  Diagnosis Date   CAD (coronary artery disease)    DES to mid LAD April 2022   Essential hypertension    GERD (gastroesophageal reflux disease)    History of cardiomyopathy    OSA on CPAP    PSVT (paroxysmal supraventricular tachycardia) (HCC)    Stroke [facility])     Past Surgical History:  Procedure Laterality Date   CORONARY STENT INTERVENTION N/A 05/20/2020   Procedure: CORONARY STENT INTERVENTION;  Surgeon: Dean Benne, MD;  Location: [facility] INVASIVE CV LAB;  Service: Cardiovascular;  Laterality: N/A;   LEFT HEART CATH AND CORONARY ANGIOGRAPHY N/A 01/31/2022   Procedure: LEFT HEART CATH AND CORONARY ANGIOGRAPHY;  Surgeon: Dean Lapping, MD;  Location: [facility] INVASIVE CV LAB;  Service: Cardiovascular;  Laterality: N/A;   RIGHT/LEFT HEART CATH AND CORONARY ANGIOGRAPHY N/A 05/20/2020   Procedure: RIGHT/LEFT HEART CATH AND CORONARY ANGIOGRAPHY;  Surgeon: Dean Shade, MD;  Location: [facility] INVASIVE CV LAB;  Service: Cardiovascular;  Laterality: N/A;   UMBILICAL HERNIA REPAIR       Home Medications:  Prior to Admission medications   Medication Sig Start Date End Date Taking? Authorizing Provider  ALPRAZolam (XANAX) 0.5 MG tablet Take 0.5 mg by mouth 2 (two) times daily as needed. 06/19/23  Yes [provider]  amLODipine  (NORVASC ) 5 MG tablet Take 1 tablet (5 mg total) by mouth daily. 07/04/23 10/02/23 Yes Dunlap, Sondra Duran, Wilson-C  ASPIRIN  LOW DOSE 81 MG tablet Take 1 tablet (81 mg total) by mouth daily. 03/20/23  Yes Dean Knight, MD  carvedilol  (COREG ) 25 MG tablet Take 25 mg by mouth 2 (two) times daily. 06/26/23  Yes [provider]  empagliflozin  (JARDIANCE ) 10 MG TABS tablet Take 1 tablet (10 mg total) by mouth daily before breakfast. 03/20/23  Yes Dean Knight, MD  ezetimibe  (ZETIA )  10 MG tablet Take 1 tablet (10 mg total) by mouth daily. 06/10/23 09/08/23 Yes Dean Hummingbird, Wilson-C  fluticasone (FLONASE) 50 MCG/ACT nasal spray Place 1 spray into both nostrils daily as needed for allergies. 05/20/23  Yes [provider]  Multiple Vitamin (MULTIVITAMIN) tablet Take 1 tablet by mouth daily.   Yes [provider]  pantoprazole  (PROTONIX ) 40 MG tablet Take 1 tablet (40 mg total) by mouth daily. 01/26/11 06/09/96 Yes Rehman, Mathews Solomons, MD  rosuvastatin  (CRESTOR ) 40 MG tablet Take 1 tablet (40 mg total) by mouth daily. 03/20/23  Yes Dean Knight, MD  sacubitril -valsartan  (ENTRESTO ) 97-103 MG Take 1 tablet by mouth 2 (two) times daily. 03/20/23  Yes Dean Knight, MD  spironolactone  (ALDACTONE ) 25 MG tablet Take 1 tablet (25 mg total) by mouth daily. 03/20/23  Yes Dean Knight, MD  torsemide  (DEMADEX ) 20 MG tablet Take 1 tablet (20 mg total) by mouth daily as needed. 03/20/23  Yes Dean Knight, MD  azithromycin (ZITHROMAX) 250 MG tablet Take 250 mg by mouth as directed. Patient not taking: Reported on 07/05/2023 07/01/23   [provider]  clopidogrel  (PLAVIX ) 75 MG tablet Take 75 mg by mouth daily. Patient not taking:  Reported on 07/05/2023    [provider]    Scheduled Meds:  Continuous Infusions:  lactated ringers  100 mL/hr at 07/05/23 0708   PRN Meds: acetaminophen  **OR** acetaminophen , ondansetron  (ZOFRAN ) IV  Allergies:    Allergies  Allergen Reactions   Banana Itching   Sunflower Oil Itching    Seeds not oil    Social History:   Social History   Socioeconomic History   Marital status: Married    Spouse name: Dean Wilson   Number of children: 2   Years of education: Not on file   Highest education level: High school graduate  Occupational History   Occupation: Tax inspector    Employer: [employer]  Tobacco Use   Smoking status: Former    Current packs/day: 0.00    Average packs/day: 2.0 packs/day  for 25.0 years (50.0 ttl pk-yrs)    Types: Cigarettes    Start date: 01/26/1983    Quit date: 01/26/2008    Years since quitting: 15.4   Smokeless tobacco: Never  Vaping Use   Vaping status: Never Used  Substance and Sexual Activity   Alcohol use: Never   Drug use: No   Sexual activity: Yes    Birth control/protection: None  Other Topics Concern   Not on file  Social History Narrative   Not on file   Social Drivers of Health   Financial Resource Strain: Low Risk  (05/06/2020)   Overall Financial Resource Strain (CARDIA)    Difficulty of Paying Living Expenses: Not very hard  Food Insecurity: No Food Insecurity (06/15/2023)   Hunger Vital Sign    Worried About Running Out of Food in the Last Year: Never true    Ran Out of Food in the Last Year: Never true  Transportation Needs: No Transportation Needs (06/15/2023)   PRAPARE - Administrator, Civil Service (Medical): No    Lack of Transportation (Non-Medical): No  Physical Activity: Not on file  Stress: Not on file  Social Connections: Not on file  Intimate Partner Violence: Not At Risk (06/16/2023)   Humiliation, Afraid, Rape, and Kick questionnaire    Fear of Current or Ex-Partner: No    Emotionally Abused: No    Physically Abused: No    Sexually Abused: No    Family History:    Family History  Problem Relation Age of Onset   Arrhythmia Mother    Heart attack Father    Arrhythmia Sister        a-fib   Arrhythmia Brother    Anesthesia problems Neg Hx    Hypotension Neg Hx    Malignant hyperthermia Neg Hx    Pseudochol deficiency Neg Hx      ROS:  Please see the history of present illness.   All other ROS reviewed and negative.     Physical Exam/Data: Vitals:   07/05/23 0534 07/05/23 0535 07/05/23 0720 07/05/23 1140  BP: (!) 88/51 92/60 102/68 (!) 151/98  Pulse: 75 81 75 (!) 58  Resp: 14 13 16  (!) 22  Temp:   (!) 97.1 F (36.2 C) 97.9 F (36.6 C)  TempSrc:   Oral Oral  SpO2: 100% 99% 100%  100%  Weight:      Height:       No intake or output data in the 24 hours ending 07/05/23 1157    07/05/2023    4:18 AM 06/16/2023    5:08 AM 06/14/2023    9:00 PM  Last 3 Weights  Weight (lbs) 272 lb 272 lb 7.8 oz 270 lb 8.1 oz  Weight (kg) 123.378 kg 123.6 kg 122.7 kg     Body mass index is 36.89 kg/m.  General:  Well nourished, well developed, in no acute distress HEENT: normal Neck: no JVD Cardiac:  normal S1, S2; RRR; no murmur  Lungs:  clear to auscultation bilaterally, no wheezing, rhonchi or rales  Abd: soft, nontender Ext: no edema Musculoskeletal:  No deformities  Skin: warm and dry  Neuro:  CNs 2-12 intact, no focal abnormalities noted Psych:  Normal affect   EKG:  The EKG was personally reviewed and demonstrates:  Listed above in HPI. Telemetry:  Telemetry was personally reviewed and demonstrates:  accelerated vs avnrt that converts to sinus. Stark HR drop from 128 bpm to 40 bpm with gradual improvement to 50 bpm over next 10-15 minutes.   Relevant CV Studies:  03/20/2023 IMPRESSIONS     1. Left ventricular ejection fraction, by estimation, is 55 to 60%. The  left ventricle has normal function. The left ventricle has no regional  wall motion abnormalities. There is mild left ventricular hypertrophy.  Left ventricular diastolic parameters  are consistent with Grade I diastolic dysfunction (impaired relaxation).  Elevated left atrial pressure.   2. Right ventricular systolic function is normal. The right ventricular  size is normal. Tricuspid regurgitation signal is inadequate for assessing  Wilson pressure.   3. The mitral valve is normal in structure. Trivial mitral valve  regurgitation. No evidence of mitral stenosis.   4. The aortic valve is tricuspid. Aortic valve regurgitation is not  visualized. No aortic stenosis is present.   5. Aortic dilatation noted. 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   Comparison(s): EF 50-55%.  Moderate LVH   LHC 01/31/22  Ost LAD to Prox LAD lesion is 50% stenosed.  Ost Cx to Prox Cx lesion is 30% stenosed.  Mid RCA to Dist RCA lesion is 40% stenosed.  1st Diag lesion is 95% stenosed.  2nd Mrg lesion is 100% stenosed.  Dist RCA lesion is 60% stenosed.  Mid LAD lesion is 50% stenosed.  Previously placed Mid LAD to Dist LAD stent of unknown type is  widely patent.   Multivessel CAD with: Patent left main Proximal LAD ectasia, nonobstructive mid-LAD stenosis, and patency of the LAD stented segment in the mid-distal vessel Chronic occlusion of the second OM branch of the circumflex with right-to-left collaterals Moderate diffuse distal RCA stenosis without significant change from prior cath study, coronary ectasia, and small filling defect in area of ectasia  Cath 05/20/20 Prox LAD lesion is 50% stenosed. Mid LAD lesion is 50% stenosed. Dist LAD lesion is 95% stenosed. A drug-eluting stent was successfully placed using a SYNERGY XD [id number]. Post intervention, there is a 0% residual stenosis.   1. Severe stenosis mid LAD 2. Successful PTCA/DES x 1 mid LAD   Laboratory Data: High Sensitivity Troponin:   Recent Labs  Lab 06/08/23 1344 06/14/23 2107 06/14/23 2303 07/05/23 0435 07/05/23 0649  TROPONINIHS 43* 19* 361* 35* 773*     Chemistry Recent Labs  Lab 07/05/23 0435 07/05/23 0649  NA 139  --   K 4.0  --   CL 107  --   CO2 20*  --   GLUCOSE 128*  --   BUN 32*  --   CREATININE 2.36*  --   CALCIUM  9.2  --   MG  --  1.9  GFRNONAA 30*  --  ANIONGAP 12  --     No results for input(s): "PROT", "ALBUMIN", "AST", "ALT", "ALKPHOS", "BILITOT" in the last 168 hours. Lipids No results for input(s): "CHOL", "TRIG", "HDL", "LABVLDL", "LDLCALC", "CHOLHDL" in the last 168 hours.  Hematology Recent Labs  Lab 07/05/23 0435  WBC 8.8  RBC 5.83*  HGB 16.1  HCT 50.4  MCV 86.4  MCH 27.6  MCHC 31.9  RDW 16.1*  PLT 205   Thyroid   Recent Labs  Lab 07/05/23 0621   TSH 2.047    BNPNo results for input(s): "BNP", "PROBNP" in the last 168 hours.  DDimer No results for input(s): "DDIMER" in the last 168 hours.  Radiology/Studies:  DG Chest Port 1 View Result Date: 07/05/2023 CLINICAL DATA:  63 year old male with chest pain. EXAM: PORTABLE CHEST 1 VIEW COMPARISON:  Portable chest 06/14/2023 and earlier. FINDINGS: Portable AP supine views at 0442 hours. Left chest pacer, resuscitation pads in place. Lung volumes and mediastinal contours are stable and within normal limits. Visualized tracheal air column is within normal limits. Allowing for portable technique the lungs are clear. No pneumothorax or pleural effusion identified on these supine views. No acute osseous abnormality identified. Paucity of bowel gas. IMPRESSION: No acute cardiopulmonary abnormality. Electronically Signed   By: Marlise Simpers M.D.   On: 07/05/2023 05:06     Assessment and Plan: Tachy-Brady arrhythmias (vs vasovagal) H/o SVT Patient represented to ED early 07/05/23 after being awaken from his sleep with chest tightness, shortness of breath, palpitations, and pre-syncope. Initial ECG showed accelerated junctional vs AVNRT. Serial ECG showed within 30 minutes HR variation of 128->> 38->>66 without intervention (vasovagal vs post conversion). On review of the telemetry patient was only briefly at 38 bpm with a gradual improvement over the next 10 minutes to 50 bpm. During episode patient developed hypotension (54/43). He does have a history of prior SVT/tachyrhythmia episodes with associated Type II NSTEMI dating back to 2023 with multiple readmissions.  Patient currently wearing Zio patch that was placed yesterday by outpatient cardiology.  Will ultimately need an ablation, will discuss beta blocker dose with Dr. Carolynne Citron who will see the patient  Hypotensive H/o Hypertension Hypertension in outpatient office yesterday (150/100). He has a history of hypertension on multiple medications. However he  has history of  hypotension during last admission. Current admission initial (54/43). He was given 2L IV fluids in the ER. Most recent BP 150/98, all other blood pressures have been soft.  He endorses medication compliance.   -will hold home medications for now given soft bp will likely be able to resume soon  If he continues to have difficult to control hypertension, recommend renal artery doppler to rule out renal artery stenosis.   CAD  Likely Type II NSTEMI CAD status post DES to the mid LAD in April 2022. 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 Recent NSTEMI 06/14/23, Type II in relation to suspected SVT/tachyrhythmia at that time. This admission patient is endorsing no current chest pain or exertional chest pain. No chest pain outside of tachyrhythmia. He is able to use stationary bike for 30 minutes without symptoms. Troponin (36->>773). No ST changes on current ECG this admission. -troponin elevation likely due to demand ischemia, in setting of tachyarrhythmia, hypotension, and underlying CAD however will continue to trend troponin to peak  HFimEF Last echo 2/25 EF 55-60%  with no focal wall abnormalities, grade 1 diastolic dysfunction. Euvolemic on exam. Home meds include Entresto  97/103 mg BID, Coreg  25 mg BID, spironolactone  25 mg, hydralazine  25 mg TID -will hold home medications in the setting of hypotension  5. AKI on CKD (baseline Cr1.3) Cr currently 2.36. Recent AKI during last admission (2.2)  -home entresto  and spironolactone  on hold, will continue to monitor  6. OSA Continue CPAP    Risk Assessment/Risk Scores:       [place]  Heart Association (NYHA) Functional Class NYHA Class I       For questions or updates, please contact [facility] Please consult  [email or web address] for contact info under    Signed, Mabel Savage, Wilson-C  07/05/2023 11:57 AM  EP Attending  Patient seen and examined. Agree with the findings as documented by Algis Anton, Wilson-C. The patient is a 63 yo man with a h/o remote tobacco abuse, obesity, s/p MI and LV dysfunction who on GDMT had improvement of his LV function. Last heart cath showed patent LAD stent and subtotally occluded OM2. The patient has a h/o SVT dating back to age 14 and his symptoms were initially controlled. The patient was fairly well controlled. Over the last month he has had 3 episodes of SVT with 2 resulting in ED visit. The patient was light headed when he converted this morning from SVT at 130/min back to NSR. He was hypotensive intially and his troponin is increased. He denies anginal symptoms. He denies non-compliance. The episodes of SVT start and stop suddenly. On exam he is a pleasant mobidly obese 63 yo man, NAD. Lungs are clear and CV with a RRR. Ext with no edema. Neuro is non-focal. Tele reveals NSR.  A/P Recurrent SVT - I have recommended catheter ablation and reviewed the risks/benefits. If stable he can be discharged home tomorrow and we will get him on our schedule as an outpatient. Switch coreg  to toprol 50 mg daily.   CAD - I would not work this up as he has no exertional symptoms and the troponin elevation due to SVT.   Pete Brand Keldan Eplin,MD  Addendum: I have tentatively put the patient on the schedule for SVT ablation with me, 07/16/23.

## 2023-07-05 NOTE — TOC CM/SW Note (Signed)
 Transition of Care [facility]) - Inpatient Brief Assessment   Patient Details  Name: Dean Wilson MRN: [id number] Date of Birth: April 26, 1960  Transition of Care [facility] CM/SW Contact:    Juliane Och, LCSW Phone Number: 07/05/2023, 3:26 PM   Clinical Narrative:  3:26 PM Per chart review, patient resides at home. Patient has a PCP and insurance. Patient does not have SNF/HH/DME history. Patient does not have SDOH needs as of May 10th, 2025. No TOC neds were identified at this time. TOC will continue to follow and be available to assist.  Transition of Care Asessment: Insurance and Status: Insurance coverage has been reviewed Patient has primary care physician: Yes Home environment has been reviewed: Private Residence Prior level of function:: N/A Prior/Current Home Services: No current home services Social Drivers of Health Review: SDOH reviewed no interventions necessary (As of May 10th, 2025. SDOH needs updating for current hospitalization.) Readmission risk has been reviewed: Yes Transition of care needs: no transition of care needs at this time

## 2023-07-05 NOTE — ED Triage Notes (Signed)
 Complaining of chest pain to the left side of the chest. Started about 2.5 hours ago. Feels like heart is racing.

## 2023-07-06 DIAGNOSIS — I471 Supraventricular tachycardia, unspecified: Secondary | ICD-10-CM | POA: Diagnosis not present

## 2023-07-06 DIAGNOSIS — I259 Chronic ischemic heart disease, unspecified: Secondary | ICD-10-CM | POA: Diagnosis not present

## 2023-07-06 LAB — CBC
HCT: 43.9 % (ref 39.0–52.0)
Hemoglobin: 14.4 g/dL (ref 13.0–17.0)
MCH: 28 pg (ref 26.0–34.0)
MCHC: 32.8 g/dL (ref 30.0–36.0)
MCV: 85.4 fL (ref 80.0–100.0)
Platelets: 136 10*3/uL — ABNORMAL LOW (ref 150–400)
RBC: 5.14 MIL/uL (ref 4.22–5.81)
RDW: 15.9 % — ABNORMAL HIGH (ref 11.5–15.5)
WBC: 5.8 10*3/uL (ref 4.0–10.5)
nRBC: 0 % (ref 0.0–0.2)

## 2023-07-06 LAB — BASIC METABOLIC PANEL WITH GFR
Anion gap: 9 (ref 5–15)
BUN: 23 mg/dL (ref 8–23)
CO2: 23 mmol/L (ref 22–32)
Calcium: 8.7 mg/dL — ABNORMAL LOW (ref 8.9–10.3)
Chloride: 106 mmol/L (ref 98–111)
Creatinine, Ser: 1.31 mg/dL — ABNORMAL HIGH (ref 0.61–1.24)
GFR, Estimated: >60 mL/min (ref >60)
Glucose, Bld: 160 mg/dL — ABNORMAL HIGH (ref 70–99)
Potassium: 3.6 mmol/L (ref 3.5–5.1)
Sodium: 138 mmol/L (ref 135–145)

## 2023-07-06 LAB — PHOSPHORUS: Phosphorus: 2.3 mg/dL — ABNORMAL LOW (ref 2.5–4.6)

## 2023-07-06 LAB — MAGNESIUM: Magnesium: 1.9 mg/dL (ref 1.7–2.4)

## 2023-07-06 MED ORDER — METOPROLOL TARTRATE 5 MG/5ML IV SOLN: 2.5000 mg | Freq: Once | INTRAVENOUS | Status: AC

## 2023-07-06 MED ORDER — METOPROLOL TARTRATE 5 MG/5ML IV SOLN
INTRAVENOUS | Status: AC
  Administered 2023-07-06: 2.5 mg via INTRAVENOUS
  Filled 2023-07-06: qty 5

## 2023-07-06 MED ORDER — METOPROLOL TARTRATE 25 MG PO TABS
25.0000 mg | ORAL_TABLET | Freq: Four times a day (QID) | ORAL | Status: DC
  Administered 2023-07-06 – 2023-07-07 (×2): 25 mg via ORAL
  Filled 2023-07-06 (×2): qty 1

## 2023-07-06 MED ORDER — ENOXAPARIN SODIUM 40 MG/0.4ML IJ SOSY
40.0000 mg | PREFILLED_SYRINGE | Freq: Every evening | INTRAMUSCULAR | Status: DC
  Administered 2023-07-07: 40 mg via SUBCUTANEOUS
  Filled 2023-07-06 (×2): qty 0.4

## 2023-07-06 NOTE — Plan of Care (Signed)

## 2023-07-06 NOTE — Plan of Care (Signed)
  Problem: Clinical Measurements: Goal: Will remain free from infection Outcome: Progressing   Problem: Activity: Goal: Risk for activity intolerance will decrease Outcome: Progressing   Problem: Nutrition: Goal: Adequate nutrition will be maintained Outcome: Progressing   Problem: Coping: Goal: Level of anxiety will decrease Outcome: Progressing   Problem: Elimination: Goal: Will not experience complications related to bowel motility Outcome: Progressing Goal: Will not experience complications related to urinary retention Outcome: Progressing   Problem: Pain Managment: Goal: General experience of comfort will improve and/or be controlled Outcome: Progressing   Problem: Safety: Goal: Ability to remain free from injury will improve Outcome: Progressing

## 2023-07-06 NOTE — Progress Notes (Signed)
 Rounding Note   Patient Name: Dean Wilson Date of Encounter: 07/06/2023  [facility] Cardiologist: Teddie Favre, MD   Subjective Palpitations this AM  Scheduled Meds:  aspirin  EC  81 mg Oral Daily   ezetimibe   10 mg Oral Daily   pantoprazole   40 mg Oral Daily   rosuvastatin   40 mg Oral Daily   Continuous Infusions:  PRN Meds: acetaminophen  **OR** acetaminophen , ALPRAZolam , ondansetron  (ZOFRAN ) IV   Vital Signs  Vitals:   07/05/23 1545 07/05/23 1921 07/06/23 0018 07/06/23 0358  BP: 132/77 132/78 133/80 (!) 142/83  Pulse: 60 60 69 60  Resp: 19 20 19 18   Temp: 98.1 F (36.7 C) 97.9 F (36.6 C) 97.6 F (36.4 C) 97.6 F (36.4 C)  TempSrc: Oral Oral Oral Oral  SpO2: 96% 94% 96% 94%  Weight:    124.4 kg  Height:        Intake/Output Summary (Last 24 hours) at 07/06/2023 0734 Last data filed at 07/05/2023 1700 Gross per 24 hour  Intake 1275 ml  Output 300 ml  Net 975 ml      07/06/2023    3:58 AM 07/05/2023    4:18 AM 06/16/2023    5:08 AM  Last 3 Weights  Weight (lbs) 274 lb 4 oz 272 lb 272 lb 7.8 oz  Weight (kg) 124.4 kg 123.378 kg 123.6 kg      Telemetry SVT - Personally Reviewed  ECG  N/a - Personally Reviewed  Physical Exam  GEN: No acute distress.   Neck: No JVD Cardiac: RRR, no murmurs, rubs, or gallops.  Respiratory: Clear to auscultation bilaterally. GI: Soft, nontender, non-distended  MS: No edema; No deformity. Neuro:  Nonfocal  Psych: Normal affect   Labs High Sensitivity Troponin:   Recent Labs  Lab 06/08/23 1344 06/14/23 2107 06/14/23 2303 07/05/23 0435 07/05/23 0649  TROPONINIHS 43* 19* 361* 35* 773*     Chemistry Recent Labs  Lab 07/05/23 0435 07/05/23 0649  NA 139  --   K 4.0  --   CL 107  --   CO2 20*  --   GLUCOSE 128*  --   BUN 32*  --   CREATININE 2.36*  --   CALCIUM  9.2  --   MG  --  1.9  GFRNONAA 30*  --   ANIONGAP 12  --     Lipids No results for input(s): "CHOL", "TRIG", "HDL",  "LABVLDL", "LDLCALC", "CHOLHDL" in the last 168 hours.  Hematology Recent Labs  Lab 07/05/23 0435  WBC 8.8  RBC 5.83*  HGB 16.1  HCT 50.4  MCV 86.4  MCH 27.6  MCHC 31.9  RDW 16.1*  PLT 205   Thyroid   Recent Labs  Lab 07/05/23 0621  TSH 2.047    BNP Recent Labs  Lab 07/05/23 0435  BNP 376.7*    DDimer No results for input(s): "DDIMER" in the last 168 hours.   Radiology  DG Chest Port 1 View Result Date: 07/05/2023 CLINICAL DATA:  63 year old male with chest pain. EXAM: PORTABLE CHEST 1 VIEW COMPARISON:  Portable chest 06/14/2023 and earlier. FINDINGS: Portable AP supine views at 0442 hours. Left chest pacer, resuscitation pads in place. Lung volumes and mediastinal contours are stable and within normal limits. Visualized tracheal air column is within normal limits. Allowing for portable technique the lungs are clear. No pneumothorax or pleural effusion identified on these supine views. No acute osseous abnormality identified. Paucity of bowel gas. IMPRESSION: No acute cardiopulmonary abnormality. Electronically Signed  By: Marlise Simpers M.D.   On: 07/05/2023 05:06    Cardiac Studies   Patient Profile   Dean Wilson is a 63 y.o. 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 03/2023: 55-60%), SVT, CVA, CKD, HTN, and OSA on CPAP who is being seen 07/05/2023 for the evaluation of arrhthymias at the request of hospital medicine.   Assessment & Plan  1.SVT - admitted with palpitatoins - found to be in SVT, self converted. Has had some intermittent bradycardia as well - seen by EP Dr Carolynne Citron yesterday, plans for outpatient ablation. Recs to increase coreg  to 50mg  bid.  - coreg  currently held it appears  -recurrent SVT this morning. Beta blocker has been on hold due to low bp's that are resolved - dose IV lopressor 2.5mg . With labile bp and HRs will start lopressor 25mg  every 6 hours with hold parameters to allow more versatility with dosing.  Follow rates and bp's  2. CAD - CAD status post DES to the mid LAD in April 2022. 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 This admission patient is endorsing no current chest pain or exertional chest pain.  - elevated trop this admission secondary to SVT and tachycardia  3. Labile bp - on mulitple meds at home for HTN - intermittent issues with low bp's this admission, received IVFs.  - home bp meds on hold.  - bps improved. Restarting his coreg  for now 25mg  bid.  - significant AKI, perhaps was dry    5. HFimpEF - Last echo 2/25 EF 55-60% with no focal wall abnormalities, grade 1 diastolic dysfunction. Euvolemic on exam  - home meds on hold given hypotension - restarting lopressor as opposed to coreg  for shorter acting effects given labile hemodynamics, consolidate to toprol once rates are controlled.  -continue to hold other HF meds and monitor bp  6. AKI - repeat labs   For questions or updates, please contact [facility] Please consult [email or web address] for contact info under     Signed, Armida Lander, MD  07/06/2023, 7:34 AM

## 2023-07-06 NOTE — Progress Notes (Signed)
 [facility] Progress Note  Patient: Dean Wilson    NFA:[id number]  DOA: 07/05/2023     Date of Service: the patient was seen and examined on 07/06/2023  Chief Complaint  Patient presents with   Chest Pain   Brief hospital course: Dean Wilson is a 63 y.o. 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 03/2023: 55-60%), SVT, CVA, CKD, HTN, and OSA on CPAP p/w chest tightness.  Chest pain started 5:30 in the morning and patient noticed tachycardia heart rate in 130s and BP 120/80 which typically runs high, so EMS was called.   Of note, "[pt] has a hx of recurrent tachyrhythmia/SVT with induced Type II NSTEMI since this 2023 admission. He has had 2 prior hospital presentations this month (no formal 12-lead ECG to confirm SVT, his last confirmed SVT was 02/2016 that was treated with adenosine ). On 5/3 presented to ED with tachyrhythmia and discharged with prn propanol. Subsequently he represented to the ED on 06/14/23 and was admitted for tachyarrhythmia induced chest pain and NSTEMI (type II with troponin elevation to 361) due to underlying CAD. Admission was complicated by AKI (Cr 2.2 initially, discharged with 1.48) and hypotension (responsive to fluids)."   In the ED, pt presented bradycardic and hypotensive on RA. Labs notable for BUN/Cr 32/2.36 (baseline 1.5-1.7), and troponin 35-->773. ECG showed accelerated juinctional rhythm followed by bradycardia. Pt admitted to medicine for ongoing care with cards following.    Assessment and Plan:  # SVT Patient was found to have SVT, self converted.  Patient had intermittent bradycardia. Seen by EP cards, plan is for outpatient ablation, recommended Coreg  50 mg p.o. twice daily Patient had recurrent SVT in the morning on 5/31, blood pressure was soft.  Lopressor 2.5 mg IV one-time dose given followed by Lopressor 25 mg p.o. 6 hourly with hold parameters was started by  cardiology. 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 This admission patient is endorsing no current chest pain or exertional chest pain.  - elevated trop this admission secondary to SVT and tachycardia Continue aspirin  and statin  # Labile blood pressure Held home meds for now Cardio started Lopressor 25 mg p.o. every 6 hourly, continue to monitor BP and need to consolidate Lopressor once blood pressure improves    # HFimpEF - Last echo 2/25 EF 55-60% with no focal wall abnormalities, grade 1 diastolic dysfunction. 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 Mildly low phosphorus level, continue to monitor    # H/o anxiety -PTA Xanax  0.5mg  at bedtime prn to prevent withdrawal seizures     Body mass index is 37.2 kg/m.  Interventions:  Diet: 2 g sodium diet DVT Prophylaxis: Subcutaneous Lovenox   Advance goals of care discussion: Full code  Family Communication: family was present at bedside, at the time of interview.  The pt provided permission to discuss medical plan with the family. Opportunity was given to ask question and all questions were answered  satisfactorily.   Disposition:  Pt is from Home, admitted with SVTs, AKI, labile blood pressure, still has risk of activities and soft blood pressure, which precludes a safe discharge. Discharge to home, when stable and cleared by cardiology most likely in 1 to 2 days.  Subjective: No significant events overnight, sitting comfortably on the recliner, no chest pain or repressing, denied any complaints.  Physical Exam: General: NAD, lying comfortably Appear in no distress, affect appropriate Eyes: PERRLA ENT: Oral Mucosa Clear, moist  Neck: no JVD,  Cardiovascular: S1 and S2 Present, no Murmur,  Respiratory: good respiratory effort, Bilateral Air entry equal and Decreased, no Crackles, no wheezes Abdomen: Bowel Sound present, Soft and no tenderness,  Skin: no rashes Extremities: no Pedal edema, no calf tenderness Neurologic: without any new focal findings Gait not checked due to patient safety concerns  Vitals:   07/06/23 0018 07/06/23 0358 07/06/23 0806 07/06/23 0900  BP: 133/80 (!) 142/83 (!) 127/101 116/81  Pulse: 69 60 (!) 129 69  Resp: 19 18 19 11   Temp: 97.6 F (36.4 C) 97.6 F (36.4 C) 97.9 F (36.6 C)   TempSrc: Oral Oral    SpO2: 96% 94% 94% 94%  Weight:  124.4 kg    Height:        Intake/Output Summary (Last 24 hours) at 07/06/2023 1421 Last data filed at 07/05/2023 1700 Gross per 24 hour  Intake 1275 ml  Output 300 ml  Net 975 ml   Filed Weights   07/05/23 0418 07/06/23 0358  Weight: 123.4 kg 124.4 kg    Data Reviewed: I have personally reviewed and interpreted daily labs, tele strips, imagings as discussed above. I reviewed all nursing notes, pharmacy notes, vitals, pertinent old records I have discussed plan of care as described above with RN and patient/family.  CBC: Recent Labs  Lab 07/05/23 0435 07/06/23 0903  WBC 8.8 5.8  HGB 16.1 14.4  HCT 50.4 43.9  MCV 86.4 85.4  PLT 205 136*   Basic Metabolic Panel: Recent Labs  Lab 07/05/23 0435  07/05/23 0649 07/06/23 0903  NA 139  --  138  K 4.0  --  3.6  CL 107  --  106  CO2 20*  --  23  GLUCOSE 128*  --  160*  BUN 32*  --  23  CREATININE 2.36*  --  1.31*  CALCIUM  9.2  --  8.7*  MG  --  1.9 1.9  PHOS  --   --  2.3*    Studies: No results found.  Scheduled Meds:  aspirin  EC  81 mg Oral Daily   ezetimibe   10 mg Oral Daily   metoprolol tartrate  25 mg Oral Q6H   pantoprazole   40 mg Oral Daily   rosuvastatin   40 mg Oral Daily   Continuous Infusions: PRN Meds: acetaminophen  **OR** acetaminophen , ALPRAZolam , ondansetron  (ZOFRAN ) IV  Time spent: 35 minutes  Author: Althia Atlas. MD [facility] 07/06/2023 2:21 PM  To reach On-call, see care teams to locate the attending and reach out to them via [email or web address]. If 7PM-7AM, please contact night-coverage If you still have difficulty reaching the attending provider, please page the [facility] (Director on Call) for [facility] on amion for assistance.

## 2023-07-06 NOTE — Progress Notes (Signed)
 Pt was previously placed on C-PAP that was brought from home. Pt's O2 saturations continued to decrease into the low 80's. Notified RT, 4L supplemental O2 added to pt's home C-PAP machine, & elevated HOB. Pt's O2 saturations increased ot 94% & resting comfortably. Will continue to monitor.  Sonjia Durie, RN

## 2023-07-07 DIAGNOSIS — I259 Chronic ischemic heart disease, unspecified: Secondary | ICD-10-CM | POA: Diagnosis not present

## 2023-07-07 DIAGNOSIS — I471 Supraventricular tachycardia, unspecified: Secondary | ICD-10-CM | POA: Diagnosis not present

## 2023-07-07 LAB — BASIC METABOLIC PANEL WITH GFR
Anion gap: 7 (ref 5–15)
BUN: 22 mg/dL (ref 8–23)
CO2: 24 mmol/L (ref 22–32)
Calcium: 8.6 mg/dL — ABNORMAL LOW (ref 8.9–10.3)
Chloride: 106 mmol/L (ref 98–111)
Creatinine, Ser: 1.28 mg/dL — ABNORMAL HIGH (ref 0.61–1.24)
GFR, Estimated: >60 mL/min (ref >60)
Glucose, Bld: 144 mg/dL — ABNORMAL HIGH (ref 70–99)
Potassium: 3.4 mmol/L — ABNORMAL LOW (ref 3.5–5.1)
Sodium: 137 mmol/L (ref 135–145)

## 2023-07-07 LAB — VITAMIN B12: Vitamin B-12: 355 pg/mL (ref 180–914)

## 2023-07-07 LAB — VITAMIN D 25 HYDROXY (VIT D DEFICIENCY, FRACTURES): Vit D, 25-Hydroxy: 32.75 ng/mL (ref 30–100)

## 2023-07-07 LAB — PHOSPHORUS: Phosphorus: 3 mg/dL (ref 2.5–4.6)

## 2023-07-07 LAB — MAGNESIUM: Magnesium: 2 mg/dL (ref 1.7–2.4)

## 2023-07-07 MED ORDER — SACUBITRIL-VALSARTAN 24-26 MG PO TABS
1.0000 | ORAL_TABLET | Freq: Once | ORAL | Status: AC
  Administered 2023-07-07: 1 via ORAL
  Filled 2023-07-07: qty 1

## 2023-07-07 MED ORDER — HYDRALAZINE HCL 50 MG PO TABS
50.0000 mg | ORAL_TABLET | Freq: Four times a day (QID) | ORAL | Status: DC | PRN
  Administered 2023-07-07 – 2023-07-08 (×2): 50 mg via ORAL
  Filled 2023-07-07 (×2): qty 1

## 2023-07-07 MED ORDER — SACUBITRIL-VALSARTAN 24-26 MG PO TABS
1.0000 | ORAL_TABLET | Freq: Two times a day (BID) | ORAL | Status: DC
  Administered 2023-07-07: 1 via ORAL
  Filled 2023-07-07: qty 1

## 2023-07-07 MED ORDER — POTASSIUM CHLORIDE CRYS ER 20 MEQ PO TBCR
40.0000 meq | EXTENDED_RELEASE_TABLET | Freq: Once | ORAL | Status: AC
  Administered 2023-07-07: 40 meq via ORAL
  Filled 2023-07-07: qty 2

## 2023-07-07 MED ORDER — HYDRALAZINE HCL 20 MG/ML IJ SOLN
10.0000 mg | Freq: Four times a day (QID) | INTRAMUSCULAR | Status: DC | PRN
  Administered 2023-07-07: 10 mg via INTRAVENOUS
  Filled 2023-07-07: qty 1

## 2023-07-07 MED ORDER — SACUBITRIL-VALSARTAN 49-51 MG PO TABS
1.0000 | ORAL_TABLET | Freq: Two times a day (BID) | ORAL | Status: DC
  Filled 2023-07-07: qty 1

## 2023-07-07 MED ORDER — SACUBITRIL-VALSARTAN 97-103 MG PO TABS
1.0000 | ORAL_TABLET | Freq: Two times a day (BID) | ORAL | Status: DC
  Administered 2023-07-07 – 2023-07-09 (×4): 1 via ORAL
  Filled 2023-07-07 (×4): qty 1

## 2023-07-07 MED ORDER — METOPROLOL TARTRATE 25 MG PO TABS
37.5000 mg | ORAL_TABLET | Freq: Two times a day (BID) | ORAL | Status: DC
  Administered 2023-07-07 – 2023-07-09 (×5): 37.5 mg via ORAL
  Filled 2023-07-07 (×5): qty 1

## 2023-07-07 MED ORDER — AMLODIPINE BESYLATE 5 MG PO TABS
5.0000 mg | ORAL_TABLET | Freq: Every day | ORAL | Status: DC
  Administered 2023-07-07 – 2023-07-09 (×3): 5 mg via ORAL
  Filled 2023-07-07 (×3): qty 1

## 2023-07-07 MED ORDER — SPIRONOLACTONE 25 MG PO TABS
25.0000 mg | ORAL_TABLET | Freq: Every day | ORAL | Status: DC
  Administered 2023-07-07 – 2023-07-09 (×3): 25 mg via ORAL
  Filled 2023-07-07 (×3): qty 1

## 2023-07-07 MED ORDER — EMPAGLIFLOZIN 10 MG PO TABS
10.0000 mg | ORAL_TABLET | Freq: Every day | ORAL | Status: DC
  Administered 2023-07-07 – 2023-07-09 (×3): 10 mg via ORAL
  Filled 2023-07-07 (×3): qty 1

## 2023-07-07 NOTE — Progress Notes (Signed)
 Rounding Note   Patient Name: Dean Wilson Date of Encounter: 07/07/2023  [facility] Cardiologist: Teddie Favre, MD   Subjective No complaints  Scheduled Meds:  aspirin  EC  81 mg Oral Daily   enoxaparin  (LOVENOX ) injection  40 mg Subcutaneous QPM   ezetimibe   10 mg Oral Daily   metoprolol tartrate  25 mg Oral Q6H   pantoprazole   40 mg Oral Daily   rosuvastatin   40 mg Oral Daily   Continuous Infusions:  PRN Meds: acetaminophen  **OR** acetaminophen , ALPRAZolam , ondansetron  (ZOFRAN ) IV   Vital Signs  Vitals:   07/06/23 1953 07/06/23 2000 07/06/23 2325 07/07/23 0348  BP:  (!) 163/98 (!) 151/70 (!) 142/81  Pulse:  (!) 58 63 (!) 50  Resp:  18 18 20   Temp: 97.6 F (36.4 C)  97.8 F (36.6 C) 97.6 F (36.4 C)  TempSrc: Oral  Oral Oral  SpO2:  92% 96% 98%  Weight:    124.1 kg  Height:        Intake/Output Summary (Last 24 hours) at 07/07/2023 0745 Last data filed at 07/06/2023 2000 Gross per 24 hour  Intake 720 ml  Output --  Net 720 ml      07/07/2023    3:48 AM 07/06/2023    3:58 AM 07/05/2023    4:18 AM  Last 3 Weights  Weight (lbs) 273 lb 9.5 oz 274 lb 4 oz 272 lb  Weight (kg) 124.1 kg 124.4 kg 123.378 kg      Telemetry SR and sinus brady - Personally Reviewed  ECG  N/a - Personally Reviewed  Physical Exam  GEN: No acute distress.   Neck: No JVD Cardiac: RRR, no murmurs, rubs, or gallops.  Respiratory: Clear to auscultation bilaterally. GI: Soft, nontender, non-distended  MS: No edema; No deformity. Neuro:  Nonfocal  Psych: Normal affect   Labs High Sensitivity Troponin:   Recent Labs  Lab 06/08/23 1344 06/14/23 2107 06/14/23 2303 07/05/23 0435 07/05/23 0649  TROPONINIHS 43* 19* 361* 35* 773*     Chemistry Recent Labs  Lab 07/05/23 0435 07/05/23 0649 07/06/23 0903 07/07/23 0226  NA 139  --  138 137  K 4.0  --  3.6 3.4*  CL 107  --  106 106  CO2 20*  --  23 24  GLUCOSE 128*  --  160* 144*  BUN 32*  --  23 22   CREATININE 2.36*  --  1.31* 1.28*  CALCIUM  9.2  --  8.7* 8.6*  MG  --  1.9 1.9  --   GFRNONAA 30*  --  >60 >60  ANIONGAP 12  --  9 7    Lipids No results for input(s): "CHOL", "TRIG", "HDL", "LABVLDL", "LDLCALC", "CHOLHDL" in the last 168 hours.  Hematology Recent Labs  Lab 07/05/23 0435 07/06/23 0903  WBC 8.8 5.8  RBC 5.83* 5.14  HGB 16.1 14.4  HCT 50.4 43.9  MCV 86.4 85.4  MCH 27.6 28.0  MCHC 31.9 32.8  RDW 16.1* 15.9*  PLT 205 136*   Thyroid   Recent Labs  Lab 07/05/23 0621  TSH 2.047    BNP Recent Labs  Lab 07/05/23 0435  BNP 376.7*    DDimer No results for input(s): "DDIMER" in the last 168 hours.   Radiology  No results found.  Cardiac Studies   Patient Profile   Dean Wilson is a 63 y.o. 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 03/2023: 55-60%),  SVT, CVA, CKD, HTN, and OSA on CPAP who is being seen 07/05/2023 for the evaluation of arrhthymias at the request of hospital medicine.   Assessment & Plan  1.SVT - admitted with palpitatoins - found to be in SVT, self converted. Has had some intermittent bradycardia as well - seen by EP Dr Carolynne Citron yesterday, plans for outpatient ablation. Recs to increase coreg  to 50mg  bid.     -recurrent SVT Saturday morning. Beta blocker had been on hold due to low bp's that are resolved - started lopressor 25mg  every 6 hours yesterday with hold parameters for more verstaility in dosing/holding doses.  - tele shows back in SR this morning.BP's tolerating lopressor. Did have 2 held doses due to HRs in the 21s - adjust lopressor to 37.5mg  bid, follow rates after AM dose.  -would discharge on lopressor 37.5mg  bid, can take additional 25mg  prn palpitatons -from EP note tenative plans for outpatient ablation 07/16/23    2. CAD - CAD status post DES to the mid LAD in April 2022. 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 This admission patient is endorsing no current chest pain or exertional chest pain.  - elevated trop this admission secondary to SVT and tachycardia   3. Labile bp - on mulitple meds at home for HTN - intermittent issues with low bp's this admission, received IVFs.  - home bp meds on hold. - significant AKI, perhaps was dry - restart entresto  but lower dose 24/26mg  bid.  - remain off norvasc  for now and aldactone .          5. HFimpEF - Last echo 2/25 EF 55-60% with no focal wall abnormalities, grade 1 diastolic dysfunction. Euvolemic on exam  - home meds on hold given hypotension - restarting lopressor as opposed to coreg  for shorter acting effects given labile hemodynamics, consolidate to toprol once rates are controlled. Given some labile HR's would stick with short acting lopressor for now, change to 37.5mg  bid.  -bp's improved after IVFs, start entresto  at lower dose 24/26mg  bid.     6. AKI - resolved with IVFs, looked to have been prerenal  Monitor rates into early afternoon, if does well ok for discharge.     For questions or updates, please contact [facility] Please consult [email or web address] for contact info under     Signed, Armida Lander, MD  07/07/2023, 7:45 AM

## 2023-07-07 NOTE — Progress Notes (Signed)
 [facility] Progress Note  Patient: Dean Wilson    HQI:[id number]  DOA: 07/05/2023     Date of Service: the patient was seen and examined on 07/07/2023  Chief Complaint  Patient presents with   Chest Pain   Brief hospital course: JOSTEN WARMUTH is a 63 y.o. 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 03/2023: 55-60%), SVT, CVA, CKD, HTN, and OSA on CPAP p/w chest tightness.  Chest pain started 5:30 in the morning and patient noticed tachycardia heart rate in 130s and BP 120/80 which typically runs high, so EMS was called.   Of note, "[pt] has a hx of recurrent tachyrhythmia/SVT with induced Type II NSTEMI since this 2023 admission. He has had 2 prior hospital presentations this month (no formal 12-lead ECG to confirm SVT, his last confirmed SVT was 02/2016 that was treated with adenosine ). On 5/3 presented to ED with tachyrhythmia and discharged with prn propanol. Subsequently he represented to the ED on 06/14/23 and was admitted for tachyarrhythmia induced chest pain and NSTEMI (type II with troponin elevation to 361) due to underlying CAD. Admission was complicated by AKI (Cr 2.2 initially, discharged with 1.48) and hypotension (responsive to fluids)."   In the ED, pt presented bradycardic and hypotensive on RA. Labs notable for BUN/Cr 32/2.36 (baseline 1.5-1.7), and troponin 35-->773. ECG showed accelerated juinctional rhythm followed by bradycardia. Pt admitted to medicine for ongoing care with cards following.    Assessment and Plan:  # SVT Patient was found to have SVT, self converted.  Patient had intermittent bradycardia. Seen by EP cards, plan is for outpatient ablation, recommended Coreg  50 mg p.o. twice daily Patient had recurrent SVT in the morning on 5/31, blood pressure was soft.  S/p Lopressor 2.5 mg IV x 1 dose, followed by Lopressor 25 mg p.o. 6 hourly with hold parameters was started by  cardiology. 6/1 consolidated Lopressor 37.5 mg p.o. 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 This admission patient is endorsing no current chest pain or exertional chest pain.  - elevated trop this admission secondary to SVT and tachycardia Continue aspirin  and statin  # Labile blood pressure Home medications were held on admission due to soft blood pressure.  S/p Lopressor as above.  Started Lopressor 37.5 mg p.o. twice daily as per cardiology 6/1 hypertensive urgency, uncontrolled BP.  Resumed home meds amlodipine  5 mg p.o. daily, Entresto  97-103 mg p.o. twice daily home dose.  Spironolactone  25 mg p.o. daily and Jardiance  10 mg p.o. daily Still blood pressure is very high, started on hydralazine  IV/p.o. prn We will continue to monitor BP and titrate dose accordingly  # HFimpEF - Last echo 2/25 EF 55-60% with no focal wall abnormalities, grade 1 diastolic dysfunction. 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 Mildly low phosphorus level, continue to monitor    # H/o anxiety -PTA Xanax  0.5mg  at bedtime prn to prevent withdrawal seizures     Body mass  index is 37.11 kg/m.  Interventions:  Diet: 2 g sodium diet DVT Prophylaxis: Subcutaneous Lovenox    Advance goals of care discussion: Full code  Family Communication: family was present at bedside, at the time of interview.  The pt provided permission to discuss medical plan with the family. Opportunity was given to ask question and all questions were answered satisfactorily.   Disposition:  Pt is from Home, admitted with SVTs, AKI, labile blood pressure, still has risk of activities and soft blood pressure, which precludes a safe discharge. Discharge to home, when stable and cleared by cardiology most likely in 1 to 2 days. 6/1 Due to hypertensive urgency, patient is not stable to DC today.  Patient will be discharged tomorrow a.m. if remains stable   Subjective: No significant events overnight, overall patient feels improvement, denied any chest pain or palpitation, no shortness of breath.  Patient is tolerating beta-blocker well.  Physical Exam: General: NAD, lying comfortably Appear in no distress, affect appropriate Eyes: PERRLA ENT: Oral Mucosa Clear, moist  Neck: no JVD,  Cardiovascular: S1 and S2 Present, no Murmur,  Respiratory: good respiratory effort, Bilateral Air entry equal and Decreased, no Crackles, no wheezes Abdomen: Bowel Sound present, Soft and no tenderness,  Skin: no rashes Extremities: no Pedal edema, no calf tenderness Neurologic: without any new focal findings Gait not checked due to patient safety concerns  Vitals:   07/07/23 1218 07/07/23 1255 07/07/23 1420 07/07/23 1554  BP: (!) 195/96 (!) 172/83 (!) 190/103 (!) 199/99  Pulse: (!) 57     Resp: 18     Temp: 98.6 F (37 C)     TempSrc: Oral     SpO2: 95%     Weight:      Height:        Intake/Output Summary (Last 24 hours) at 07/07/2023 1630 Last data filed at 07/07/2023 1255 Gross per 24 hour  Intake 960 ml  Output --  Net 960 ml   Filed Weights   07/05/23 0418 07/06/23 0358 07/07/23 0348   Weight: 123.4 kg 124.4 kg 124.1 kg    Data Reviewed: I have personally reviewed and interpreted daily labs, tele strips, imagings as discussed above. I reviewed all nursing notes, pharmacy notes, vitals, pertinent old records I have discussed plan of care as described above with RN and patient/family.  CBC: Recent Labs  Lab 07/05/23 0435 07/06/23 0903  WBC 8.8 5.8  HGB 16.1 14.4  HCT 50.4 43.9  MCV 86.4 85.4  PLT 205 136*   Basic Metabolic Panel: Recent Labs  Lab 07/05/23 0435 07/05/23 0649 07/06/23 0903 07/07/23 0226 07/07/23 1007  NA 139  --  138 137  --   K 4.0  --  3.6 3.4*  --   CL 107  --  106 106  --   CO2 20*  --  23 24  --   GLUCOSE 128*  --  160* 144*  --   BUN 32*  --  23 22  --   CREATININE 2.36*  --  1.31* 1.28*  --   CALCIUM  9.2  --  8.7* 8.6*  --   MG  --  1.9 1.9  --  2.0  PHOS  --   --  2.3*  --  3.0    Studies: No results found.  Scheduled Meds:  amLODipine   5 mg Oral Daily   aspirin  EC  81 mg Oral Daily   empagliflozin   10 mg Oral QAC breakfast   enoxaparin  (LOVENOX ) injection  40 mg Subcutaneous QPM   ezetimibe   10 mg Oral Daily   metoprolol tartrate  37.5 mg Oral BID   pantoprazole   40 mg Oral Daily   rosuvastatin   40 mg Oral Daily   sacubitril -valsartan   1 tablet Oral BID   spironolactone   25 mg Oral Daily   Continuous Infusions: PRN Meds: acetaminophen  **OR** acetaminophen , ALPRAZolam , hydrALAZINE  **OR** hydrALAZINE , ondansetron  (ZOFRAN ) IV  Time spent: 55 minutes  Author: Althia Atlas. MD [facility] 07/07/2023 4:30 PM  To reach On-call, see care teams to locate the attending and reach out to them via [email or web address]. If 7PM-7AM, please contact night-coverage If you still have difficulty reaching the attending provider, please page the [facility] (Director on Call) for [facility] on amion for assistance.

## 2023-07-08 ENCOUNTER — Other Ambulatory Visit: Payer: Self-pay

## 2023-07-08 ENCOUNTER — Other Ambulatory Visit (HOSPITAL_COMMUNITY): Payer: Self-pay

## 2023-07-08 ENCOUNTER — Telehealth: Payer: Self-pay | Admitting: Cardiology

## 2023-07-08 ENCOUNTER — Telehealth (HOSPITAL_COMMUNITY): Payer: Self-pay | Admitting: Pharmacy Technician

## 2023-07-08 DIAGNOSIS — Z8673 Personal history of transient ischemic attack (TIA), and cerebral infarction without residual deficits: Secondary | ICD-10-CM | POA: Diagnosis not present

## 2023-07-08 DIAGNOSIS — I5032 Chronic diastolic (congestive) heart failure: Secondary | ICD-10-CM | POA: Diagnosis not present

## 2023-07-08 DIAGNOSIS — I214 Non-ST elevation (NSTEMI) myocardial infarction: Secondary | ICD-10-CM | POA: Diagnosis not present

## 2023-07-08 DIAGNOSIS — I480 Paroxysmal atrial fibrillation: Secondary | ICD-10-CM | POA: Diagnosis not present

## 2023-07-08 DIAGNOSIS — I4891 Unspecified atrial fibrillation: Secondary | ICD-10-CM | POA: Diagnosis not present

## 2023-07-08 DIAGNOSIS — I259 Chronic ischemic heart disease, unspecified: Secondary | ICD-10-CM | POA: Diagnosis not present

## 2023-07-08 DIAGNOSIS — I2510 Atherosclerotic heart disease of native coronary artery without angina pectoris: Secondary | ICD-10-CM | POA: Diagnosis not present

## 2023-07-08 LAB — CBC
HCT: 46.2 % (ref 39.0–52.0)
Hemoglobin: 14.9 g/dL (ref 13.0–17.0)
MCH: 27.7 pg (ref 26.0–34.0)
MCHC: 32.3 g/dL (ref 30.0–36.0)
MCV: 85.9 fL (ref 80.0–100.0)
Platelets: 159 10*3/uL (ref 150–400)
RBC: 5.38 MIL/uL (ref 4.22–5.81)
RDW: 15.7 % — ABNORMAL HIGH (ref 11.5–15.5)
WBC: 6.4 10*3/uL (ref 4.0–10.5)
nRBC: 0 % (ref 0.0–0.2)

## 2023-07-08 LAB — BASIC METABOLIC PANEL WITH GFR
Anion gap: 9 (ref 5–15)
BUN: 19 mg/dL (ref 8–23)
CO2: 24 mmol/L (ref 22–32)
Calcium: 8.8 mg/dL — ABNORMAL LOW (ref 8.9–10.3)
Chloride: 104 mmol/L (ref 98–111)
Creatinine, Ser: 1.18 mg/dL (ref 0.61–1.24)
GFR, Estimated: >60 mL/min (ref >60)
Glucose, Bld: 100 mg/dL — ABNORMAL HIGH (ref 70–99)
Potassium: 3.6 mmol/L (ref 3.5–5.1)
Sodium: 137 mmol/L (ref 135–145)

## 2023-07-08 LAB — MAGNESIUM: Magnesium: 2 mg/dL (ref 1.7–2.4)

## 2023-07-08 LAB — PHOSPHORUS: Phosphorus: 3.5 mg/dL (ref 2.5–4.6)

## 2023-07-08 MED ORDER — APIXABAN 5 MG PO TABS
5.0000 mg | ORAL_TABLET | Freq: Two times a day (BID) | ORAL | Status: DC
  Administered 2023-07-08 – 2023-07-09 (×3): 5 mg via ORAL
  Filled 2023-07-08 (×3): qty 1

## 2023-07-08 MED ORDER — POTASSIUM CHLORIDE CRYS ER 20 MEQ PO TBCR
40.0000 meq | EXTENDED_RELEASE_TABLET | Freq: Once | ORAL | Status: AC
  Administered 2023-07-08: 40 meq via ORAL
  Filled 2023-07-08: qty 2

## 2023-07-08 MED ORDER — VITAMIN B-12 1000 MCG PO TABS
500.0000 ug | ORAL_TABLET | Freq: Every day | ORAL | Status: DC
  Administered 2023-07-08 – 2023-07-09 (×2): 500 ug via ORAL
  Filled 2023-07-08 (×2): qty 1

## 2023-07-08 NOTE — Telephone Encounter (Signed)
 Patient Product/process development scientist completed.    The patient is insured through [facility]. Patient has [employer], may use a copay card, and/or apply for patient assistance if available.    Ran test claim for Eliquis 5 mg and the current 30 day co-pay is $50.00.   This test claim was processed through [facility]- copay amounts may vary at other pharmacies due to pharmacy/plan contracts, or as the patient moves through the different stages of their insurance plan.     Roland Brigg, CPHT Pharmacy Technician III Certified Patient Advocate [facility] Patient Advocate Team Direct Number: [phone]  Fax: [phone]

## 2023-07-08 NOTE — Progress Notes (Signed)
 Patient went back in sinus rhythm. Obtain EKG and notified Cardiology. No new orders. Will continue to monitor patient.

## 2023-07-08 NOTE — TOC Initial Note (Signed)
 Transition of Care [facility]) - Initial/Assessment Note    Patient Details  Name: Dean Wilson MRN: [id number] Date of Birth: [birth year]  [facility] CM/SW Contact:    Jeani Mill, RN Phone Number: 07/08/2023, 12:55 PM  Clinical Narrative:                  Spoke to patient at bedside regarding transition needs.  Patient is new to eliquis. 10$ copay card given. Patient uses [facility] [address]. Patient has transportation at discharge.  TOC will continue to follow for needs. Expected Discharge Plan: Home/Self Care Barriers to Discharge: Continued Medical Work up   Patient Goals and CMS Choice Patient states their goals for this hospitalization and ongoing recovery are:: return home          Expected Discharge Plan and Services   Discharge Planning Services: Medication Assistance   Living arrangements for the past 2 months: Single Family Home                                      Prior Living Arrangements/Services Living arrangements for the past 2 months: Single Family Home Lives with:: Spouse Patient language and need for interpreter reviewed:: Yes Do you feel safe going back to the place where you live?: Yes      Need for Family Participation in Patient Care: Yes (Comment) Care giver support system in place?: Yes (comment)   Criminal Activity/Legal Involvement Pertinent to Current Situation/Hospitalization: No - Comment as needed  Activities of Daily Living   ADL Screening (condition at time of admission) Independently performs ADLs?: Yes (appropriate for developmental age) Is the patient deaf or have difficulty hearing?: No Does the patient have difficulty seeing, even when wearing glasses/contacts?: No Does the patient have difficulty concentrating, remembering, or making decisions?: No  Permission Sought/Granted                  Emotional Assessment Appearance:: Appears stated age Attitude/Demeanor/Rapport:  Engaged Affect (typically observed): Accepting Orientation: : Oriented to Self, Oriented to Place, Oriented to  Time, Oriented to Situation Alcohol / Substance Use: Not Applicable Psych Involvement: No (comment)  Admission diagnosis:  Tachyarrhythmia [R00.0] AKI (acute kidney injury) (HCC) [N17.9] Chest pain [R07.9] Patient Active Problem List   Diagnosis Date Noted   Hypotension 06/15/2023   Tachyarrhythmia 06/15/2023   AKI (acute kidney injury) (HCC) 06/15/2023   Obesity (BMI 30-39.9) 02/01/2022   OSA (obstructive sleep apnea) 02/01/2022   Stage 3a chronic kidney disease (CKD) (HCC) 02/01/2022   Paroxysmal supraventricular tachycardia (HCC) 01/31/2022   Essential hypertension 01/31/2022   Mixed hyperlipidemia 01/31/2022   NSTEMI (non-ST elevated myocardial infarction) (HCC) 01/31/2022   Chest pain 01/30/2022   History of CVA (cerebrovascular accident) 05/18/2020   Chronic systolic congestive heart failure (HCC) 05/18/2020   Nocturnal hypoxia 05/06/2020   Acute systolic congestive heart failure (HCC) 05/06/2020   Mild mitral regurgitation 05/06/2020   Hypokalemia 05/06/2020   Hypertensive emergency 05/05/2020   Gastritis 01/26/2011   PCP:  Orlena Bitters, MD Pharmacy:   [facility] - [place], [place] - [address] [place]  [address] Phone: [phone] Fax: [phone]  [facility] [phone] - [place], [place] - [address] AT [facility] [address] [place] [address] Phone: [phone] Fax:  [phone]  [facility] [address], [place] - [address] [place] [address] Phone: [phone] Fax: [phone]     Social Drivers of Health (SDOH) Social History: SDOH Screenings   Food Insecurity: No Food Insecurity (07/05/2023)  Housing: Low Risk  (07/05/2023)  Transportation Needs: No Transportation Needs (07/05/2023)  Utilities: Not At Risk (07/05/2023)   Alcohol Screen: Low Risk  (05/06/2020)  Financial Resource Strain: Low Risk  (05/06/2020)  Tobacco Use: Medium Risk (07/05/2023)   SDOH Interventions:     Readmission Risk Interventions     No data to display

## 2023-07-08 NOTE — Telephone Encounter (Signed)
 Pt's wife Adell Hones was calling to let office know that pt has been in hospital since Friday and still there but he is still wearing the heart monitor. She'd like a callback to give update. Please advise

## 2023-07-08 NOTE — Discharge Instructions (Signed)

## 2023-07-08 NOTE — Progress Notes (Signed)
 Rounding Note   Patient Name: Dean Wilson Date of Encounter: 07/08/2023  [facility] Cardiologist: Teddie Favre, MD   Subjective Upset about HR and BP spike last night - he is now in atrial fibrillation which is a new rhythm for him.  Scheduled Meds:  amLODipine   5 mg Oral Daily   aspirin  EC  81 mg Oral Daily   empagliflozin   10 mg Oral QAC breakfast   enoxaparin  (LOVENOX ) injection  40 mg Subcutaneous QPM   ezetimibe   10 mg Oral Daily   metoprolol tartrate  37.5 mg Oral BID   pantoprazole   40 mg Oral Daily   rosuvastatin   40 mg Oral Daily   sacubitril -valsartan   1 tablet Oral BID   spironolactone   25 mg Oral Daily   Continuous Infusions:  PRN Meds: acetaminophen  **OR** acetaminophen , ALPRAZolam , hydrALAZINE  **OR** hydrALAZINE , ondansetron  (ZOFRAN ) IV   Vital Signs  Vitals:   07/08/23 0043 07/08/23 0440 07/08/23 0526 07/08/23 0743  BP:    130/86  Pulse: 63   91  Resp: 16   18  Temp: 97.6 F (36.4 C) 98.1 F (36.7 C)  98.5 F (36.9 C)  TempSrc: Axillary Oral  Oral  SpO2: 97%   93%  Weight:   120.3 kg   Height:        Intake/Output Summary (Last 24 hours) at 07/08/2023 0841 Last data filed at 07/08/2023 0743 Gross per 24 hour  Intake 1080 ml  Output --  Net 1080 ml      07/08/2023    5:26 AM 07/07/2023    3:48 AM 07/06/2023    3:58 AM  Last 3 Weights  Weight (lbs) 265 lb 3.4 oz 273 lb 9.5 oz 274 lb 4 oz  Weight (kg) 120.3 kg 124.1 kg 124.4 kg      Telemetry Atrial fibrillation with variable response - personally reviewed  ECG  Afib at 85 - Personally Reviewed  Physical Exam  GEN: No acute distress.   Neck: No JVD Cardiac: irregularly irregular, no murmurs, rubs, or gallops.  Respiratory: Clear to auscultation bilaterally. GI: Soft, nontender, non-distended  MS: No edema; No deformity. Neuro:  Nonfocal  Psych: Frustrated  Labs High Sensitivity Troponin:   Recent Labs  Lab 06/08/23 1344 06/14/23 2107 06/14/23 2303  07/05/23 0435 07/05/23 0649  TROPONINIHS 43* 19* 361* 35* 773*     Chemistry Recent Labs  Lab 07/06/23 0903 07/07/23 0226 07/07/23 1007 07/08/23 0237  NA 138 137  --  137  K 3.6 3.4*  --  3.6  CL 106 106  --  104  CO2 23 24  --  24  GLUCOSE 160* 144*  --  100*  BUN 23 22  --  19  CREATININE 1.31* 1.28*  --  1.18  CALCIUM  8.7* 8.6*  --  8.8*  MG 1.9  --  2.0 2.0  GFRNONAA >60 >60  --  >60  ANIONGAP 9 7  --  9    Lipids No results for input(s): "CHOL", "TRIG", "HDL", "LABVLDL", "LDLCALC", "CHOLHDL" in the last 168 hours.  Hematology Recent Labs  Lab 07/05/23 0435 07/06/23 0903 07/08/23 0237  WBC 8.8 5.8 6.4  RBC 5.83* 5.14 5.38  HGB 16.1 14.4 14.9  HCT 50.4 43.9 46.2  MCV 86.4 85.4 85.9  MCH 27.6 28.0 27.7  MCHC 31.9 32.8 32.3  RDW 16.1* 15.9* 15.7*  PLT 205 136* 159   Thyroid   Recent Labs  Lab 07/05/23 0621  TSH 2.047    BNP  Recent Labs  Lab 07/05/23 0435  BNP 376.7*    DDimer No results for input(s): "DDIMER" in the last 168 hours.   Radiology  No results found.  Cardiac Studies   Patient Profile   Dean Wilson is a 63 y.o. 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 03/2023: 55-60%), SVT, CVA, CKD, HTN, and OSA on CPAP who is being seen 07/05/2023 for the evaluation of arrhthymias at the request of hospital medicine.   Assessment & Plan  1.SVT - admitted with palpitatoins - found to be in SVT, self converted. Has had some intermittent bradycardia as well - seen by EP Dr Carolynne Citron with plans for SVT ablation, however, now has new onset afib.   2. Atrial fibrillation - This is new onset- his CHADsVASC score is 4 - Start Eliquis 5 mg BID. Stop low dose aspirin . - Will need outpatient follow-up for afib - ?ablation vs pharmacologic strategy   3. CAD - CAD status post DES to the mid LAD in April 2022. 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 This admission patient is endorsing no current chest pain or exertional chest pain.  - elevated trop this admission secondary to SVT and tachycardia   4. Labile bp - on mulitple meds at home for HTN - intermittent issues with low bp's this admission, received IVFs.  - BP improved this morning - significant AKI, perhaps was dry, however, creatinine is now normal - Currently on Entresto  97/103 bid, aldactone  25 mg daily, amlodipine  5 mg daily, jardiance  10 mg daily and metoprolol tartrate 37.5 mg BID.  PRN hydralazine . - Would monitor BP today for stability   5. HFimpEF - Last echo 2/25 EF 55-60% with no focal wall abnormalities, grade 1 diastolic dysfunction. Euvolemic on exam  - Restarted GDMT for HF above (Entresto , metoprolol, spironolactone , jardiance )   6. AKI - resolved with IVFs, looked to have been prerenal   For questions or updates, please contact [facility] Please consult [email or web address] for contact info under   Hazle Lites, MD, [facility], FNLA, FACP  [place]  [facility]  Medical Director of the Advanced Lipid Disorders &  Cardiovascular Risk Reduction Clinic Diplomate of the American Board of Clinical Lipidology Attending Cardiologist  Direct Dial: [phone]  Fax: [phone]  Website:  [email or web address]  Hazle Lites, MD  07/08/2023, 8:41 AM

## 2023-07-08 NOTE — Progress Notes (Signed)
 [facility] Progress Note  Patient: Dean Wilson    ZOX:[id number]  DOA: 07/05/2023     Date of Service: the patient was seen and examined on 07/08/2023  Chief Complaint  Patient presents with   Chest Pain   Brief hospital course: PUNEET MASONER is a 63 y.o. 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 03/2023: 55-60%), SVT, CVA, CKD, HTN, and OSA on CPAP p/w chest tightness.  Chest pain started 5:30 in the morning and patient noticed tachycardia heart rate in 130s and BP 120/80 which typically runs high, so EMS was called.   Of note, "[pt] has a hx of recurrent tachyrhythmia/SVT with induced Type II NSTEMI since this 2023 admission. He has had 2 prior hospital presentations this month (no formal 12-lead ECG to confirm SVT, his last confirmed SVT was 02/2016 that was treated with adenosine ). On 5/3 presented to ED with tachyrhythmia and discharged with prn propanol. Subsequently he represented to the ED on 06/14/23 and was admitted for tachyarrhythmia induced chest pain and NSTEMI (type II with troponin elevation to 361) due to underlying CAD. Admission was complicated by AKI (Cr 2.2 initially, discharged with 1.48) and hypotension (responsive to fluids)."   In the ED, pt presented bradycardic and hypotensive on RA. Labs notable for BUN/Cr 32/2.36 (baseline 1.5-1.7), and troponin 35-->773. ECG showed accelerated juinctional rhythm followed by bradycardia. Pt admitted to medicine for ongoing care with cards following.    Assessment and Plan:  # SVT Patient was found to have SVT, self converted.  Patient had intermittent bradycardia. Seen by EP cards, plan is for outpatient ablation, recommended Coreg  50 mg p.o. twice daily Patient had recurrent SVT in the morning on 5/31, blood pressure was soft.  S/p Lopressor 2.5 mg IV x 1 dose, followed by Lopressor 25 mg p.o. 6 hourly with hold parameters was started by  cardiology. 6/1 consolidated Lopressor 37.5 mg p.o. twice daily Monitor BP and heart rate and titrate medication accordingly   # A-fib with controlled VR, new onset 6/2 patient was noticed to have A-fib on the monitor, denied any symptoms, ventricular rate controlled Cardiology recommended Eliquis 5 mg p.o. twice daily, discontinued aspirin  and continued Lopressor. Continue to monitor on telemetry as per cards and plan for DC tomorrow a.m. 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 This admission patient is endorsing no current chest pain or exertional chest pain.  - elevated trop this admission secondary to SVT and tachycardia Continue aspirin  and statin  # Labile blood pressure Home medications were held on admission due to soft blood pressure.  S/p Lopressor as above.  Started Lopressor 37.5 mg p.o. twice daily as per cardiology 6/1 hypertensive urgency, uncontrolled BP.  Resumed home meds amlodipine  5 mg p.o. daily, Entresto  97-103 mg p.o. twice daily home dose.  Spironolactone  25 mg p.o. daily and Jardiance  10 mg p.o. daily started on hydralazine  IV/p.o. prn We will continue to monitor BP and titrate dose accordingly  # HFimpEF - Last echo 2/25 EF 55-60%  with no focal wall abnormalities, grade 1 diastolic dysfunction. 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 Resolved  Mildly low phosphorus level, continue to monitor    # H/o anxiety -PTA Xanax  0.5mg  at bedtime prn to prevent withdrawal seizures   Vitamin B12 level 355, goal >400, started oral supplement.  Follow with PCP to repeat B12 level after 3 to 6 months Vitamin D level 32, advised to start vitamin D supplement to prevent deficiency    Body mass index is 35.97 kg/m.  Interventions:  Diet: 2 g sodium diet DVT Prophylaxis: Subcutaneous Lovenox    Advance goals of care discussion: Full code  Family Communication: family was present at bedside, at the time of interview.  The pt provided permission to discuss medical plan with the family. Opportunity was given to ask question and all questions were answered satisfactorily.   Disposition:  Pt is from Home, admitted with SVTs, AKI, labile blood pressure, still has risk of activities and soft blood pressure, which precludes a safe discharge. Discharge to home, when stable and cleared by cardiology most likely in 1 to 2 days. 6/2 new onset A-fib, cardiology recommended to monitor overnight and DC plan tomorrow a.m.   Subjective: No significant events overnight, patient was noticed to have new onset A-fib but denied any symptoms, denied any chest pain or palpitations, no shortness of breath. As per cardiology we will continue to monitor and plan for discharge tomorrow a.m.  Physical Exam: General: NAD, lying comfortably Appear in no distress, affect appropriate Eyes: PERRLA ENT: Oral Mucosa Clear, moist  Neck: no JVD,  Cardiovascular: Irregular rhythm, no Murmur,  Respiratory: good respiratory effort, Bilateral Air entry equal and Decreased, no Crackles, no wheezes Abdomen: Bowel Sound present, Soft and no tenderness,  Skin: no rashes Extremities: no Pedal edema, no calf  tenderness Neurologic: without any new focal findings Gait not checked due to patient safety concerns  Vitals:   07/08/23 0440 07/08/23 0526 07/08/23 0743 07/08/23 1250  BP:   130/86 (!) 150/95  Pulse:   91 65  Resp:   18 18  Temp: 98.1 F (36.7 C)  98.5 F (36.9 C) 98.4 F (36.9 C)  TempSrc: Oral  Oral Oral  SpO2:   93% 94%  Weight:  120.3 kg    Height:        Intake/Output Summary (Last 24 hours) at 07/08/2023 1358 Last data filed at 07/08/2023 1217 Gross per 24 hour  Intake 1200 ml  Output --  Net 1200 ml   Filed Weights   07/06/23 0358 07/07/23 0348 07/08/23 0526  Weight: 124.4 kg 124.1 kg 120.3 kg    Data Reviewed: I have personally reviewed and interpreted daily labs, tele strips, imagings as discussed above. I reviewed all nursing notes, pharmacy notes, vitals, pertinent old records I have discussed plan of care as described above with RN and patient/family.  CBC: Recent Labs  Lab 07/05/23 0435 07/06/23 0903 07/08/23 0237  WBC 8.8 5.8 6.4  HGB 16.1 14.4 14.9  HCT 50.4 43.9 46.2  MCV 86.4 85.4 85.9  PLT 205 136* 159   Basic Metabolic Panel: Recent Labs  Lab 07/05/23 0435 07/05/23 0649 07/06/23 0903 07/07/23 0226 07/07/23 1007 07/08/23  0237  NA 139  --  138 137  --  137  K 4.0  --  3.6 3.4*  --  3.6  CL 107  --  106 106  --  104  CO2 20*  --  23 24  --  24  GLUCOSE 128*  --  160* 144*  --  100*  BUN 32*  --  23 22  --  19  CREATININE 2.36*  --  1.31* 1.28*  --  1.18  CALCIUM  9.2  --  8.7* 8.6*  --  8.8*  MG  --  1.9 1.9  --  2.0 2.0  PHOS  --   --  2.3*  --  3.0 3.5    Studies: No results found.  Scheduled Meds:  amLODipine   5 mg Oral Daily   apixaban  5 mg Oral BID   vitamin B-12  500 mcg Oral Daily   empagliflozin   10 mg Oral QAC breakfast   ezetimibe   10 mg Oral Daily   metoprolol tartrate  37.5 mg Oral BID   pantoprazole   40 mg Oral Daily   rosuvastatin   40 mg Oral Daily   sacubitril -valsartan   1 tablet Oral BID   spironolactone    25 mg Oral Daily   Continuous Infusions: PRN Meds: acetaminophen  **OR** acetaminophen , ALPRAZolam , hydrALAZINE  **OR** hydrALAZINE , ondansetron  (ZOFRAN ) IV  Time spent: 55 minutes  Author: Althia Atlas. MD [facility] 07/08/2023 1:58 PM  To reach On-call, see care teams to locate the attending and reach out to them via [email or web address]. If 7PM-7AM, please contact night-coverage If you still have difficulty reaching the attending provider, please page the [facility] (Director on Call) for [facility] on amion for assistance.

## 2023-07-09 DIAGNOSIS — I4891 Unspecified atrial fibrillation: Secondary | ICD-10-CM

## 2023-07-09 DIAGNOSIS — N179 Acute kidney failure, unspecified: Secondary | ICD-10-CM

## 2023-07-09 DIAGNOSIS — R0989 Other specified symptoms and signs involving the circulatory and respiratory systems: Secondary | ICD-10-CM | POA: Diagnosis not present

## 2023-07-09 DIAGNOSIS — I259 Chronic ischemic heart disease, unspecified: Secondary | ICD-10-CM | POA: Diagnosis not present

## 2023-07-09 MED ORDER — HYDRALAZINE HCL 25 MG PO TABS: 25.0000 mg | ORAL_TABLET | Freq: Three times a day (TID) | ORAL | 11 refills | Status: AC

## 2023-07-09 MED ORDER — VITAMIN D3 125 MCG (5000 UT) PO CAPS: 1.0000 | ORAL_CAPSULE | Freq: Every day | ORAL | 11 refills | Status: AC

## 2023-07-09 MED ORDER — APIXABAN 5 MG PO TABS: 5.0000 mg | ORAL_TABLET | Freq: Two times a day (BID) | ORAL | 11 refills | Status: AC

## 2023-07-09 MED ORDER — CYANOCOBALAMIN 500 MCG PO TABS: 500.0000 ug | ORAL_TABLET | Freq: Every day | ORAL | 0 refills | Status: AC

## 2023-07-09 MED ORDER — HYDRALAZINE HCL 25 MG PO TABS
25.0000 mg | ORAL_TABLET | Freq: Three times a day (TID) | ORAL | Status: DC
  Administered 2023-07-09: 25 mg via ORAL
  Filled 2023-07-09: qty 1

## 2023-07-09 MED ORDER — METOPROLOL TARTRATE 37.5 MG PO TABS
37.5000 mg | ORAL_TABLET | Freq: Two times a day (BID) | ORAL | 11 refills | Status: AC
Start: 2023-07-09 — End: 2024-07-08

## 2023-07-09 NOTE — Progress Notes (Signed)
 Rounding Note   Patient Name: Dean Wilson Date of Encounter: 07/09/2023  [facility] Cardiologist: Teddie Favre, MD   Subjective No complaints. Wants to go home. Converted back to sinus rhythm yesterday and stable overnight. BP remains somewhat elevated in the 140-160's systolic. Renal function is back to baseline - Cr 1.18 today.  Scheduled Meds:  amLODipine   5 mg Oral Daily   apixaban  5 mg Oral BID   vitamin B-12  500 mcg Oral Daily   empagliflozin   10 mg Oral QAC breakfast   ezetimibe   10 mg Oral Daily   metoprolol tartrate  37.5 mg Oral BID   pantoprazole   40 mg Oral Daily   rosuvastatin   40 mg Oral Daily   sacubitril -valsartan   1 tablet Oral BID   spironolactone   25 mg Oral Daily   Continuous Infusions:  PRN Meds: acetaminophen  **OR** acetaminophen , ALPRAZolam , hydrALAZINE  **OR** hydrALAZINE , ondansetron  (ZOFRAN ) IV   Vital Signs  Vitals:   07/08/23 2335 07/09/23 0422 07/09/23 0552 07/09/23 0731  BP: (!) 141/88 (!) 149/88  (!) 149/86  Pulse: 60   65  Resp: 19   18  Temp: 97.7 F (36.5 C) 97.7 F (36.5 C)  98.6 F (37 C)  TempSrc: Axillary Oral  Oral  SpO2: 92% 95%  96%  Weight:   120.1 kg   Height:        Intake/Output Summary (Last 24 hours) at 07/09/2023 0812 Last data filed at 07/09/2023 0733 Gross per 24 hour  Intake 960 ml  Output --  Net 960 ml      07/09/2023    5:52 AM 07/08/2023    5:26 AM 07/07/2023    3:48 AM  Last 3 Weights  Weight (lbs) 264 lb 12.4 oz 265 lb 3.4 oz 273 lb 9.5 oz  Weight (kg) 120.1 kg 120.3 kg 124.1 kg      Telemetry Sinus rhythm- personally reviewed  ECG  N/A  Physical Exam  GEN: No acute distress.   Neck: No JVD Cardiac: regular rhythm, no murmurs, rubs, or gallops.  Respiratory: Clear to auscultation bilaterally. GI: Soft, nontender, non-distended  MS: No edema; No deformity. Neuro:  Nonfocal  Psych: Frustrated  Labs High Sensitivity Troponin:   Recent Labs  Lab 06/14/23 2107  06/14/23 2303 07/05/23 0435 07/05/23 0649  TROPONINIHS 19* 361* 35* 773*     Chemistry Recent Labs  Lab 07/06/23 0903 07/07/23 0226 07/07/23 1007 07/08/23 0237  NA 138 137  --  137  K 3.6 3.4*  --  3.6  CL 106 106  --  104  CO2 23 24  --  24  GLUCOSE 160* 144*  --  100*  BUN 23 22  --  19  CREATININE 1.31* 1.28*  --  1.18  CALCIUM  8.7* 8.6*  --  8.8*  MG 1.9  --  2.0 2.0  GFRNONAA >60 >60  --  >60  ANIONGAP 9 7  --  9    Lipids No results for input(s): "CHOL", "TRIG", "HDL", "LABVLDL", "LDLCALC", "CHOLHDL" in the last 168 hours.  Hematology Recent Labs  Lab 07/05/23 0435 07/06/23 0903 07/08/23 0237  WBC 8.8 5.8 6.4  RBC 5.83* 5.14 5.38  HGB 16.1 14.4 14.9  HCT 50.4 43.9 46.2  MCV 86.4 85.4 85.9  MCH 27.6 28.0 27.7  MCHC 31.9 32.8 32.3  RDW 16.1* 15.9* 15.7*  PLT 205 136* 159   Thyroid   Recent Labs  Lab 07/05/23 0621  TSH 2.047    BNP  Recent Labs  Lab 07/05/23 0435  BNP 376.7*    DDimer No results for input(s): "DDIMER" in the last 168 hours.   Radiology  No results found.  Cardiac Studies N/A  Patient Profile   CHAIM GATLEY is a 63 y.o. 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 03/2023: 55-60%), SVT, CVA, CKD, HTN, and OSA on CPAP who is being seen 07/05/2023 for the evaluation of arrhthymias at the request of hospital medicine.   Assessment & Plan  1.SVT - admitted with palpitatoins - found to be in SVT, self converted. Has had some intermittent bradycardia as well - seen by EP Dr Carolynne Citron with plans for SVT ablation, however, now has new onset afib.   2. Atrial fibrillation - This is new onset- his CHADsVASC score is 4 - On Eliquis 5 mg BID. Stop low dose aspirin . - Will need outpatient follow-up for afib - ?ablation vs pharmacologic strategy - Self-converted to sinus rhythm   3. CAD - CAD status post DES to the mid LAD in April 2022. 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 This admission patient is endorsing no current chest pain or exertional chest pain.  - elevated trop this admission secondary to SVT and tachycardia   4. Labile bp - on mulitple meds at home for HTN - intermittent issues with low bp's this admission, received IVFs.  - BP improved this morning - significant AKI, perhaps was dry, however, creatinine is now normal - Currently on Entresto  97/103 bid, aldactone  25 mg daily, amlodipine  5 mg daily, jardiance  10 mg daily and metoprolol tartrate 37.5 mg BID.  PRN hydralazine . - BP Remains mildly elevated today  - restart hydralazine  25 mg q8hrs  5. HFimpEF - Last echo 2/25 EF 55-60% with no focal wall abnormalities, grade 1 diastolic dysfunction. Euvolemic on exam  - Restarted GDMT for HF above (Entresto , metoprolol, spironolactone , jardiance )   6. AKI - resolved with IVFs, looked to have been prerenal  Ok to d/c home today from cardiac standpoint. Follow-up with Dr. Londa Rival or APP.  [facility] will sign off.   Medication Recommendations:  as above Other recommendations (labs, testing, etc):  none Follow up as an outpatient:  Dr. Londa Rival or APP   For questions or updates, please contact [facility] Please consult [email or web address] for contact info under   Hazle Lites, MD, [facility], FNLA, FACP  [place]  [facility]  Medical Director of the [facility]  [facility] Diplomate of the American Board of Clinical Lipidology Attending Cardiologist  Direct Dial: [phone]  Fax: [phone]  Website:  [email or web address]  Hazle Lites, MD  07/09/2023, 8:12 AM

## 2023-07-09 NOTE — Discharge Summary (Signed)
 [facility] Discharge Summary   Patient: Dean Wilson WNU:[id number]  PCP: Orlena Bitters, MD  Date of admission: 07/05/2023   Date of discharge:  07/09/2023     Discharge Diagnoses:  Principal Problem:   Atrial fibrillation with normal ventricular rate (HCC) Active Problems:   Chest pain   NSTEMI (non-ST elevated myocardial infarction) [facility])   Admitted From: Home Disposition:  Home   Recommendations for Outpatient Follow-up:  Follow-up with PCP in 1 week.  Follow-up with cardiology in 1 week Follow up LABS/TEST:   Repeat CBC and BMP in 1 week.  Vitamin D level and B12 level after 3 months   Diet recommendation: Cardiac diet  Activity: The patient is advised to gradually reintroduce usual activities, as tolerated  Discharge Condition: stable  Code Status: Full code   History of present illness: As per the H and P dictated on admission Hospital Course:  Dean Wilson is a 63 y.o. 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 03/2023: 55-60%), SVT, CVA, CKD, HTN, and OSA on CPAP p/w chest tightness.  Chest pain started 5:30 in the morning and patient noticed tachycardia heart rate in 130s and BP 120/80 which typically runs high, so EMS was called.   Of note, "[pt] has a hx of recurrent tachyrhythmia/SVT with induced Type II NSTEMI since this 2023 admission. He has had 2 prior hospital presentations this month (no formal 12-lead ECG to confirm SVT, his last confirmed SVT was 02/2016 that was treated with adenosine ). On 5/3 presented to ED with tachyrhythmia and discharged with prn propanol. Subsequently he represented to the ED on 06/14/23 and was admitted for tachyarrhythmia induced chest pain and NSTEMI (type II with troponin elevation to 361) due to underlying CAD. Admission was complicated by AKI (Cr 2.2 initially, discharged with 1.48) and hypotension (responsive to fluids)."   In the ED, pt presented  bradycardic and hypotensive on RA. Labs notable for BUN/Cr 32/2.36 (baseline 1.5-1.7), and troponin 35-->773. ECG showed accelerated juinctional rhythm followed by bradycardia. Pt admitted to medicine for ongoing care with cards following.    Assessment and Plan:   # SVT Patient was found to have SVT, self converted.  Patient had intermittent bradycardia. Seen by EP cards, plan is for outpatient ablation, recommended Coreg  50 mg p.o. twice daily Patient had recurrent SVT in the morning on 5/31, blood pressure was soft.  S/p Lopressor 2.5 mg IV x 1 dose, followed by Lopressor 25 mg p.o. 6 hourly with hold parameters was started by cardiology. 6/1 consolidated Lopressor 37.5 mg p.o. twice daily Patient was cleared by cardiology to discharge home on current medications and follow-up as an outpatient   # Paroxysmal A-fib with controlled VR, new onset 6/2 patient was noticed to have A-fib on the monitor, denied any symptoms, ventricular rate controlled Cardiology recommended Eliquis 5 mg p.o. twice daily, discontinued aspirin  and continued Lopressor. 6/3 A-fib which self resolved, currently patient is in sinus rhythm. Cleared by cardiology to discharge and follow-up as an outpatient.   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 This admission patient is endorsing no current chest pain or exertional chest pain.  - elevated trop this admission secondary to SVT and tachycardia Continue aspirin  and statin   # Labile blood pressure Home medications were held on admission due to soft blood  pressure.  S/p Lopressor as above.  Started Lopressor 37.5 mg p.o. twice daily as per cardiology 6/1 hypertensive urgency, uncontrolled BP.  Resumed home meds amlodipine  5 mg p.o. daily, Entresto  97-103 mg p.o. twice daily home dose.  Spironolactone  25 mg p.o. daily and Jardiance  10 mg p.o. daily Cardiology recommended hydralazine  25 mg p.o. 3 times daily. Patient was advised to monitor BP at home and follow with PCP and cardiology  # HFimpEF - Last echo 2/25 EF 55-60% with no focal wall abnormalities, grade 1 diastolic dysfunction. Euvolemic on exam  Continue medications as above   # AKI most likely due to hypotension, prerenal. 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 Resolved  Mildly low phosphorus level, continue to monitor   # H/o anxiety -PTA Xanax  0.5mg  at bedtime prn to prevent withdrawal seizures   # Vitamin B12 level 355, goal >400, started oral supplement.  Follow with PCP to repeat B12 level after 3 to 6 months # Vitamin D level 32, advised to start vitamin D supplement to prevent deficiency    Body mass index is 35.91 kg/m.  Nutrition Interventions:   - Patient was instructed, not to drive, operate heavy machinery, perform activities at heights, swimming or participation in water  activities or provide baby sitting services while on Pain, Sleep and Anxiety Medications; until his outpatient Physician has advised to do so again.  - Also recommended to not to take more than prescribed Pain, Sleep and Anxiety Medications.  Patient was ambulatory without any assistance. On the day of the discharge the patient's vitals were stable, and no other acute medical condition were reported by patient. the patient was felt safe to be discharge at Home.  Consultants: Cardiology Procedures: None  Discharge Exam: General: Appear in no distress, no Rash; Oral Mucosa Clear,  moist. Cardiovascular: S1 and S2 Present, no Murmur, Respiratory: normal respiratory effort, Bilateral Air entry present and no Crackles, no wheezes Abdomen: Bowel Sound present, Soft and no tenderness, no hernia Extremities: no Pedal edema, no calf tenderness Neurology: alert and oriented to time, place, and person affect appropriate.  Filed Weights   07/07/23 0348 07/08/23 0526 07/09/23 0552  Weight: 124.1 kg 120.3 kg 120.1 kg   Vitals:   07/09/23 0422 07/09/23 0731  BP: (!) 149/88 (!) 149/86  Pulse:  65  Resp:  18  Temp: 97.7 F (36.5 C) 98.6 F (37 C)  SpO2: 95% 96%    DISCHARGE MEDICATION: Allergies as of 07/09/2023       Reactions   Banana Itching   Sunflower Oil Itching   Seeds not oil        Medication List     STOP taking these medications    Aspirin  Low Dose 81 MG tablet Generic drug: aspirin  EC   azithromycin 250 MG tablet Commonly known as: ZITHROMAX   carvedilol  25 MG tablet Commonly known as: COREG    clopidogrel  75 MG tablet Commonly known as: PLAVIX        TAKE these medications    ALPRAZolam  0.5 MG tablet Commonly known as: XANAX  Take 0.5 mg by mouth 2 (two) times daily as needed.  amLODipine  5 MG tablet Commonly known as: NORVASC  Take 1 tablet (5 mg total) by mouth daily.   apixaban 5 MG Tabs tablet Commonly known as: ELIQUIS Take 1 tablet (5 mg total) by mouth 2 (two) times daily.   cyanocobalamin 500 MCG tablet Commonly known as: VITAMIN B12 Take 1 tablet (500 mcg total) by mouth daily. Start taking on: July 10, 2023   empagliflozin  10 MG Tabs tablet Commonly known as: Jardiance  Take 1 tablet (10 mg total) by mouth daily before breakfast.   ezetimibe  10 MG tablet Commonly known as: ZETIA  Take 1 tablet (10 mg total) by mouth daily.   fluticasone 50 MCG/ACT nasal spray Commonly known as: FLONASE Place 1 spray into both nostrils daily as needed for allergies.   hydrALAZINE  25 MG tablet Commonly known as:  APRESOLINE  Take 1 tablet (25 mg total) by mouth every 8 (eight) hours.   Metoprolol Tartrate 37.5 MG Tabs Take 1 tablet (37.5 mg total) by mouth 2 (two) times daily.   multivitamin tablet Take 1 tablet by mouth daily.   pantoprazole  40 MG tablet Commonly known as: Protonix  Take 1 tablet (40 mg total) by mouth daily.   rosuvastatin  40 MG tablet Commonly known as: CRESTOR  Take 1 tablet (40 mg total) by mouth daily.   sacubitril -valsartan  97-103 MG Commonly known as: Entresto  Take 1 tablet by mouth 2 (two) times daily.   spironolactone  25 MG tablet Commonly known as: ALDACTONE  Take 1 tablet (25 mg total) by mouth daily.   torsemide  20 MG tablet Commonly known as: DEMADEX  Take 1 tablet (20 mg total) by mouth daily as needed.   Vitamin D3 125 MCG (5000 UT) Caps Take 1 capsule (5,000 Units total) by mouth daily.       Allergies  Allergen Reactions   Banana Itching   Sunflower Oil Itching    Seeds not oil   Discharge Instructions     Call MD for:   Complete by: As directed    Chest pain or palpitations.  Uncontrolled blood pressure   Call MD for:  difficulty breathing, headache or visual disturbances   Complete by: As directed    Call MD for:  extreme fatigue   Complete by: As directed    Call MD for:  persistant dizziness or light-headedness   Complete by: As directed    Call MD for:  severe uncontrolled pain   Complete by: As directed    Diet - low sodium heart healthy   Complete by: As directed    Discharge instructions   Complete by: As directed    Follow-up with PCP in 1 week.  Repeat CBC and BMP in 1 week.  Vitamin D level and B12 level after 3 months Follow-up with cardiology in 1 week   Increase activity slowly   Complete by: As directed        The results of significant diagnostics from this hospitalization (including imaging, microbiology, ancillary and laboratory) are listed below for reference.    Significant Diagnostic Studies: DG Chest Port  1 View Result Date: 07/05/2023 CLINICAL DATA:  63 year old male with chest pain. EXAM: PORTABLE CHEST 1 VIEW COMPARISON:  Portable chest 06/14/2023 and earlier. FINDINGS: Portable AP supine views at 0442 hours. Left chest pacer, resuscitation pads in place. Lung volumes and mediastinal contours are stable and within normal limits. Visualized tracheal air column is within normal limits. Allowing for portable technique the lungs are clear. No pneumothorax or pleural effusion identified on these supine views. No acute osseous  abnormality identified. Paucity of bowel gas. IMPRESSION: No acute cardiopulmonary abnormality. Electronically Signed   By: Marlise Simpers M.D.   On: 07/05/2023 05:06   DG Chest 1 View Result Date: 06/14/2023 CLINICAL DATA:  Care refused by patient. EXAM: CHEST  1 VIEW COMPARISON:  Jun 08, 2023 FINDINGS: The heart size and mediastinal contours are within normal limits. Mild, stable left basilar linear scarring and/or atelectasis is seen. No acute infiltrate, pleural effusion or pneumothorax is identified. The visualized skeletal structures are unremarkable. IMPRESSION: Mild, stable left basilar linear scarring and/or atelectasis. Electronically Signed   By: Virgle Grime M.D.   On: 06/14/2023 23:23    Microbiology: No results found for this or any previous visit (from the past 240 hours).   Labs: CBC: Recent Labs  Lab 07/05/23 0435 07/06/23 0903 07/08/23 0237  WBC 8.8 5.8 6.4  HGB 16.1 14.4 14.9  HCT 50.4 43.9 46.2  MCV 86.4 85.4 85.9  PLT 205 136* 159   Basic Metabolic Panel: Recent Labs  Lab 07/05/23 0435 07/05/23 0649 07/06/23 0903 07/07/23 0226 07/07/23 1007 07/08/23 0237  NA 139  --  138 137  --  137  K 4.0  --  3.6 3.4*  --  3.6  CL 107  --  106 106  --  104  CO2 20*  --  23 24  --  24  GLUCOSE 128*  --  160* 144*  --  100*  BUN 32*  --  23 22  --  19  CREATININE 2.36*  --  1.31* 1.28*  --  1.18  CALCIUM  9.2  --  8.7* 8.6*  --  8.8*  MG  --  1.9 1.9  --   2.0 2.0  PHOS  --   --  2.3*  --  3.0 3.5   Liver Function Tests: No results for input(s): "AST", "ALT", "ALKPHOS", "BILITOT", "PROT", "ALBUMIN" in the last 168 hours. No results for input(s): "LIPASE", "AMYLASE" in the last 168 hours. No results for input(s): "AMMONIA" in the last 168 hours. Cardiac Enzymes: No results for input(s): "CKTOTAL", "CKMB", "CKMBINDEX", "TROPONINI" in the last 168 hours. BNP (last 3 results) Recent Labs    07/05/23 0435  BNP 376.7*   CBG: No results for input(s): "GLUCAP" in the last 168 hours.  Time spent: 35 minutes  Signed:  Althia Atlas  [facility] 07/09/2023 10:17 AM

## 2023-07-09 NOTE — Telephone Encounter (Signed)
 Patient called to let office know that he has been admitted to [facility]- release ideally on 6/3 per pt.    Per Dr. Maximo Spar (hospital rounder):  1.SVT - admitted with palpitatoins - found to be in SVT, self converted. Has had some intermittent bradycardia as well - seen by EP Dr Carolynne Citron with plans for SVT ablation, however, now has new onset afib.   2. Atrial fibrillation - This is new onset- his CHADsVASC score is 4 - On Eliquis 5 mg BID. Stop low dose aspirin . - Will need outpatient follow-up for afib - ?ablation vs pharmacologic strategy - Self-converted to sinus rhythm  Will route to provider as FYI

## 2023-07-09 NOTE — TOC Transition Note (Signed)
 Transition of Care [facility]) - Discharge Note   Patient Details  Name: Dean Wilson MRN: [id number] Date of Birth: 1960/07/16  Transition of Care [facility] CM/SW Contact:  Jeani Mill, RN Phone Number: 07/09/2023, 12:55 PM   Clinical Narrative:    Patient stable for discharge.  No other TOC needs at this time.   Final next level of care: Home/Self Care Barriers to Discharge: Barriers Resolved   Patient Goals and CMS Choice Patient states their goals for this hospitalization and ongoing recovery are:: return home          Discharge Placement  home                     Discharge Plan and Services Additional resources added to the After Visit Summary for     Discharge Planning Services: Medication Assistance                                 Social Drivers of Health (SDOH) Interventions SDOH Screenings   Food Insecurity: No Food Insecurity (07/05/2023)  Housing: Low Risk  (07/05/2023)  Transportation Needs: No Transportation Needs (07/05/2023)  Utilities: Not At Risk (07/05/2023)  Alcohol Screen: Low Risk  (05/06/2020)  Financial Resource Strain: Low Risk  (05/06/2020)  Tobacco Use: Medium Risk (07/05/2023)     Readmission Risk Interventions     No data to display

## 2023-07-12 ENCOUNTER — Telehealth: Payer: Self-pay | Admitting: Physician Assistant

## 2023-07-12 NOTE — Telephone Encounter (Signed)
 Returned call to pt. No answer. Left msg to call back.

## 2023-07-12 NOTE — Telephone Encounter (Signed)
 Pt called in stating he is having a hard time keep monitor on. It keeps lifting up due to sweat. He has worn for 8 days. Please advise

## 2023-07-16 NOTE — Telephone Encounter (Signed)
 Patient went ahead and mailed monitor back in to [employer]. I advised patient that is fine and we would route to provider for advice and call patient with results once we receive them.

## 2023-07-16 NOTE — Telephone Encounter (Signed)
Not able to leave voice mail.

## 2023-07-23 ENCOUNTER — Encounter: Payer: Self-pay | Admitting: Cardiology

## 2023-07-23 ENCOUNTER — Ambulatory Visit: Attending: Cardiology | Admitting: Cardiology

## 2023-07-23 VITALS — BP 160/96 | HR 70 | Ht 72.0 in | Wt 270.2 lb

## 2023-07-23 DIAGNOSIS — I25119 Atherosclerotic heart disease of native coronary artery with unspecified angina pectoris: Secondary | ICD-10-CM | POA: Diagnosis not present

## 2023-07-23 DIAGNOSIS — I5032 Chronic diastolic (congestive) heart failure: Secondary | ICD-10-CM

## 2023-07-23 DIAGNOSIS — I471 Supraventricular tachycardia, unspecified: Secondary | ICD-10-CM | POA: Diagnosis not present

## 2023-07-23 DIAGNOSIS — I4891 Unspecified atrial fibrillation: Secondary | ICD-10-CM | POA: Diagnosis not present

## 2023-07-23 DIAGNOSIS — I10 Essential (primary) hypertension: Secondary | ICD-10-CM

## 2023-07-23 DIAGNOSIS — N1831 Chronic kidney disease, stage 3a: Secondary | ICD-10-CM

## 2023-07-23 MED ORDER — METOPROLOL TARTRATE 37.5 MG PO TABS: 37.5000 mg | ORAL_TABLET | Freq: Every day | ORAL | 11 refills | Status: DC | PRN

## 2023-07-23 MED ORDER — METOPROLOL SUCCINATE ER 100 MG PO TB24: 100.0000 mg | ORAL_TABLET | Freq: Every day | ORAL | 3 refills | Status: DC

## 2023-07-23 MED ORDER — AMLODIPINE BESYLATE 10 MG PO TABS: 10.0000 mg | ORAL_TABLET | Freq: Every day | ORAL | 3 refills | Status: AC

## 2023-07-23 NOTE — Progress Notes (Signed)
 Cardiology Office Note:   Date:  07/24/2023  ID:  Dean Wilson, DOB [birth year], MRN [id number] PCP: Orlena Bitters, MD  [facility] Providers Cardiologist:  Teddie Favre, MD    History of Present Illness:   Discussed the use of AI scribe software for clinical note transcription with the patient, who gave verbal consent to proceed.  History of Present Illness Dean Wilson is a 63 year old male with CAD, HF with recovered EF, CKD, HTN, OSA. He presents for follow-up after recent admissions for SVT and atrial fibrillation.  He has a history of coronary artery disease, heart failure with recovered ejection fraction, chronic kidney disease, hypertension, and obstructive sleep apnea. Recently, he was admitted 5/9-5/11 for supraventricular tachycardia (SVT) and non-ST elevation myocardial infarction. Initially, he presented with tachycardia, chest discomfort, and shortness of breath. Hospitalization complicated by AKI and hypotension. A similar episode of arrhythmia had occurred the previous week prompting ED visit, and he was discharged on a beta blocker, which controlled his symptoms until the day before his recent admission. At that time, he experienced a rapid heart rate of 140 bpm, as noted on his Apple Watch, persisting for over an hour without relief from propranolol , leading to chest pain and shortness of breath. At discharge on 5/11, patient had Coreg  decreased from 25mg  BID to 6.25mg  BID. Propranolol  stopped. He then saw PCP and Coreg  was increased first to 12.5mg  BID, then ultimately back to 25mg  BID due to hypertension.   He was seen in the cardiology clinic on May 29th, where he was doing well and wearing a heart monitor. However, on May 30th, he returned to the emergency department with palpitations, chest tightness, shortness of breath, and presyncope. Troponin was again elevated. EP saw patient in consult and with 3rd ED visit/second admission for SVT, an ablation was  tentatively planned for 6/10. However, prior to discharge, patient was found to have developed atrial fibrillation. Had spontaneous conversion to NSR. During his admission, BP had been labile and he was discharged on Lopressor  rather than Toprol  XL to allow for titration alongside his HF GDMT. Since his most recent discharge, he has been taking his medications, including metoprolol , which he believes helps with his symptoms. He still experiences episodes of his heart going 'out of rhythm' for 15-20 minutes, occurring two to three times daily, often noticed when he is at rest. He has not experienced significant chest pain or shortness of breath since discharge. He has not needed to take torsemide  at home and reports no leg swelling or significant shortness of breath. He sleeps with his head elevated due to sinus issues but does not require a recliner.  His current medication regimen includes amlodipine  5 mg, Eliquis  5 mg twice daily, Jardiance  10 mg, Zetia  10 mg, hydralazine  25 mg every eight hours, metoprolol  tartrate 37.5 mg twice daily, rosuvastatin  40 mg, Entresto  97/103 mg twice daily, spironolactone  25 mg once daily, and torsemide  20 mg as needed. He has not experienced any bleeding issues since starting Eliquis  and notes less bruising compared to when he was on Plavix .  He has a history of a slight stroke and a previous heart attack, for which he had a stent placed. He expresses concern about his recent episodes, stating that during his last hospital visit, he felt he 'might not be here much longer.' He previously engaged in regular physical activity at [employer] but is currently avoiding heavy exercise.  Studies Reviewed:    EKG:   EKG  Interpretation Date/Time:  Tuesday July 23 2023 14:52:00 EDT Ventricular Rate:  70 PR Interval:  166 QRS Duration:  90 QT Interval:  388 QTC Calculation: 419 R Axis:   74  Text Interpretation: Normal sinus rhythm Normal ECG When compared with ECG of  08-Jul-2023 13:58, Nonspecific T wave abnormality no longer evident in Lateral leads Confirmed by Leala Prince [phone]) on 07/23/2023 2:54:06 PM    03/20/23 TTE  IMPRESSIONS     1. Left ventricular ejection fraction, by estimation, is 55 to 60%. The  left ventricle has normal function. The left ventricle has no regional  wall motion abnormalities. There is mild left ventricular hypertrophy.  Left ventricular diastolic parameters  are consistent with Grade I diastolic dysfunction (impaired relaxation).  Elevated left atrial pressure.   2. Right ventricular systolic function is normal. The right ventricular  size is normal. Tricuspid regurgitation signal is inadequate for assessing  PA pressure.   3. The mitral valve is normal in structure. Trivial mitral valve  regurgitation. No evidence of mitral stenosis.   4. The aortic valve is tricuspid. Aortic valve regurgitation is not  visualized. No aortic stenosis is present.   5. Aortic dilatation noted. 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   Comparison(s): EF 50-55%. Moderate LVH.   FINDINGS   Left Ventricle: Left ventricular ejection fraction, by estimation, is 55  to 60%. The left ventricle has normal function. The left ventricle has no  regional wall motion abnormalities. Global longitudinal strain performed  but not reported based on  interpreter judgement due to suboptimal tracking. The left ventricular  internal cavity size was normal in size. There is mild left ventricular  hypertrophy. Left ventricular diastolic parameters are consistent with  Grade I diastolic dysfunction (impaired  relaxation). Elevated left atrial pressure.   Right Ventricle: The right ventricular size is normal. Right vetricular  wall thickness was not well visualized. Right ventricular systolic  function is normal. Tricuspid regurgitation signal is inadequate for  assessing PA pressure.   Left Atrium: Left atrial size was  normal in size.   Right Atrium: Right atrial size was normal in size.   Pericardium: There is no evidence of pericardial effusion.   Mitral Valve: The mitral valve is normal in structure. There is mild  thickening of the mitral valve leaflet(s). There is mild calcification of  the mitral valve leaflet(s). Mild mitral annular calcification. Trivial  mitral valve regurgitation. No evidence   of mitral valve stenosis. MV peak gradient, 6.1 mmHg. The mean mitral  valve gradient is 2.0 mmHg.   Tricuspid Valve: The tricuspid valve is normal in structure. Tricuspid  valve regurgitation is not demonstrated. No evidence of tricuspid  stenosis.   Aortic Valve: The aortic valve is tricuspid. Aortic valve regurgitation is  not visualized. No aortic stenosis is present. Aortic valve mean gradient  measures 5.0 mmHg. Aortic valve peak gradient measures 7.6 mmHg. Aortic  valve area, by VTI measures 2.36  cm.   Pulmonic Valve: The pulmonic valve was not well visualized. Pulmonic valve  regurgitation is not visualized. No evidence of pulmonic stenosis.   Aorta: The aortic root is normal in size and structure and aortic  dilatation noted. There is mild dilatation of the ascending aorta,  measuring 37 mm.   IAS/Shunts: No atrial level shunt detected by color flow Doppler.   Additional Comments: 3D was performed not requiring image post processing  on an independent workstation and was normal.   Risk  Assessment/Calculations:    CHA2DS2-VASc Score = 5   This indicates a 7.2% annual risk of stroke. The patient's score is based upon: CHF History: 1 HTN History: 1 Diabetes History: 0 Stroke History: 2 Vascular Disease History: 1 Age Score: 0 Gender Score: 0    HYPERTENSION CONTROL Vitals:   07/23/23 1449 07/23/23 1500  BP: (!) 152/98 (!) 160/96    The patient's blood pressure is elevated above target today.  In order to address the patient's elevated BP: A new medication was  prescribed today.; A current anti-hypertensive medication was adjusted today.           Physical Exam:   VS:  BP (!) 160/96   Pulse 70   Ht 6' (1.829 m)   Wt 270 lb 3.2 oz (122.6 kg)   SpO2 98%   BMI 36.65 kg/m    Wt Readings from Last 3 Encounters:  07/23/23 270 lb 3.2 oz (122.6 kg)  07/09/23 264 lb 12.4 oz (120.1 kg)  06/16/23 272 lb 7.8 oz (123.6 kg)     Physical Exam Vitals reviewed.  Constitutional:      Appearance: Normal appearance.  HENT:     Head: Normocephalic.   Eyes:     Pupils: Pupils are equal, round, and reactive to light.    Cardiovascular:     Rate and Rhythm: Normal rate and regular rhythm.     Pulses: Normal pulses.     Heart sounds: Normal heart sounds.  Pulmonary:     Effort: Pulmonary effort is normal.     Breath sounds: Normal breath sounds.  Abdominal:     General: Abdomen is flat.     Palpations: Abdomen is soft.   Musculoskeletal:     Right lower leg: No edema.     Left lower leg: No edema.   Skin:    General: Skin is warm and dry.     Capillary Refill: Capillary refill takes less than 2 seconds.   Neurological:     General: No focal deficit present.     Mental Status: He is alert and oriented to person, place, and time.   Psychiatric:        Mood and Affect: Mood normal.        Behavior: Behavior normal.        Thought Content: Thought content normal.        Judgment: Judgment normal.      ASSESSMENT AND PLAN:    Assessment & Plan Atrial fibrillation Patient with 2 recent admission in setting of SVT found to have developed afib during second admission. Had spontaneous cardioversion prior to d/c. Now wearing heart monitor. Has had recurrent episodes of palpitations. Episodes occur multiple times daily, lasting 15-20 minutes, resolving spontaneously. Unknown at this time if these episodes are afib or SVT. Increased stroke risk necessitates Eliquis . Currently on a heart monitor to assess arrhythmia frequency. - Continue  Eliquis  5 mg twice daily - Switch to Toprol  XL 100 mg once daily - Use short-acting metoprolol  37.5 mg as needed for prolonged episodes of rapid heart rate - Await heart monitor results  Supraventricular tachycardia (SVT) SVT with rapid heart rate, chest discomfort, and shortness of breath leading to 2 admissions. Planned SVT ablation complicated by new atrial fibrillation. Metoprolol  appears to be managing heart rate.  - Switch to Toprol  XL 100 mg once daily - Use short-acting metoprolol  37.5 mg as needed for prolonged episodes of rapid heart rate - Await heart monitor results - Coordinate with  EP team for potential ablation strategy  Non-ST elevation myocardial infarction (NSTEMI) Status post DES to the mid LAD in April 2022. 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 Recent NSTEMI with elevated troponin during hospitalization for SVT. Currently no chest pain or significant shortness of breath. On heart failure and anticoagulation therapy. - Elevated troponin and chest discomfort in setting of SVT appears consistent with demand ischemia. No symptoms when in normal rhythm. Continue to monitor closely and consider ischemic evaluation if patient has acceleration of symptoms. - Continue Crestor  40mg , Zetia  10mg  - Transition Lopressor  to Toprol  XL as above. - No ASA with new Eliquis .   Hyperlipidemia No recent lipid panel available. Will check fasting lipid panel and LFTs. With disease in multiple coronary vessels, would target LDL 55mg /dL. - Continue Crestor  40mg  and Zetia  10mg  pending updated labs. May need referral to lipid clinic.   Lab Results  Component Value Date   CHOL 110 01/31/2022   HDL 61 01/31/2022   LDLCALC 19 01/31/2022   TRIG 150 (H) 01/31/2022   CHOLHDL 1.8 01/31/2022      Heart failure with recovered ejection fraction Heart failure with previously reduced ejection fraction, now recovered. LVEF 55-60% as of February 2025. NYHA class I-II (having dyspnea with SVT only). Euvolemic today.  - Continue Entresto  97/103 mg twice daily - Continue spironolactone  25 mg once daily - Continue Jardiance  10 mg daily - Continue hydralazine  25 mg every 8 hours - Transition Lopressor  to Toprol  XL 100mg  daily   Hypertension Labile blood pressure during recent admissions though now elevated. Blood pressure management is crucial to prevent cardiac complications.  - Increase amlodipine  to 10 mg daily - Switch to Toprol  XL 100 mg once daily - Continue HF GDMT as above. - Monitor blood pressure at home  AKI on CKD Recent acute kidney injury likely due to dehydration. Repeat BMET on 6/2 with return to normal kidney function. Currently no diuretic use at home.   Obstructive sleep apnea Obstructive sleep apnea managed with CPAP therapy. Reports consistent CPAP use at night. - Continue CPAP therapy             Signed, Leala Prince, PA-C

## 2023-07-23 NOTE — Patient Instructions (Signed)
 Medication Instructions:  Your physician has recommended you make the following change in your medication:  INCREASE: amlodipine  (Norvasc ) to 10 mg by mouth once daily  START: metoprolol  succinate (Toprol  XL) 100 mg by mouth once daily  CHANGE: metoprolol  tartrate to 37.5 mg by mouth once daily as needed for rapid heart rate   *If you need a refill on your cardiac medications before your next appointment, please call your pharmacy*  Lab Work: Fasting lipid panel and Liver function test: at any [facility]   If you have labs (blood work) drawn today and your tests are completely normal, you will receive your results only by: MyChart Message (if you have MyChart) OR A paper copy in the mail If you have any lab test that is abnormal or we need to change your treatment, we will call you to review the results.  Testing/Procedures: Your provider has recommended that you follow up with EP (electrophysiology).  Follow-Up:As scheduled At [facility], you and your health needs are our priority.  As part of our continuing mission to provide you with exceptional heart care, our providers are all part of one team.  This team includes your primary Cardiologist (physician) and Advanced Practice Providers or APPs (Physician Assistants and Nurse Practitioners) who all work together to provide you with the care you need, when you need it.

## 2023-07-27 ENCOUNTER — Telehealth: Payer: Self-pay | Admitting: Cardiology

## 2023-07-27 DIAGNOSIS — I471 Supraventricular tachycardia, unspecified: Secondary | ICD-10-CM

## 2023-07-27 NOTE — Telephone Encounter (Signed)
 Outpatient service line: I rhythm notification  Patient recently seen for atrial fibrillation and SVT.  I rhythm is providing alert describing on May 29 that they had an episode of VT lasting approximately 30 seconds with average heart rate being around 138.  2 episodes of SVT.  No episodes of atrial fibrillation.  There were just recently seen July 23, 2023 and had addressed some of these concerns.  Sending to office provider just to notify.  Not calling patient as this is remote notification and already assessed recent office visit 06/17

## 2023-07-28 NOTE — Telephone Encounter (Signed)
 I am covering [employer] inbox this week. The prelim report has posted to the chart and the episode of VT is stating it was possibly 4 minutes 25 seconds long but possibly abberrancy. He also had an episode of SVT lasting over 3 hours. Complicating this was the presence of an episode of significant bradycardia at 21bpm. I have reached out to Thom Sluder PA-C to discuss with Dr. Waddell with EP team today as they are working on call together today, to advise on any more urgent recommendations for this monitor in the meantime.  [facility] prelim report:  Patient had a min HR of 21 bpm, max HR of 179 bpm, and avg HR of 69 bpm. Predominant underlying rhythm was Sinus Rhythm. 1 run of Ventricular Tachycardia occurred lasting 4 mins 25 secs with a max rate of 179 bpm (avg 135 bpm). Episode of Ventricular  Tachycardia may be Supraventricular Tachycardia with possible aberrancy. 2 Supraventricular Tachycardia runs occurred, the run with the fastest interval lasting 3 hours 39 mins with a max rate of 152 bpm (avg 134 bpm); the run with the fastest interval  was also the longest. 1 episode(s) of AV Block (2nd) occurred, lasting a total of 7 secs. Ventricular Tachycardia and Supraventricular Tachycardia were detected within +/- 45 seconds of symptomatic patient event(s). Isolated SVEs were rare (<1.0%), SVE  Couplets were rare (<1.0%), and SVE Triplets were rare (<1.0%). Isolated VEs were rare (<1.0%, 157), VE Couplets were rare (<1.0%, 4), and VE Triplets were rare (<1.0%, 1). Difficulty discerning atrial activity making definitive diagnosis difficult to  ascertain. Inverted QRS complexes possibly due to inverted placement of device. MD notification criteria for Ventricular Tachycardia met - report posted prior to notification (TL).\

## 2023-07-29 NOTE — Telephone Encounter (Signed)
 Left the pt  a message to call the office back to endorse recommendations per Dayna Dunn PA-C (covering for Artist Pouch PA-C).

## 2023-07-29 NOTE — Telephone Encounter (Signed)
 Covering Dean Wilson's inbox today. I appreciate Thom reviewing with Dr. Waddell, no new recs from EP standpoint, keep f/u in July as scheduled. In review of lytes, recent magnesium  was wnl but BMET showed potassium slightly below goal for arrhythmias. Please have patient return for non-fasting BMET at his convenience under dx SVT. Thank you!

## 2023-07-30 DIAGNOSIS — R002 Palpitations: Secondary | ICD-10-CM

## 2023-07-31 ENCOUNTER — Ambulatory Visit: Payer: Self-pay | Admitting: Physician Assistant

## 2023-08-02 NOTE — Telephone Encounter (Signed)
 Left the pt a message to call the office back.  2nd attempt made.

## 2023-08-03 NOTE — Telephone Encounter (Signed)
 Will park in Evan's inbox as FYI of communication below.

## 2023-08-07 NOTE — Telephone Encounter (Signed)
 Left the pt a message to call the office back.   3rd attempt at trying to make contact with the pt, about recommendations per Dayna Dunn PA-C.

## 2023-08-08 NOTE — Addendum Note (Signed)
 Addended by: GLADIS PORTER HERO on: 08/08/2023 04:57 PM   Modules accepted: Orders

## 2023-08-08 NOTE — Telephone Encounter (Signed)
 Was able to make contact with the pt about this message and further recommendations.  Pt will come in next week to have his BMET done, for it was previously planned for him to get his lipids and LFTs done at that time, per Artist Pouch PA-C last OV note.  Will place the BMET in the system and release, and pt can get this done same time he gets his lipids/LFTs done next week.   Pt aware to keep his follow-up appt as scheduled with Dr. Waddell on 7/17.  Pt verbalized understanding and agrees with this plan.

## 2023-08-16 ENCOUNTER — Ambulatory Visit: Payer: Self-pay | Admitting: Cardiology

## 2023-08-16 ENCOUNTER — Ambulatory Visit: Attending: Internal Medicine | Admitting: Internal Medicine

## 2023-08-16 ENCOUNTER — Encounter: Payer: Self-pay | Admitting: Internal Medicine

## 2023-08-16 VITALS — BP 138/72 | HR 63 | Ht 72.0 in | Wt 272.3 lb

## 2023-08-16 DIAGNOSIS — E782 Mixed hyperlipidemia: Secondary | ICD-10-CM

## 2023-08-16 DIAGNOSIS — I471 Supraventricular tachycardia, unspecified: Secondary | ICD-10-CM | POA: Diagnosis not present

## 2023-08-16 NOTE — Progress Notes (Signed)
 HPI Mr. Kanaan returns today for followup. He is a pleasan 63 yo man with incessant SVT who was hospitalized a couple of months ago. He subsequently had a single episode of atrial fib. He has done well in the interim though he has continue to have SVT with regular episodes around 130/min. Before he was on meds his rates were in the 160's. These are adenosine  sensitive. The patient wore a cardiac monitor which showed hours of SVT but no atrial fib.  Allergies  Allergen Reactions   Banana Itching   Sunflower Oil Itching    Seeds not oil     Current Outpatient Medications  Medication Sig Dispense Refill   ALPRAZolam  (XANAX ) 0.5 MG tablet Take 0.5 mg by mouth 2 (two) times daily as needed.     amLODipine  (NORVASC ) 10 MG tablet Take 1 tablet (10 mg total) by mouth daily. 90 tablet 3   apixaban  (ELIQUIS ) 5 MG TABS tablet Take 1 tablet (5 mg total) by mouth 2 (two) times daily. 60 tablet 11   Cholecalciferol (VITAMIN D3) 125 MCG (5000 UT) CAPS Take 1 capsule (5,000 Units total) by mouth daily. 30 capsule 11   cyanocobalamin  (VITAMIN B12) 500 MCG tablet Take 1 tablet (500 mcg total) by mouth daily. 90 tablet 0   empagliflozin  (JARDIANCE ) 10 MG TABS tablet Take 1 tablet (10 mg total) by mouth daily before breakfast. 30 tablet 11   ezetimibe  (ZETIA ) 10 MG tablet Take 1 tablet (10 mg total) by mouth daily. 90 tablet 3   fluticasone (FLONASE) 50 MCG/ACT nasal spray Place 1 spray into both nostrils daily as needed for allergies.     hydrALAZINE  (APRESOLINE ) 25 MG tablet Take 1 tablet (25 mg total) by mouth every 8 (eight) hours. 90 tablet 11   metoprolol  succinate (TOPROL -XL) 100 MG 24 hr tablet Take 1 tablet (100 mg total) by mouth daily. Take with or immediately following a meal. 90 tablet 3   Metoprolol  Tartrate 37.5 MG TABS Take 1 tablet (37.5 mg total) by mouth daily as needed (high heart rate). 30 tablet 11   Multiple Vitamin (MULTIVITAMIN) tablet Take 1 tablet by mouth daily.      pantoprazole  (PROTONIX ) 40 MG tablet Take 1 tablet (40 mg total) by mouth daily. 30 tablet 11   rosuvastatin  (CRESTOR ) 40 MG tablet Take 1 tablet (40 mg total) by mouth daily. 30 tablet 6   sacubitril -valsartan  (ENTRESTO ) 97-103 MG Take 1 tablet by mouth 2 (two) times daily. 180 tablet 3   spironolactone  (ALDACTONE ) 25 MG tablet Take 1 tablet (25 mg total) by mouth daily. 90 tablet 1   torsemide  (DEMADEX ) 20 MG tablet Take 1 tablet (20 mg total) by mouth daily as needed. 90 tablet 3   No current facility-administered medications for this visit.     Past Medical History:  Diagnosis Date   CAD (coronary artery disease)    DES to mid LAD April 2022   Essential hypertension    GERD (gastroesophageal reflux disease)    History of cardiomyopathy    OSA on CPAP    PSVT (paroxysmal supraventricular tachycardia) (HCC)    Stroke (HCC)     ROS:   All systems reviewed and negative except as noted in the HPI.   Past Surgical History:  Procedure Laterality Date   CORONARY STENT INTERVENTION N/A 05/20/2020   Procedure: CORONARY STENT INTERVENTION;  Surgeon: Verlin Lonni BIRCH, MD;  Location: [facility] INVASIVE CV LAB;  Service: Cardiovascular;  Laterality: N/A;  LEFT HEART CATH AND CORONARY ANGIOGRAPHY N/A 01/31/2022   Procedure: LEFT HEART CATH AND CORONARY ANGIOGRAPHY;  Surgeon: Wonda Sharper, MD;  Location: [facility] INVASIVE CV LAB;  Service: Cardiovascular;  Laterality: N/A;   RIGHT/LEFT HEART CATH AND CORONARY ANGIOGRAPHY N/A 05/20/2020   Procedure: RIGHT/LEFT HEART CATH AND CORONARY ANGIOGRAPHY;  Surgeon: Cherrie Toribio SAUNDERS, MD;  Location: [facility] INVASIVE CV LAB;  Service: Cardiovascular;  Laterality: N/A;   UMBILICAL HERNIA REPAIR       Family History  Problem Relation Age of Onset   Arrhythmia Mother    Heart attack Father    Arrhythmia Sister        a-fib   Arrhythmia Brother    Anesthesia problems Neg Hx    Hypotension Neg Hx    Malignant hyperthermia Neg Hx    Pseudochol deficiency  Neg Hx      Social History   Socioeconomic History   Marital status: Married    Spouse name: Nena Murray   Number of children: 2   Years of education: Not on file   Highest education level: High school graduate  Occupational History   Occupation: Tax inspector    Employer: [employer]  Tobacco Use   Smoking status: Former    Current packs/day: 0.00    Average packs/day: 2.0 packs/day for 25.0 years (50.0 ttl pk-yrs)    Types: Cigarettes    Start date: 01/26/1983    Quit date: 01/26/2008    Years since quitting: 15.5   Smokeless tobacco: Never  Vaping Use   Vaping status: Never Used  Substance and Sexual Activity   Alcohol use: Never   Drug use: No   Sexual activity: Yes    Birth control/protection: None  Other Topics Concern   Not on file  Social History Narrative   Not on file   Social Drivers of Health   Financial Resource Strain: Low Risk  (05/06/2020)   Overall Financial Resource Strain (CARDIA)    Difficulty of Paying Living Expenses: Not very hard  Food Insecurity: No Food Insecurity (07/05/2023)   Hunger Vital Sign    Worried About Running Out of Food in the Last Year: Never true    Ran Out of Food in the Last Year: Never true  Transportation Needs: No Transportation Needs (07/05/2023)   PRAPARE - Administrator, Civil Service (Medical): No    Lack of Transportation (Non-Medical): No  Physical Activity: Not on file  Stress: Not on file  Social Connections: Not on file  Intimate Partner Violence: Not At Risk (07/05/2023)   Humiliation, Afraid, Rape, and Kick questionnaire    Fear of Current or Ex-Partner: No    Emotionally Abused: No    Physically Abused: No    Sexually Abused: No     BP 138/72   Pulse 63   Ht 6' (1.829 m)   Wt 272 lb 4.8 oz (123.5 kg)   SpO2 98%   BMI 36.93 kg/m   Physical Exam:  Obese appearing NAD HEENT: Unremarkable Neck:  No JVD, no thyromegally Lymphatics:  No adenopathy Back:  No CVA  tenderness Lungs:  Clear with no wheezes HEART:  Regular rate rhythm, no murmurs, no rubs, no clicks Abd:  soft, positive bowel sounds, no organomegally, no rebound, no guarding Ext:  2 plus pulses, no edema, no cyanosis, no clubbing Skin:  No rashes no nodules Neuro:  CN II through XII intact, motor grossly intact  Assess/Plan: SVT - he continues to have symptoms.  I have offered him catheter ablation. We discussed afib ablation as well. He has only had the single episode of atrial fib and is not a particularly good candidate for PVI with significant obesity and sleep apnea. I have offered him catheter ablation and then watchful waiting. The risks/benefits/goals/expectations of SVT were reviewed and he wishes to proceed.  Afib - I have strongly encouraged him to lose weight. As his afib has only occurred in the setting of prolonged SVT, I'll hold off on PVI and would consider following. Obesity - I strongly encouraged the patient to work on weight loss after his ablation. He admits to being sedentary with all of the SVT.  Danelle Zarea Diesing,MD

## 2023-08-16 NOTE — Patient Instructions (Addendum)
 Medication Instructions:  Your physician recommends that you continue on your current medications as directed. Please refer to the Current Medication list given to you today.  *If you need a refill on your cardiac medications before your next appointment, please call your pharmacy*  Lab Work: Lab work needs to be done within 30 days.   You may go to any [facility] Location for your lab work:  [facility] - [address] ([facility]) - [address] [phone] [address]  [place] - [address]   [place]  - [address]   [place] - [address] - [address] [facility] [facility]  - [address] ([facility]   If you have labs (blood work) drawn today and your tests are completely normal, you will receive your results only by: [employer] (if you have MyChart)  If you have any lab test that is abnormal or we need to change your treatment, we will call you or send a MyChart message to review the results.  Testing/Procedures: SVT ablation on August 20th. Instructions to come.  Follow-Up: At [facility], you and your health needs are our priority.  As part of our continuing mission to provide you with exceptional heart care, we have created designated Provider Care Teams.  These Care Teams include your primary Cardiologist (physician) and Advanced Practice Providers (APPs -  Physician Assistants and Nurse Practitioners) who all work together to provide you with the care you need, when you need it.  We recommend signing up for the patient portal called MyChart.  Sign up information is provided on this After Visit Summary.  MyChart is used to connect with patients for Virtual Visits (Telemedicine).  Patients are able to view lab/test results, encounter notes, upcoming appointments, etc.  Non-urgent messages can be sent to your provider as well.   To  learn more about what you can do with MyChart, go to [email or web address].    Your next appointment:   To be scheduled  The format for your next appointment:   In Person  Provider:   Danelle Birmingham, MD{or one of the following Advanced Practice Providers on your designated Care Team:   Charlies Arthur, [place] Ozell Jodie Passey, [place] Leotis Barrack, NP  Note: Remote monitoring is used to monitor your Pacemaker/ ICD from home. This monitoring reduces the number of office visits required to check your device to one time per year. It allows us  to keep an eye on the functioning of your device to ensure it is working properly.

## 2023-08-20 ENCOUNTER — Other Ambulatory Visit: Payer: Self-pay

## 2023-08-20 DIAGNOSIS — I471 Supraventricular tachycardia, unspecified: Secondary | ICD-10-CM

## 2023-08-20 DIAGNOSIS — Z01812 Encounter for preprocedural laboratory examination: Secondary | ICD-10-CM

## 2023-08-22 ENCOUNTER — Ambulatory Visit: Admitting: Internal Medicine

## 2023-08-31 LAB — CBC
Hematocrit: 52.7 % — ABNORMAL HIGH (ref 37.5–51.0)
Hemoglobin: 16.4 g/dL (ref 13.0–17.7)
MCH: 27.6 pg (ref 26.6–33.0)
MCHC: 31.1 g/dL — ABNORMAL LOW (ref 31.5–35.7)
MCV: 89 fL (ref 79–97)
Platelets: 176 x10E3/uL (ref 150–450)
RBC: 5.94 x10E6/uL — ABNORMAL HIGH (ref 4.14–5.80)
RDW: 15.5 % — ABNORMAL HIGH (ref 11.6–15.4)
WBC: 6.9 x10E3/uL (ref 3.4–10.8)

## 2023-08-31 LAB — BASIC METABOLIC PANEL WITH GFR
BUN/Creatinine Ratio: 13 (ref 10–24)
BUN: 21 mg/dL (ref 8–27)
CO2: 25 mmol/L (ref 20–29)
Calcium: 9.6 mg/dL (ref 8.6–10.2)
Chloride: 106 mmol/L (ref 96–106)
Creatinine, Ser: 1.58 mg/dL — ABNORMAL HIGH (ref 0.76–1.27)
Glucose: 105 mg/dL — ABNORMAL HIGH (ref 70–99)
Potassium: 4.7 mmol/L (ref 3.5–5.2)
Sodium: 148 mmol/L — ABNORMAL HIGH (ref 134–144)
eGFR: 49 mL/min/1.73 — ABNORMAL LOW (ref >59)

## 2023-09-10 ENCOUNTER — Telehealth: Payer: Self-pay | Admitting: Cardiology

## 2023-09-10 NOTE — Telephone Encounter (Signed)
 Pt c/o medication issue:  1. Name of Medication: metoprolol  succinate (TOPROL -XL) 100 MG 24 hr tablet   2. How are you currently taking this medication (dosage and times per day)?    3. Are you having a reaction (difficulty breathing--STAT)? No   4. What is your medication issue? Patient states that he have to take more medication then what is prescribe. He states that it is working but want to make dr aware of it. Please advise

## 2023-09-10 NOTE — Telephone Encounter (Signed)
 Unable to leave message, voicemail full

## 2023-09-10 NOTE — Telephone Encounter (Signed)
 Patient reports every evening he has elevated HR (130 ) and has to take his lopressor . He says he drinks decaf coffee but does drink tea.I advised him to avoid all caffeine for now and he wants to switch his time take Toprol  from am to pm and I advised him this would be fine.  Has SVT ablation scheduled for later this month.

## 2023-09-17 ENCOUNTER — Telehealth (HOSPITAL_COMMUNITY): Payer: Self-pay

## 2023-09-17 NOTE — Telephone Encounter (Signed)
 Spoke with patient to discuss upcoming procedure.   Labs: completed.   Any recent signs of acute illness or been started on antibiotics? No Any new medications started? No Any medications to hold? Hold Jardiance  for 3 days prior to your procedure- last dose on August 16. Hold both metoprolol  tartrate and metoprolol  succinate for 2 days prior to your procedure- last dose August 17.  Medication instructions:  On the morning of your procedure DO NOT take any medication.or the procedure may be rescheduled. Nothing to eat or drink after midnight prior to your procedure.  Confirmed patient is scheduled for SVT Ablation on Wednesday, August 20 with Dr. Danelle Birmingham. Instructed patient to arrive at the Main Entrance A at [facility]: [address] [place], [place] [address] and check in at Admitting at 6:30 AM.  Advised of plan to go home the same day and will only stay overnight if medically necessary. You MUST have a responsible adult to drive you home and MUST be with you the first 24 hours after you arrive home or your procedure could be cancelled.  Patient verbalized understanding to all instructions provided and agreed to proceed with procedure.

## 2023-09-24 NOTE — Pre-Procedure Instructions (Signed)
Instructed patient on the following items: Arrival time 0615 Nothing to eat or drink after midnight No meds AM of procedure Responsible person to drive you home and stay with you for 24 hrs

## 2023-09-25 ENCOUNTER — Ambulatory Visit (HOSPITAL_COMMUNITY)
Admission: RE | Admit: 2023-09-25 | Discharge: 2023-09-25 | Disposition: A | Discharge status: Home / Self Care | Attending: Internal Medicine | Admitting: Internal Medicine

## 2023-09-25 ENCOUNTER — Encounter (HOSPITAL_COMMUNITY)
Admission: RE | Disposition: A | Discharge status: Home / Self Care | Payer: Self-pay | Source: Home / Self Care | Attending: Internal Medicine

## 2023-09-25 ENCOUNTER — Other Ambulatory Visit: Payer: Self-pay

## 2023-09-25 ENCOUNTER — Ambulatory Visit: Admitting: Cardiology

## 2023-09-25 ENCOUNTER — Encounter (HOSPITAL_COMMUNITY): Payer: Self-pay | Admitting: Internal Medicine

## 2023-09-25 DIAGNOSIS — E66813 Obesity, class 3: Secondary | ICD-10-CM | POA: Diagnosis not present

## 2023-09-25 DIAGNOSIS — Z87891 Personal history of nicotine dependence: Secondary | ICD-10-CM | POA: Insufficient documentation

## 2023-09-25 DIAGNOSIS — I471 Supraventricular tachycardia, unspecified: Secondary | ICD-10-CM | POA: Diagnosis present

## 2023-09-25 DIAGNOSIS — Z6836 Body mass index (BMI) 36.0-36.9, adult: Secondary | ICD-10-CM | POA: Insufficient documentation

## 2023-09-25 HISTORY — PX: SVT ABLATION: EP1225

## 2023-09-25 SURGERY — SVT ABLATION

## 2023-09-25 MED ORDER — MIDAZOLAM HCL 5 MG/5ML IJ SOLN
INTRAMUSCULAR | Status: DC | PRN
  Administered 2023-09-25: 2 mg via INTRAVENOUS

## 2023-09-25 MED ORDER — SODIUM CHLORIDE 0.9% FLUSH: 3.0000 mL | Freq: Two times a day (BID) | INTRAVENOUS | Status: DC

## 2023-09-25 MED ORDER — SODIUM CHLORIDE 0.9% FLUSH: 3.0000 mL | INTRAVENOUS | Status: DC | PRN

## 2023-09-25 MED ORDER — SODIUM CHLORIDE 0.9 % IV SOLN: 250.0000 mL | INTRAVENOUS | Status: DC | PRN

## 2023-09-25 MED ORDER — BUPIVACAINE HCL (PF) 0.25 % IJ SOLN
INTRAMUSCULAR | Status: DC | PRN
  Administered 2023-09-25: 20 mL

## 2023-09-25 MED ORDER — SODIUM CHLORIDE 0.9 % IV SOLN: INTRAVENOUS | Status: DC

## 2023-09-25 MED ORDER — HEPARIN (PORCINE) IN NACL 1000-0.9 UT/500ML-% IV SOLN
INTRAVENOUS | Status: DC | PRN
  Administered 2023-09-25: 500 mL

## 2023-09-25 MED ORDER — ACETAMINOPHEN 325 MG PO TABS: 650.0000 mg | ORAL_TABLET | ORAL | Status: DC | PRN

## 2023-09-25 MED ORDER — ONDANSETRON HCL 4 MG/2ML IJ SOLN: 4.0000 mg | Freq: Four times a day (QID) | INTRAMUSCULAR | Status: DC | PRN

## 2023-09-25 MED ORDER — BUPIVACAINE HCL (PF) 0.25 % IJ SOLN
INTRAMUSCULAR | Status: AC
  Filled 2023-09-25: qty 30

## 2023-09-25 MED ORDER — FENTANYL CITRATE (PF) 100 MCG/2ML IJ SOLN
INTRAMUSCULAR | Status: DC | PRN
  Administered 2023-09-25: 25 ug via INTRAVENOUS

## 2023-09-25 MED ORDER — MIDAZOLAM HCL 5 MG/5ML IJ SOLN
INTRAMUSCULAR | Status: AC
  Filled 2023-09-25: qty 5

## 2023-09-25 MED ORDER — FENTANYL CITRATE (PF) 100 MCG/2ML IJ SOLN
INTRAMUSCULAR | Status: AC
  Filled 2023-09-25: qty 2

## 2023-09-25 SURGICAL SUPPLY — 10 items
CATH EZ STEER NAV 4MM D-F CUR (ABLATOR) IMPLANT
CATH JOSEPH QUAD ALLRED 6F REP (CATHETERS) IMPLANT
CATH WEBSTER BI DIR CS D-F CRV (CATHETERS) IMPLANT
MAT PREVALON FULL STRYKER (MISCELLANEOUS) IMPLANT
PACK EP LF (CUSTOM PROCEDURE TRAY) ×1 IMPLANT
PAD DEFIB RADIO PHYSIO CONN (PAD) ×1 IMPLANT
PATCH CARTO3 (PAD) IMPLANT
SHEATH PINNACLE 6F 10CM (SHEATH) IMPLANT
SHEATH PINNACLE 7F 10CM (SHEATH) IMPLANT
SHEATH PINNACLE 8F 10CM (SHEATH) IMPLANT

## 2023-09-25 NOTE — Progress Notes (Signed)
 Site area: 6, 7, 34F right femoral venous sheaths Site Prior to Removal:  Level 0 Pressure Applied For: 20 minutes Manual:   yes Patient Status During Pull:  stable Post Pull Site:  Level 0 Post Pull Instructions Given:  yes Post Pull Pulses Present:  right DP palpable Dressing Applied:  gauze and tegaderm Bedrest begins @ 1010 Comments:

## 2023-09-25 NOTE — Discharge Instructions (Signed)

## 2023-09-25 NOTE — H&P (Signed)
 HPI Mr. Siler returns today for followup. He is a pleasan 63 yo man with incessant SVT who was hospitalized a couple of months ago. He subsequently had a single episode of atrial fib. He has done well in the interim though he has continue to have SVT with regular episodes around 130/min. Before he was on meds his rates were in the 160's. These are adenosine  sensitive. The patient wore a cardiac monitor which showed hours of SVT but no atrial fib.  Allergies       Allergies  Allergen Reactions   Banana Itching   Sunflower Oil Itching      Seeds not oil                Current Outpatient Medications  Medication Sig Dispense Refill   ALPRAZolam  (XANAX ) 0.5 MG tablet Take 0.5 mg by mouth 2 (two) times daily as needed.       amLODipine  (NORVASC ) 10 MG tablet Take 1 tablet (10 mg total) by mouth daily. 90 tablet 3   apixaban  (ELIQUIS ) 5 MG TABS tablet Take 1 tablet (5 mg total) by mouth 2 (two) times daily. 60 tablet 11   Cholecalciferol (VITAMIN D3) 125 MCG (5000 UT) CAPS Take 1 capsule (5,000 Units total) by mouth daily. 30 capsule 11   cyanocobalamin  (VITAMIN B12) 500 MCG tablet Take 1 tablet (500 mcg total) by mouth daily. 90 tablet 0   empagliflozin  (JARDIANCE ) 10 MG TABS tablet Take 1 tablet (10 mg total) by mouth daily before breakfast. 30 tablet 11   ezetimibe  (ZETIA ) 10 MG tablet Take 1 tablet (10 mg total) by mouth daily. 90 tablet 3   fluticasone (FLONASE) 50 MCG/ACT nasal spray Place 1 spray into both nostrils daily as needed for allergies.       hydrALAZINE  (APRESOLINE ) 25 MG tablet Take 1 tablet (25 mg total) by mouth every 8 (eight) hours. 90 tablet 11   metoprolol  succinate (TOPROL -XL) 100 MG 24 hr tablet Take 1 tablet (100 mg total) by mouth daily. Take with or immediately following a meal. 90 tablet 3   Metoprolol  Tartrate 37.5 MG TABS Take 1 tablet (37.5 mg total) by mouth daily as needed (high heart rate). 30 tablet 11   Multiple Vitamin (MULTIVITAMIN) tablet  Take 1 tablet by mouth daily.       pantoprazole  (PROTONIX ) 40 MG tablet Take 1 tablet (40 mg total) by mouth daily. 30 tablet 11   rosuvastatin  (CRESTOR ) 40 MG tablet Take 1 tablet (40 mg total) by mouth daily. 30 tablet 6   sacubitril -valsartan  (ENTRESTO ) 97-103 MG Take 1 tablet by mouth 2 (two) times daily. 180 tablet 3   spironolactone  (ALDACTONE ) 25 MG tablet Take 1 tablet (25 mg total) by mouth daily. 90 tablet 1   torsemide  (DEMADEX ) 20 MG tablet Take 1 tablet (20 mg total) by mouth daily as needed. 90 tablet 3      No current facility-administered medications for this visit.              Past Medical History:  Diagnosis Date   CAD (coronary artery disease)      DES to mid LAD April 2022   Essential hypertension     GERD (gastroesophageal reflux disease)     History of cardiomyopathy     OSA on CPAP     PSVT (paroxysmal supraventricular tachycardia) (HCC)     Stroke (HCC)  ROS:    All systems reviewed and negative except as noted in the HPI.          Past Surgical History:  Procedure Laterality Date   CORONARY STENT INTERVENTION N/A 05/20/2020    Procedure: CORONARY STENT INTERVENTION;  Surgeon: Verlin Lonni BIRCH, MD;  Location: [facility] INVASIVE CV LAB;  Service: Cardiovascular;  Laterality: N/A;   LEFT HEART CATH AND CORONARY ANGIOGRAPHY N/A 01/31/2022    Procedure: LEFT HEART CATH AND CORONARY ANGIOGRAPHY;  Surgeon: Wonda Sharper, MD;  Location: [facility] INVASIVE CV LAB;  Service: Cardiovascular;  Laterality: N/A;   RIGHT/LEFT HEART CATH AND CORONARY ANGIOGRAPHY N/A 05/20/2020    Procedure: RIGHT/LEFT HEART CATH AND CORONARY ANGIOGRAPHY;  Surgeon: Cherrie Toribio SAUNDERS, MD;  Location: [facility] INVASIVE CV LAB;  Service: Cardiovascular;  Laterality: N/A;   UMBILICAL HERNIA REPAIR                     Family History  Problem Relation Age of Onset   Arrhythmia Mother     Heart attack Father     Arrhythmia Sister          a-fib   Arrhythmia Brother      Anesthesia problems Neg Hx     Hypotension Neg Hx     Malignant hyperthermia Neg Hx     Pseudochol deficiency Neg Hx              Social History         Socioeconomic History   Marital status: Married      Spouse name: Nena Murray   Number of children: 2   Years of education: Not on file   Highest education level: High school graduate  Occupational History   Occupation: Tax inspector      Employer: [employer]  Tobacco Use   Smoking status: Former      Current packs/day: 0.00      Average packs/day: 2.0 packs/day for 25.0 years (50.0 ttl pk-yrs)      Types: Cigarettes      Start date: 01/26/1983      Quit date: 01/26/2008      Years since quitting: 15.5   Smokeless tobacco: Never  Vaping Use   Vaping status: Never Used  Substance and Sexual Activity   Alcohol use: Never   Drug use: No   Sexual activity: Yes      Birth control/protection: None  Other Topics Concern   Not on file  Social History Narrative   Not on file    Social Drivers of Health        Financial Resource Strain: Low Risk  (05/06/2020)    Overall Financial Resource Strain (CARDIA)     Difficulty of Paying Living Expenses: Not very hard  Food Insecurity: No Food Insecurity (07/05/2023)    Hunger Vital Sign     Worried About Running Out of Food in the Last Year: Never true     Ran Out of Food in the Last Year: Never true  Transportation Needs: No Transportation Needs (07/05/2023)    PRAPARE - Therapist, art (Medical): No     Lack of Transportation (Non-Medical): No  Physical Activity: Not on file  Stress: Not on file  Social Connections: Not on file  Intimate Partner Violence: Not At Risk (07/05/2023)    Humiliation, Afraid, Rape, and Kick questionnaire     Fear of Current or Ex-Partner: No     Emotionally Abused:  No     Physically Abused: No     Sexually Abused: No        BP 138/72   Pulse 63   Ht 6' (1.829 m)   Wt 272 lb 4.8 oz (123.5 kg)   SpO2 98%    BMI 36.93 kg/m    Physical Exam:   Obese appearing NAD HEENT: Unremarkable Neck:  No JVD, no thyromegally Lymphatics:  No adenopathy Back:  No CVA tenderness Lungs:  Clear with no wheezes HEART:  Regular rate rhythm, no murmurs, no rubs, no clicks Abd:  soft, positive bowel sounds, no organomegally, no rebound, no guarding Ext:  2 plus pulses, no edema, no cyanosis, no clubbing Skin:  No rashes no nodules Neuro:  CN II through XII intact, motor grossly intact   Assess/Plan: SVT - he continues to have symptoms. I have offered him catheter ablation. We discussed afib ablation as well. He has only had the single episode of atrial fib and is not a particularly good candidate for PVI with significant obesity and sleep apnea. I have offered him catheter ablation and then watchful waiting. The risks/benefits/goals/expectations of SVT were reviewed and he wishes to proceed.  Afib - I have strongly encouraged him to lose weight. As his afib has only occurred in the setting of prolonged SVT, I'll hold off on PVI and would consider following. Obesity - I strongly encouraged the patient to work on weight loss after his ablation. He admits to being sedentary with all of the SVT.   Danelle Clyde Upshaw,MD

## 2023-09-26 ENCOUNTER — Telehealth (HOSPITAL_COMMUNITY): Payer: Self-pay

## 2023-09-26 ENCOUNTER — Ambulatory Visit: Admitting: Cardiology

## 2023-09-26 NOTE — Telephone Encounter (Signed)
 Spoke with patient to complete post procedure follow up call.  Patient reports no complications with groin sites.   Instructions reviewed with patient:  Remove large bandage at puncture site after 24 hours. It is normal to have bruising, tenderness, mild swelling, and a pea or marble sized lump/knot at the groin site which can take up to three months to resolve.  Get help right away if you notice sudden swelling at the puncture site.  Check your puncture site every day for signs of infection: fever, redness, swelling, pus drainage, warmth, foul odor or excessive pain. If this occurs, please call the office at [phone], to speak with the nurse. Get help right away if your puncture site is bleeding and the bleeding does not stop after applying firm pressure to the area.  You may continue to have skipped beats during the first several months after your procedure.  It is very important not to miss any doses of your blood thinner Eliquis .   You will follow up with the APP on 10/25/23.   Patient verbalized understanding to all instructions provided.

## 2023-10-25 ENCOUNTER — Encounter: Payer: Self-pay | Admitting: Student

## 2023-10-25 ENCOUNTER — Ambulatory Visit: Attending: Student | Admitting: Student

## 2023-10-25 VITALS — BP 134/82 | HR 61 | Ht 72.0 in | Wt 275.4 lb

## 2023-10-25 DIAGNOSIS — I4891 Unspecified atrial fibrillation: Secondary | ICD-10-CM | POA: Diagnosis not present

## 2023-10-25 DIAGNOSIS — I25119 Atherosclerotic heart disease of native coronary artery with unspecified angina pectoris: Secondary | ICD-10-CM | POA: Diagnosis not present

## 2023-10-25 DIAGNOSIS — E782 Mixed hyperlipidemia: Secondary | ICD-10-CM

## 2023-10-25 DIAGNOSIS — I471 Supraventricular tachycardia, unspecified: Secondary | ICD-10-CM | POA: Diagnosis not present

## 2023-10-25 DIAGNOSIS — I5032 Chronic diastolic (congestive) heart failure: Secondary | ICD-10-CM

## 2023-10-25 DIAGNOSIS — I10 Essential (primary) hypertension: Secondary | ICD-10-CM

## 2023-10-25 NOTE — Progress Notes (Signed)
  Electrophysiology Office Note:   Date:  10/25/2023  ID:  Dean Wilson, DOB 1960-08-21, MRN [id number]  Primary Cardiologist: Jayson Sierras, MD Electrophysiologist: Danelle Birmingham, MD   Electrophysiologist:  Danelle Birmingham, MD      History of Present Illness:   Dean Wilson is a 63 y.o. male with h/o CAD, HF with recovered EF, CKD, HTN, OSA and SVT s/p AVNRT ablation seen today for routine electrophysiology follow-up s/p Ablation.  Since last being seen in our clinic the patient reports doing well. No breakthrough arrhythmia of which he is aware. Rare, solitary palpitations but none sustained. Otherwise, he denies chest pain, palpitations, PND, orthopnea, nausea, vomiting, dizziness, syncope, edema, weight gain, or early satiety.  Has mild dyspnea, but hasn't been as active and has gained some wait since his SVT started. Trying to get back into it now after ablation  Review of systems complete and found to be negative unless listed in HPI.   EP Information / Studies Reviewed:    EKG is ordered today. Personal review as below.  EKG Interpretation Date/Time:  Friday October 25 2023 12:07:40 EDT Ventricular Rate:  61 PR Interval:  176 QRS Duration:  96 QT Interval:  430 QTC Calculation: 432 R Axis:   78  Text Interpretation: Normal sinus rhythm Normal ECG When compared with ECG of 25-Sep-2023 06:41, Premature atrial complexes are no longer Present Confirmed by Lesia Ozell [phone]) on 10/25/2023 12:12:52 PM    Arrhythmia/Device History No specialty comments available.   Physical Exam:   VS:  BP 134/82 (BP Location: Left Arm, Patient Position: Sitting, Cuff Size: Large)   Pulse 61   Ht 6' (1.829 m)   Wt 275 lb 6.4 oz (124.9 kg)   SpO2 96%   BMI 37.35 kg/m    Wt Readings from Last 3 Encounters:  10/25/23 275 lb 6.4 oz (124.9 kg)  09/25/23 265 lb (120.2 kg)  08/16/23 272 lb 4.8 oz (123.5 kg)     GEN: No acute distress NECK: No JVD; No carotid bruits CARDIAC:  Regular rate and rhythm, no murmurs, rubs, gallops RESPIRATORY:  Clear to auscultation without rales, wheezing or rhonchi  ABDOMEN: Soft, non-tender, non-distended EXTREMITIES:  No edema; No deformity   ASSESSMENT AND PLAN:    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   Paroxysmal AF Only occurred in the setting of prolonged AF.  Continue eliquis  for CHA2DS2/VASc of at least 5 with h/o CVA May not need PVI unless he has more outside of his SVT.    HFrecEF Continue GDMT Echo 03/2023 55-60%  HTN Stable on current regimen   CAD No s/s of ischemia.      Follow up with Dr. Nancey in 6 months  Signed, Ozell Prentice Lesia, PA-C

## 2023-10-25 NOTE — Patient Instructions (Signed)
 Medication Instructions:  Stop Metoprolol  Tartrate 37.5 mg (Lopressor )  *If you need a refill on your cardiac medications before your next appointment, please call your pharmacy*  Lab Work: NONE ordered at this time of appointment   Testing/Procedures: NONE ordered at this time of appointment   Follow-Up: At [facility], you and your health needs are our priority.  As part of our continuing mission to provide you with exceptional heart care, our providers are all part of one team.  This team includes your primary Cardiologist (physician) and Advanced Practice Providers or APPs (Physician Assistants and Nurse Practitioners) who all work together to provide you with the care you need, when you need it.  Your next appointment:   6 month(s)  Provider:   Dr. Nancey [facility] OFFICE)  We recommend signing up for the patient portal called MyChart.  Sign up information is provided on this After Visit Summary.  MyChart is used to connect with patients for Virtual Visits (Telemedicine).  Patients are able to view lab/test results, encounter notes, upcoming appointments, etc.  Non-urgent messages can be sent to your provider as well.   To learn more about what you can do with MyChart, go to [email or web address].

## 2023-11-27 ENCOUNTER — Encounter: Payer: Self-pay | Admitting: Cardiology

## 2023-11-27 ENCOUNTER — Ambulatory Visit: Attending: Cardiology | Admitting: Cardiology

## 2023-11-27 VITALS — BP 122/82 | HR 76 | Ht 72.0 in | Wt 273.2 lb

## 2023-11-27 DIAGNOSIS — I471 Supraventricular tachycardia, unspecified: Secondary | ICD-10-CM

## 2023-11-27 DIAGNOSIS — I25119 Atherosclerotic heart disease of native coronary artery with unspecified angina pectoris: Secondary | ICD-10-CM

## 2023-11-27 DIAGNOSIS — E782 Mixed hyperlipidemia: Secondary | ICD-10-CM | POA: Diagnosis not present

## 2023-11-27 DIAGNOSIS — I5020 Unspecified systolic (congestive) heart failure: Secondary | ICD-10-CM

## 2023-11-27 DIAGNOSIS — I10 Essential (primary) hypertension: Secondary | ICD-10-CM

## 2023-11-27 MED ORDER — METOPROLOL SUCCINATE ER 50 MG PO TB24: 50.0000 mg | ORAL_TABLET | Freq: Every day | ORAL | 3 refills | Status: AC

## 2023-11-27 NOTE — Patient Instructions (Addendum)
 Medication Instructions:   DECREASE Toprol  XL to 50 mg daily  Labwork: None today  Testing/Procedures: None today  Follow-Up: 6 months  Any Other Special Instructions Will Be Listed Below (If Applicable).  If you need a refill on your cardiac medications before your next appointment, please call your pharmacy.

## 2023-11-27 NOTE — Progress Notes (Signed)
 Cardiology Office Note  Date: 11/27/2023   ID: Dean Wilson 1960-12-25, MRN [id number]  History of Present Illness: Dean Wilson is a 63 y.o. male last seen in the EP clinic in September, I reviewed the note as well as interval visits since our last encounter in January.  He is here for a routine visit.  Reports no recurring palpitations, no exertional angina.  He is starting to get back to a regular exercise plan and work on losing weight.  We went over his medications.  Discussed reducing Toprol -XL to 50 mg daily, otherwise no change in GDMT.  He is using Demadex  on an as-needed basis.  No spontaneous bleeding problems on Eliquis .  I reviewed his interval lab work.  Physical Exam: VS:  BP 122/82   Pulse 76   Ht 6' (1.829 m)   Wt 273 lb 3.2 oz (123.9 kg)   SpO2 95%   BMI 37.05 kg/m , BMI Body mass index is 37.05 kg/m.  Wt Readings from Last 3 Encounters:  11/27/23 273 lb 3.2 oz (123.9 kg)  10/25/23 275 lb 6.4 oz (124.9 kg)  09/25/23 265 lb (120.2 kg)    General: Patient appears comfortable at rest. HEENT: Conjunctiva and lids normal. Neck: Supple, no elevated JVP or carotid bruits. Lungs: Clear to auscultation, nonlabored breathing at rest. Cardiac: Regular rate and rhythm, no S3 or significant systolic murmur. Extremities: No pitting edema.  ECG:  An ECG dated 10/25/2023 was personally reviewed today and demonstrated:  Sinus rhythm.  Labwork: 06/14/2023: ALT 16; AST 19 07/05/2023: B Natriuretic Peptide 376.7; TSH 2.047 07/08/2023: Magnesium  2.0 08/30/2023: BUN 21; Creatinine, Ser 1.58; Hemoglobin 16.4; Platelets 176; Potassium 4.7; Sodium 148  July 2025: Cholesterol 146, triglycerides 117, HDL 52, LDL 73  Other Studies Reviewed Today:  Echocardiogram 03/20/2023:  1. Left ventricular ejection fraction, by estimation, is 55 to 60%. The  left ventricle has normal function. The left ventricle has no regional  wall motion abnormalities. There is mild left  ventricular hypertrophy.  Left ventricular diastolic parameters  are consistent with Grade I diastolic dysfunction (impaired relaxation).  Elevated left atrial pressure.   2. Right ventricular systolic function is normal. The right ventricular  size is normal. Tricuspid regurgitation signal is inadequate for assessing  PA pressure.   3. The mitral valve is normal in structure. Trivial mitral valve  regurgitation. No evidence of mitral stenosis.   4. The aortic valve is tricuspid. Aortic valve regurgitation is not  visualized. No aortic stenosis is present.   5. Aortic dilatation noted. 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   Assessment and Plan:  1.  CAD status post DES to the mid LAD in April 2022.  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  No angina at current level of activity including resumption of exercise plan.  Continue Crestor  40 mg daily and Zetia  10 mg daily.   2.  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  Symptomatically stable at this point.  Continue observation.  3.  Paroxysmal atrial fibrillation with CHA2DS2-VASc score of at least 5.  Mainly noted with prior prolonged episodes of SVT.  He continues on Eliquis  5 mg twice daily for stroke prophylaxis.   4.  Primary hypertension.  Blood pressure control is  reasonable today.  Continue Norvasc  10 mg daily and hydralazine  25 mg 3 times a day.  5.  OSA on CPAP.  He reports compliance with treatment.   6.  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  Symptomatically stable with NYHA class II dyspnea.  Decrease Toprol -XL to 50 mg daily, continue Jardiance  10 mg daily, Entresto  97/103 mg twice daily, Aldactone  12.5 mg daily, and as needed Demadex  20 mg tablets.  7.   Mixed hyperlipidemia, LDL 73 with HDL 52 in July.  Continue Zetia  10 mg daily and Crestor  5 mg daily.  Disposition:  Follow up 6 months.  Signed, [name], M.D., F.A.C.C. [facility]
